# Patient Record
Sex: Female | Born: 1940
Health system: Southern US, Community
[De-identification: ages and names within clinical notes are randomized; demographics above are authoritative.]

## PROBLEM LIST (undated history)

## (undated) DIAGNOSIS — R430 Anosmia: Secondary | ICD-10-CM

## (undated) DIAGNOSIS — D649 Anemia, unspecified: Secondary | ICD-10-CM

## (undated) DIAGNOSIS — M5136 Other intervertebral disc degeneration, lumbar region: Secondary | ICD-10-CM

## (undated) DIAGNOSIS — R9389 Abnormal findings on diagnostic imaging of other specified body structures: Secondary | ICD-10-CM

## (undated) DIAGNOSIS — M6740 Ganglion, unspecified site: Secondary | ICD-10-CM

## (undated) DIAGNOSIS — I2510 Atherosclerotic heart disease of native coronary artery without angina pectoris: Secondary | ICD-10-CM

## (undated) DIAGNOSIS — Z860100 Personal history of colon polyps, unspecified: Secondary | ICD-10-CM

## (undated) DIAGNOSIS — J302 Other seasonal allergic rhinitis: Secondary | ICD-10-CM

## (undated) DIAGNOSIS — J449 Chronic obstructive pulmonary disease, unspecified: Secondary | ICD-10-CM

## (undated) DIAGNOSIS — M1990 Unspecified osteoarthritis, unspecified site: Secondary | ICD-10-CM

## (undated) DIAGNOSIS — M419 Scoliosis, unspecified: Secondary | ICD-10-CM

## (undated) DIAGNOSIS — K4090 Unilateral inguinal hernia, without obstruction or gangrene, not specified as recurrent: Secondary | ICD-10-CM

## (undated) DIAGNOSIS — E785 Hyperlipidemia, unspecified: Secondary | ICD-10-CM

## (undated) DIAGNOSIS — M5440 Lumbago with sciatica, unspecified side: Secondary | ICD-10-CM

## (undated) DIAGNOSIS — K635 Polyp of colon: Secondary | ICD-10-CM

## (undated) DIAGNOSIS — M4800 Spinal stenosis, site unspecified: Secondary | ICD-10-CM

## (undated) DIAGNOSIS — K5730 Diverticulosis of large intestine without perforation or abscess without bleeding: Secondary | ICD-10-CM

## (undated) DIAGNOSIS — I700 Atherosclerosis of aorta: Secondary | ICD-10-CM

## (undated) DIAGNOSIS — J439 Emphysema, unspecified: Secondary | ICD-10-CM

## (undated) DIAGNOSIS — M51369 Other intervertebral disc degeneration, lumbar region without mention of lumbar back pain or lower extremity pain: Secondary | ICD-10-CM

## (undated) DIAGNOSIS — D692 Other nonthrombocytopenic purpura: Secondary | ICD-10-CM

## (undated) DIAGNOSIS — I83893 Varicose veins of bilateral lower extremities with other complications: Secondary | ICD-10-CM

## (undated) DIAGNOSIS — E7800 Pure hypercholesterolemia, unspecified: Secondary | ICD-10-CM

## (undated) HISTORY — DX: Spinal stenosis, site unspecified: M48.00

## (undated) HISTORY — DX: Other seasonal allergic rhinitis: J30.2

## (undated) HISTORY — DX: Anemia, unspecified: D64.9

## (undated) HISTORY — PX: ABDOMINAL HYSTERECTOMY: SHX81

## (undated) HISTORY — PX: BLEPHAROPLASTY: SUR158

## (undated) HISTORY — DX: Atherosclerotic heart disease of native coronary artery without angina pectoris: I25.10

## (undated) HISTORY — DX: Scoliosis, unspecified: M41.9

## (undated) HISTORY — DX: Emphysema, unspecified: J43.9

## (undated) HISTORY — DX: Anosmia: R43.0

## (undated) HISTORY — DX: Personal history of colon polyps, unspecified: Z86.0100

## (undated) HISTORY — DX: Other nonthrombocytopenic purpura: D69.2

## (undated) HISTORY — DX: Pure hypercholesterolemia, unspecified: E78.00

## (undated) HISTORY — DX: Atherosclerosis of aorta: I70.0

## (undated) HISTORY — DX: Lumbago with sciatica, unspecified side: M54.40

## (undated) HISTORY — DX: Ganglion, unspecified site: M67.40

## (undated) HISTORY — DX: Other intervertebral disc degeneration, lumbar region: M51.36

## (undated) HISTORY — DX: Other intervertebral disc degeneration, lumbar region without mention of lumbar back pain or lower extremity pain: M51.369

## (undated) HISTORY — DX: Unilateral inguinal hernia, without obstruction or gangrene, not specified as recurrent: K40.90

## (undated) HISTORY — DX: Personal history of colonic polyps: Z86.010

## (undated) HISTORY — DX: Abnormal findings on diagnostic imaging of other specified body structures: R93.89

## (undated) HISTORY — DX: Chronic obstructive pulmonary disease, unspecified: J44.9

## (undated) HISTORY — DX: Diverticulosis of large intestine without perforation or abscess without bleeding: K57.30

## (undated) HISTORY — PX: TONSILLECTOMY: SUR1361

## (undated) HISTORY — DX: Polyp of colon: K63.5

## (undated) HISTORY — DX: Hyperlipidemia, unspecified: E78.5

## (undated) HISTORY — PX: APPENDECTOMY: SHX54

## (undated) HISTORY — DX: Varicose veins of bilateral lower extremities with other complications: I83.893

---

## 2002-09-01 DIAGNOSIS — C4491 Basal cell carcinoma of skin, unspecified: Secondary | ICD-10-CM

## 2002-09-01 HISTORY — DX: Basal cell carcinoma of skin, unspecified: C44.91

## 2002-12-30 ENCOUNTER — Ambulatory Visit (HOSPITAL_COMMUNITY): Admission: RE | Admit: 2002-12-30 | Discharge: 2002-12-30 | Payer: Self-pay | Admitting: Gastroenterology

## 2002-12-30 ENCOUNTER — Encounter (INDEPENDENT_AMBULATORY_CARE_PROVIDER_SITE_OTHER): Payer: Self-pay | Admitting: Specialist

## 2003-05-17 ENCOUNTER — Emergency Department (HOSPITAL_COMMUNITY): Admission: EM | Admit: 2003-05-17 | Discharge: 2003-05-17 | Payer: Self-pay | Admitting: Emergency Medicine

## 2005-03-09 ENCOUNTER — Emergency Department (HOSPITAL_COMMUNITY): Admission: EM | Admit: 2005-03-09 | Discharge: 2005-03-09 | Payer: Self-pay | Admitting: Emergency Medicine

## 2005-03-14 ENCOUNTER — Ambulatory Visit (HOSPITAL_COMMUNITY): Admission: RE | Admit: 2005-03-14 | Discharge: 2005-03-14 | Payer: Self-pay | Admitting: Orthopedic Surgery

## 2005-03-14 ENCOUNTER — Ambulatory Visit (HOSPITAL_BASED_OUTPATIENT_CLINIC_OR_DEPARTMENT_OTHER): Admission: RE | Admit: 2005-03-14 | Discharge: 2005-03-14 | Payer: Self-pay | Admitting: Orthopedic Surgery

## 2005-10-23 DIAGNOSIS — C4492 Squamous cell carcinoma of skin, unspecified: Secondary | ICD-10-CM

## 2005-10-23 HISTORY — DX: Squamous cell carcinoma of skin, unspecified: C44.92

## 2007-07-29 ENCOUNTER — Encounter: Payer: Self-pay | Admitting: Interventional Cardiology

## 2009-02-08 ENCOUNTER — Encounter: Admission: RE | Admit: 2009-02-08 | Discharge: 2009-04-29 | Payer: Self-pay | Admitting: Family Medicine

## 2009-11-17 ENCOUNTER — Encounter: Payer: Self-pay | Admitting: Interventional Cardiology

## 2009-12-01 ENCOUNTER — Encounter: Payer: Self-pay | Admitting: Interventional Cardiology

## 2009-12-02 ENCOUNTER — Encounter: Admission: RE | Admit: 2009-12-02 | Discharge: 2009-12-23 | Payer: Self-pay | Admitting: Family Medicine

## 2009-12-25 ENCOUNTER — Encounter: Admission: RE | Admit: 2009-12-25 | Discharge: 2010-02-01 | Payer: Self-pay | Admitting: Family Medicine

## 2010-01-11 ENCOUNTER — Encounter: Payer: Self-pay | Admitting: Interventional Cardiology

## 2011-01-23 ENCOUNTER — Other Ambulatory Visit: Payer: Self-pay

## 2011-01-23 DIAGNOSIS — C4492 Squamous cell carcinoma of skin, unspecified: Secondary | ICD-10-CM

## 2011-01-23 HISTORY — DX: Squamous cell carcinoma of skin, unspecified: C44.92

## 2011-04-20 ENCOUNTER — Other Ambulatory Visit: Payer: Self-pay

## 2011-05-12 NOTE — Op Note (Signed)
NAMEJAMESE, TRAUGER                  ACCOUNT NO.:  [id number]   MEDICAL RECORD NO.:  [id number]          PATIENT TYPE:  AMB   LOCATION:  DSC                          FACILITY:  [facility]   PHYSICIAN:  Katy Fitch. Sypher Montez Hageman., M.D.DATE OF BIRTH:  08-16-1941   DATE OF PROCEDURE:  03/14/2005  DATE OF DISCHARGE:                                 OPERATIVE REPORT   PREOPERATIVE DIAGNOSES:  Comminuted, impacted, angulated fracture of right  distal radius, closed.   POSTOPERATIVE DIAGNOSES:  Comminuted, impacted, angulated fracture of right  distal radius, closed.   OPERATION:  Open reduction and internal fixation of a severely comminuted  right distal radius fracture utilizing a seven-peg DVR volar plate system.   SURGEON:  Katy Fitch. Sypher, M.D.   ASSISTANT:  Marveen Reeks. Dasnoit, P.A.-C.   ANESTHESIA:  General by LMA.   SUPERVISING ANESTHESIOLOGIST:  Bedelia Person, M.D.   INDICATIONS:  Ms. Karen Bonilla is a 70 year old woman referred by Dr. Clarene Essex of the colon emergency room staff, for evaluation and management of a  severely- comminuted fracture of the right distal radius sustained on March 10, 2005.   She was seen at the [facility] Emergency Room  where her  fracture was splinted with a sugar-tong splint. She subsequently returned to  our office for follow-up evaluation and management on March 13, 2005, and at  that time was noted to have intact sensibility in her median,  ulnar and  radial distribution. Her splint was not particularly comfortable, therefore  was changed to a custom sugar-tong splint, and arrangements were made for an  open reduction and internal fixation of her fracture at this time.   INFORMED CONSENT:  After an informed consent, during which we discussed the  potential risks and benefits of a volar plate application, including  infection, anesthetic complication and possible failure to heal. She  proceeds with an open reduction and external fixation  of her fracture at  this time.   DESCRIPTION OF PROCEDURE:  Ms. Karen Bonilla is brought to the operating room  and placed in the supine position upon the table.  Following the induction  of general anesthesia by LMA, the right arm was prepped with Betadine soap  and solution and  sterilely draped.  Then 1 gram of Ancef was administered  as IV prophylactic antibiotic.   The procedure commenced with an extended DVR incision paralleling the path  of the flexor carpi radialis in the distal forearm. The subcutaneous tissues  are carefully divided, taking care to electrocauterize the  transverse  veins. The radial sensory branches and branch of the lateral antebrachial  cutaneous nerve were carefully preserved.  The fascia at the  floor of the  flexor carpi radialis sheath was incised longitudinally and the flexor  pollicis longus retracted in an ulnar direction.  The interval between the  radial artery and the flexor pollicis longus was developed, elevating the  pronator quadratus and revealing the comminuted fracture of the distal  radius.  Clot and fibrin were removed from the fracture site, followed by a  manual reduction of the fracture fragments in an  anatomic position.  The  seven-peg DVR  plate was selected and placed on the volar surface of the  fracture. Provisional fixation was provided with a 12 mm bicortical screw.  Once the  fracture was aligned in a satisfactory manner, a combination of  threaded pegs and smooth pegs were applied, reducing the fracture  anatomically.  While there was significant comminution of the metaphysis,  the fragments reassembled rather nicely and the comminuted metaphysis should  heal in a satisfactory manner.  No bone graft was utilized.  The construct  was completed by placement of 14 mm, 12 mm, 12 mm and 12 mm screws in the  cortex, securing the plate to the proximal shaft.  AP, lateral and oblique C-  arm images were obtained documenting satisfactory  length of our cortical  screws, as well as satisfactory position of the pegs, avoiding possible  entry into the fourth dorsal compartment or causing difficulty with the  extensor tendons.  The wound was then lavaged with sterile saline, followed  by a repair of the pronator quadratus with mattress sutures of #0 Vicryl  covering the plate, followed by repair of the skin with subdermal sutures of  #3-0 Vicryl, and intradermal #3-0 Prolene with Steri-Strips.  A voluminous  sugar-tong dressing was applied with the forearm in slight supination.  The tourniquet was released, with immediate capillary refill of all fingers  and the thumb. The tourniquet pressure was 230 mmHg. There were no apparent  complications.   DISPOSITION:  For aftercare, Karen Bonilla is provided a prescription for  Dilaudid 2 milligrams, one p.o. q.4-6 hours p.r.n. pain, 30 tablets without  refill. Also Motrin 600 milligrams, one p.o. q.6h. p.r.n. pain, #30 tablets  without refill, and Keflex 5 milligrams, one p.o. q.8h. x4 days as a  prophylactic antibiotic.      RVS/MEDQ  D:  03/14/2005  T:  03/14/2005  Job:  [id number]

## 2011-08-10 ENCOUNTER — Other Ambulatory Visit: Payer: Self-pay | Admitting: Gastroenterology

## 2011-11-15 ENCOUNTER — Ambulatory Visit: Payer: Medicare Other | Attending: Family Medicine | Admitting: Physical Therapy

## 2011-11-15 DIAGNOSIS — IMO0001 Reserved for inherently not codable concepts without codable children: Secondary | ICD-10-CM | POA: Insufficient documentation

## 2011-11-15 DIAGNOSIS — M2560 Stiffness of unspecified joint, not elsewhere classified: Secondary | ICD-10-CM | POA: Insufficient documentation

## 2011-11-15 DIAGNOSIS — M545 Low back pain, unspecified: Secondary | ICD-10-CM | POA: Insufficient documentation

## 2011-11-15 DIAGNOSIS — M25559 Pain in unspecified hip: Secondary | ICD-10-CM | POA: Insufficient documentation

## 2011-11-21 ENCOUNTER — Ambulatory Visit: Payer: Medicare Other | Admitting: Physical Therapy

## 2011-11-27 ENCOUNTER — Ambulatory Visit: Payer: Medicare Other | Attending: Family Medicine | Admitting: Physical Therapy

## 2011-11-27 DIAGNOSIS — M545 Low back pain, unspecified: Secondary | ICD-10-CM | POA: Insufficient documentation

## 2011-11-27 DIAGNOSIS — IMO0001 Reserved for inherently not codable concepts without codable children: Secondary | ICD-10-CM | POA: Insufficient documentation

## 2011-11-27 DIAGNOSIS — M2560 Stiffness of unspecified joint, not elsewhere classified: Secondary | ICD-10-CM | POA: Insufficient documentation

## 2011-11-27 DIAGNOSIS — M25559 Pain in unspecified hip: Secondary | ICD-10-CM | POA: Insufficient documentation

## 2011-11-29 ENCOUNTER — Ambulatory Visit: Payer: Medicare Other

## 2011-12-04 ENCOUNTER — Ambulatory Visit: Payer: Medicare Other

## 2011-12-06 ENCOUNTER — Ambulatory Visit: Payer: Medicare Other

## 2011-12-11 ENCOUNTER — Ambulatory Visit: Payer: Medicare Other

## 2011-12-13 ENCOUNTER — Ambulatory Visit: Payer: Medicare Other

## 2012-01-04 ENCOUNTER — Other Ambulatory Visit: Payer: Self-pay | Admitting: Orthopedic Surgery

## 2012-01-11 ENCOUNTER — Ambulatory Visit (HOSPITAL_BASED_OUTPATIENT_CLINIC_OR_DEPARTMENT_OTHER)
Admission: RE | Admit: 2012-01-11 | Discharge: 2012-01-11 | Disposition: A | Discharge status: Home / Self Care | Payer: Medicare Other | Source: Ambulatory Visit | Attending: Orthopedic Surgery | Admitting: Orthopedic Surgery

## 2012-01-11 ENCOUNTER — Encounter (HOSPITAL_BASED_OUTPATIENT_CLINIC_OR_DEPARTMENT_OTHER)
Admission: RE | Disposition: A | Discharge status: Home / Self Care | Payer: Self-pay | Source: Ambulatory Visit | Attending: Orthopedic Surgery

## 2012-01-11 ENCOUNTER — Encounter (HOSPITAL_BASED_OUTPATIENT_CLINIC_OR_DEPARTMENT_OTHER): Payer: Self-pay | Admitting: Orthopedic Surgery

## 2012-01-11 DIAGNOSIS — M6740 Ganglion, unspecified site: Secondary | ICD-10-CM | POA: Insufficient documentation

## 2012-01-11 DIAGNOSIS — M24049 Loose body in unspecified finger joint(s): Secondary | ICD-10-CM | POA: Insufficient documentation

## 2012-01-11 HISTORY — PX: MASS EXCISION: SHX2000

## 2012-01-11 SURGERY — MINOR EXCISION OF MASS
Anesthesia: LOCAL | Site: Hand | Laterality: Right | Wound class: Clean

## 2012-01-11 MED ORDER — LIDOCAINE HCL 2 % IJ SOLN
INTRAMUSCULAR | Status: DC | PRN
  Administered 2012-01-11: 3.5 mL

## 2012-01-11 MED ORDER — CEPHALEXIN 500 MG PO CAPS: 500.0000 mg | ORAL_CAPSULE | Freq: Three times a day (TID) | ORAL | Status: AC

## 2012-01-11 MED ORDER — HYDROCODONE-ACETAMINOPHEN 5-325 MG PO TABS: ORAL_TABLET | ORAL | Status: AC

## 2012-01-11 SURGICAL SUPPLY — 34 items
BANDAGE ADHESIVE 1X3 (GAUZE/BANDAGES/DRESSINGS) IMPLANT
BLADE SURG 15 STRL LF DISP TIS (BLADE) ×1 IMPLANT
BLADE SURG 15 STRL SS (BLADE) ×1
BNDG COHESIVE 1X5 TAN STRL LF (GAUZE/BANDAGES/DRESSINGS) ×2 IMPLANT
BNDG ELASTIC 2 VLCR STRL LF (GAUZE/BANDAGES/DRESSINGS) IMPLANT
BNDG ESMARK 4X9 LF (GAUZE/BANDAGES/DRESSINGS) IMPLANT
BRUSH SCRUB EZ PLAIN DRY (MISCELLANEOUS) ×2 IMPLANT
CLOTH BEACON ORANGE TIMEOUT ST (SAFETY) ×2 IMPLANT
CORDS BIPOLAR (ELECTRODE) IMPLANT
COVER MAYO STAND STRL (DRAPES) ×2 IMPLANT
CUFF TOURNIQUET SINGLE 18IN (TOURNIQUET CUFF) IMPLANT
DECANTER SPIKE VIAL GLASS SM (MISCELLANEOUS) ×2 IMPLANT
DRAIN PENROSE 1/2X12 LTX STRL (WOUND CARE) ×2 IMPLANT
DRAPE SURG 17X23 STRL (DRAPES) ×2 IMPLANT
GAUZE SPONGE 4X4 12PLY STRL LF (GAUZE/BANDAGES/DRESSINGS) ×4 IMPLANT
GAUZE XEROFORM 1X8 LF (GAUZE/BANDAGES/DRESSINGS) ×2 IMPLANT
GLOVE BIO SURGEON STRL SZ 6.5 (GLOVE) ×4 IMPLANT
GLOVE BIOGEL M STRL SZ7.5 (GLOVE) ×2 IMPLANT
GLOVE ORTHO TXT STRL SZ7.5 (GLOVE) ×2 IMPLANT
GOWN PREVENTION PLUS XLARGE (GOWN DISPOSABLE) ×2 IMPLANT
NEEDLE 27GAX1X1/2 (NEEDLE) ×2 IMPLANT
PACK BASIN DAY SURGERY FS (CUSTOM PROCEDURE TRAY) ×2 IMPLANT
PADDING CAST ABS 4INX4YD NS (CAST SUPPLIES) ×1
PADDING CAST ABS COTTON 4X4 ST (CAST SUPPLIES) ×1 IMPLANT
SPONGE GAUZE 4X4 12PLY (GAUZE/BANDAGES/DRESSINGS) ×2 IMPLANT
STOCKINETTE 4X48 STRL (DRAPES) ×2 IMPLANT
SUT ETHILON 5 0 P 3 18 (SUTURE) ×1
SUT NYLON ETHILON 5-0 P-3 1X18 (SUTURE) ×1 IMPLANT
SYR 3ML 23GX1 SAFETY (SYRINGE) IMPLANT
SYR CONTROL 10ML LL (SYRINGE) ×2 IMPLANT
TOWEL OR 17X24 6PK STRL BLUE (TOWEL DISPOSABLE) ×4 IMPLANT
TRAY DSU PREP LF (CUSTOM PROCEDURE TRAY) ×2 IMPLANT
UNDERPAD 30X30 INCONTINENT (UNDERPADS AND DIAPERS) ×2 IMPLANT
WATER STERILE IRR 1000ML POUR (IV SOLUTION) ×2 IMPLANT

## 2012-01-11 NOTE — Op Note (Signed)
NAME:  Karen Bonilla, Karen Bonilla                       ACCOUNT NO.:  MEDICAL RECORD NO.:  [id number]  LOCATION:                                 FACILITY:  PHYSICIAN:  Katy Fitch. Jung Ingerson, M.D. DATE OF BIRTH:  1941/08/18  DATE OF PROCEDURE:  01/11/2012 DATE OF DISCHARGE:                              OPERATIVE REPORT   PREOPERATIVE DIAGNOSIS:  Large draining mucoid cyst, dorsal ulnar aspect of right index finger distal interphalangeal joint with x-ray evaluation preoperatively documenting intra-articular loose bodies at distal interphalangeal joint and significant degenerative arthritis.  POSTOPERATIVE DIAGNOSIS:  Large draining mucoid cyst, dorsal ulnar aspect of right index finger distal interphalangeal joint with x-ray evaluation preoperatively documenting intra-articular loose bodies at distal interphalangeal joint and significant degenerative arthritis with confirmation of loose bodies with removal of an ostial cartilaginous loose body and several loose osteophyte fragments.  OPERATIONS: 1. Right index finger DIP joint arthrotomy with synovectomy and     removal of loose bodies and marginal osteophytes. 2. Excision of mucoid cyst, dorsal ulnar aspect of right index finger.  SURGEON:  Katy Fitch. Aris Moman, MD  ASSISTANT:  Marveen Reeks Dasnoit, PA-C  ANESTHESIA:  2% lidocaine metacarpal head level block of right index finger.  This was performed as a minor operating room procedure.  ANESTHETIST:  Katy Fitch. Uno Esau, MD.  Total volume of lidocaine 3.5 mL of 2% without epinephrine.  INDICATIONS:  Karen Bonilla is a 71 year old woman referred through the courtesy of Dr. Janalyn Harder and her primary care physician, Camillo Flaming, for evaluation and management of an enlarging draining mass in the dorsal ulnar aspect of right index finger.  This was noted to be a mucoid cyst that has ruptured.  We advised her to avoid rupture possible.  There was no sign of infection.  X-ray of the finger revealed loose  bodies within the DIP joint and advanced degenerative arthritis.  We advised Karen Bonilla to proceed with debridement of the joint and removal of loose bodies.  She knows we cannot change the natural history of her arthritis.  With debridement, we typically can resolve the mucoid cyst.  After informed consent, she was brought to the operating at this time.  PROCEDURE:  Karen Bonilla was brought to room #1 of the [facility] and placed in supine position on the operating table.  Following detailed informed consent and Betadine prep, a 3.5 mL of 2% plain lidocaine were infiltrated at the metacarpal head level in the flexor sheath and around the digital nerves to obtain a digital block.  After 10 minutes, excellent anesthesia was achieved.  The right hand and arm were then prepped with Betadine soap solution and sterilely draped.  Following routine surgical time-out, the right index finger was exsanguinated with a gauze wrap and a 0.5 inch Penrose drain placed over the proximal phalangeal segment as a digital tourniquet.  Procedure commenced with a curvilinear incision incorporating the distal cyst. The cyst was circumferentially dissected and removed.  This had quite a bit of inspissated myxoid material.  An arthrotomy was performed in the dorsal ulnar aspect of the DIP joint. The capsule was resected between the terminal  extensor tendon slip and the ulnar collateral ligament.  A synovectomy of the dorsal aspect of the joint was accomplished followed by removal of a 1.5-mm ostial cartilaginous loose body with rongeur.  A curette was used to remove marginal osteophytes.  There was a loose osteophyte noted at the ulnar base of the distal phalanx.  Thereafter, the wound was thoroughly irrigated with a 19-gauge needle with sterile saline.  The wound was then repaired with mattress suture of 5-0 nylon.  Compression dressing was applied with Xeroflo, sterile gauze, and a Coban  dressing.  There were no apparent complications.  For aftercare, Karen Bonilla was provided prescriptions for Keflex 500 mg 1 p.o. q.8 hours x4 days as a prophylactic antibiotic due to joint entry, also hydrocodone 5 mg 1 p.o. q.4 hours p.r.n. pain, 20 tablets without refill.     Katy Fitch Mykel Mohl, M.D.     RVS/MEDQ  D:  01/11/2012  T:  01/11/2012  Job:  [id number]  cc:   Ria Bush. Jorja Loa, M.D. Dossie Der, MD

## 2012-01-11 NOTE — Op Note (Signed)
OP NOTE DICTATED:  01/11/12 [id number]

## 2012-01-11 NOTE — Brief Op Note (Signed)
01/11/2012  9:39 AM  PATIENT:  Karen Bonilla  71 y.o. female  PRE-OPERATIVE DIAGNOSIS:  mucoid cyst right index with loose bodies in joint  POST-OPERATIVE DIAGNOSIS:  Mucoid cyst with DJD and loose bodies in joint  PROCEDURE:  Procedure(s):DIP JOINT ARTHROTOMY, REMOVAL OF LOOSE BODIES, SYNOVECTOMY AND MUCOID CYST EXCISION RIGHT INDEX FINGER   SURGEON:  Surgeon(s): Wyn Forster., MD  PHYSICIAN ASSISTANT:   ASSISTANTS: Mallory Shirk.A-C    ANESTHESIA:   local  EBL:     BLOOD ADMINISTERED:none  DRAINS: none   LOCAL MEDICATIONS USED:  LIDOCAINE 3.5 CC 2%  SPECIMEN:  No Specimen  DISPOSITION OF SPECIMEN:  N/A  COUNTS:  YES  TOURNIQUET:  * No tourniquets in log *  DICTATION: .Other Dictation: Dictation Number [id number]  PLAN OF CARE: Discharge to home after PACU  PATIENT DISPOSITION:  PACU - hemodynamically stable.

## 2012-01-11 NOTE — H&P (Signed)
  Karen Bonilla is an 71 y.o. female.   Chief Complaint: Mucoid cyst right index finger DIP joint  HPI: Patient is a 71 year old right-hand-dominant female who is referred to [place] for evaluation and treatment of a long-standing mucoid cyst of the DIP joint of the right index finger. She relates no history of trauma to the finger. This has increased and decreased in size over the past several months. She wishes to have this surgically excised.  No past medical history on file.  No past surgical history on file.  No family history on file. Social History:  does not have a smoking history on file. She does not have any smokeless tobacco history on file. Her alcohol and drug histories not on file.  Allergies: Allergies not on file  No current facility-administered medications on file as of .   No current outpatient prescriptions on file as of .    No results found for this or any previous visit (from the past 48 hour(s)).  No results found.   Pertinent items are noted in HPI.  There were no vitals taken for this visit.  General appearance: alert Head: Normocephalic, without obvious abnormality Neck: supple, symmetrical, trachea midline Resp: clear to auscultation bilaterally Cardio: regular rate and rhythm, S1, S2 normal, no murmur, click, rub or gallop GI: normal findings: bowel sounds normal Extremities: Right index finger reveals a mucoid cyst on the dorsum of the index finger at the DIP joint. There is mild grooving of the nail plate this does not appear to be infected. She has good motion of her PIP and DIP joint. The DIP joint is moderately tender to palpation. Pulses: 2+ and symmetric Skin: normal Neurologic: Grossly normal    Assessment/Plan impression: Right index finger DIP joint mucoid cyst  Plan: Patient to be taken to the operating room to undergo DIP joint mucoid cyst excision and debridement of the DIP joint. This will be performed under local anesthesia. The  procedure risks benefits and postoperative course were discussed with the patient at length and she was in agreement with this plan.  DASNOIT,Huck Ashworth J 01/11/2012, 7:52 AM   H&P documentation: 01/11/2012  -History and Physical Reviewed  -Patient has been re-examined  -No change in the plan of care  Wyn Forster, MD

## 2012-01-12 ENCOUNTER — Encounter (HOSPITAL_BASED_OUTPATIENT_CLINIC_OR_DEPARTMENT_OTHER): Payer: Self-pay | Admitting: Orthopedic Surgery

## 2012-01-31 ENCOUNTER — Other Ambulatory Visit: Payer: Self-pay | Admitting: Physician Assistant

## 2012-02-19 ENCOUNTER — Other Ambulatory Visit: Payer: Self-pay

## 2012-02-19 DIAGNOSIS — I83893 Varicose veins of bilateral lower extremities with other complications: Secondary | ICD-10-CM

## 2012-04-09 ENCOUNTER — Encounter: Payer: Medicare Other | Admitting: Vascular Surgery

## 2012-05-17 ENCOUNTER — Encounter: Payer: Self-pay | Admitting: Vascular Surgery

## 2012-05-21 ENCOUNTER — Ambulatory Visit (INDEPENDENT_AMBULATORY_CARE_PROVIDER_SITE_OTHER): Payer: Medicare Other | Admitting: Vascular Surgery

## 2012-05-21 ENCOUNTER — Encounter: Payer: Self-pay | Admitting: Vascular Surgery

## 2012-05-21 VITALS — BP 132/72 | HR 54 | Resp 16 | Ht 67.0 in | Wt 163.0 lb

## 2012-05-21 DIAGNOSIS — I83893 Varicose veins of bilateral lower extremities with other complications: Secondary | ICD-10-CM

## 2012-05-21 DIAGNOSIS — M79609 Pain in unspecified limb: Secondary | ICD-10-CM

## 2012-05-21 HISTORY — DX: Varicose veins of bilateral lower extremities with other complications: I83.893

## 2012-05-21 NOTE — Progress Notes (Signed)
Subjective:     Patient ID: Karen Bonilla, female   DOB: 1941/02/19, 71 y.o.   MRN: [id number]  HPI this 71 year old female is evaluated today for symptomatic varicose veins in the right lower extremity. Over the past year she has noted increasing bulging varicosities in the right posterior calf and medial calf area. She has also noticed swelling in the ankle as the day progresses. She does not wear elastic compression stockings nor elevate her legs on a regular basis. She does take ibuprofen for other reasons. She has no history of DVT, thrombophlebitis, or stasis ulcers but has had a bleeding episode in the right lateral lower leg following shaving which resulted in quite brisk bleeding.  History reviewed. No pertinent past medical history.  History  Substance Use Topics  . Smoking status: Current Everyday Smoker -- 1.0 packs/day    Types: Cigarettes  . Smokeless tobacco: Not on file  . Alcohol Use: 3.0 oz/week    5 Glasses of wine per week    History reviewed. No pertinent family history.  No Known Allergies  No current outpatient prescriptions on file.  BP 132/72  Pulse 54  Resp 16  Ht 5\' 7"  (1.702 m)  Wt 163 lb (73.936 kg)  BMI 25.53 kg/m2  Body mass index is 25.53 kg/(m^2).           Review of Systems denies chest pain, dyspnea on exertion, PND, orthopnea,, phthisis, claudication     Objective:   Physical Exam blood pressure 130/72 heart rate 84 respirations 16 Gen.-alert and oriented x3 in no apparent distress HEENT normal for age Lungs no rhonchi or wheezing Cardiovascular regular rhythm no murmurs carotid pulses 3+ palpable no bruits audible Abdomen soft nontender no palpable masses Musculoskeletal free of  major deformities Skin clear -no rashes Neurologic normal Lower extremities 3+ femoral and dorsalis pedis pulses palpable bilaterally with no edema in the left leg 1+ edema right ankle. She has multiple bulging varicosities in the proximal medial calf  area particularly posteriorly as well as large patches of reticular veins in the right medial calf and spider veins in the distal medial thigh area. No active ulcers are noted. Left leg is free of obvious varicosities.  Today I ordered a venous duplex exam of both legs which are reviewed and interpreted. There is no DVT. The right great saphenous vein has gross reflux from the saphenofemoral junction to the knee. Left leg has intermittent areas of reflux but not consistently and the pain is not large caliber.       Assessment:     Venous insufficiency right leg with gross reflux right great saphenous vein due to a valvular incompetence with secondary bulging varicosities which are quite symptomatic    Plan:     #1 long-leg elastic compression stockings 20-30 mm gradient #2 elevate legs as much as possible during day #3 continue ibuprofen as she is taking #4 return in 3 months-if no significant improvement would recommend #1 laser ablation right great saphenous vein and then return in 3 months to see if stab phlebectomy is necessary

## 2012-05-21 NOTE — Progress Notes (Signed)
Bilateral LE venous reflux duplex performed 05/21/2012 @ [facility]

## 2012-05-27 NOTE — Procedures (Unsigned)
LOWER EXTREMITY VENOUS REFLUX EXAM  INDICATION:  Bilateral lower extremity varicose veins, pain  EXAM:  Using color-flow imaging and pulse Doppler spectral analysis, the bilateral common femoral, femoral, popliteal, posterior tibial, great and small saphenous veins were evaluated.  There is evidence suggesting deep venous insufficiency in the bilateral lower extremities.  The right saphenofemoral junction is not competent with reflux of >500 milliseconds.  The left saphenofemoral junction is competent.  The bilateral GSV's are not competent with reflux of >500 milliseconds with the caliber as described below.  The right proximal small saphenous vein demonstrates incompetency with diameter measurements ranging from 0.31 cm to 0.38 cm.  The left proximal small saphenous vein demonstrates competency.  GSV Diameter (used if found to be incompetent only)                                                      Right      Left Proximal Greater Saphenous Vein                      0.88 cm    0.59 cm Proximal-to-mid-thigh                                0.49 cm    0.41 cm Mid thigh                                            0.52 cm    0.36 cm Mid-distal thigh                                     cm         cm Distal thigh                                         0.63 cm    0.33 cm Knee                                                 0.34 cm    0.32 cm  IMPRESSION: 1. The bilateral great saphenous veins are not competent with reflux     of >500 milliseconds. 2. The bilateral great saphenous veins are not tortuous. 3. The deep venous system bilaterally is not competent with reflux of     >500 milliseconds. 4. The right small saphenous vein is not competent with reflux of >500     milliseconds. 5. The left small saphenous vein is competent. 6. Note is made of a right competent perforator measuring 0.29 cm     arising from the great saphenous vein terminating into one of the  posterior tibial veins at the mid calf segment.  ___________________________________________ Quita Skye. Hart Rochester, M.D.  SH/MEDQ  D:  05/21/2012  T:  05/21/2012  Job:  [id number]

## 2012-08-20 ENCOUNTER — Ambulatory Visit: Payer: Medicare Other | Admitting: Vascular Surgery

## 2012-09-27 ENCOUNTER — Encounter: Payer: Self-pay | Admitting: Vascular Surgery

## 2012-09-30 ENCOUNTER — Ambulatory Visit (INDEPENDENT_AMBULATORY_CARE_PROVIDER_SITE_OTHER): Payer: Medicare Other | Admitting: Vascular Surgery

## 2012-09-30 ENCOUNTER — Encounter: Payer: Self-pay | Admitting: Vascular Surgery

## 2012-09-30 VITALS — BP 141/81 | HR 69 | Resp 18 | Ht 67.0 in | Wt 164.0 lb

## 2012-09-30 DIAGNOSIS — I83893 Varicose veins of bilateral lower extremities with other complications: Secondary | ICD-10-CM

## 2012-09-30 NOTE — Progress Notes (Signed)
Subjective:     Patient ID: Karen Bonilla, female   DOB: 1941-04-16, 71 y.o.   MRN: [id number]  HPI this 71 year old female returns for continued followup regarding her severe venous insufficiency of the right leg. She has bulging varicosities in the distal thigh and medial calf area which cause aching throbbing and burning discomfort and also has had another episode of bleeding from a spider veins in the lateral aspect of her right ankle area which occurred while shaving in the shower. She has no history of DVT or thrombophlebitis. She is were long light elastic compression stockings if no improvement in her symptomatology. She's also tried elevation and ibuprofen. These symptoms are affecting her daily living.  Past Medical History  Diagnosis Date  . Hyperlipidemia     History  Substance Use Topics  . Smoking status: Current Every Day Smoker -- 1.0 packs/day    Types: Cigarettes  . Smokeless tobacco: Never Used  . Alcohol Use: 3.0 oz/week    5 Glasses of wine per week    Family History  Problem Relation Age of Onset  . Hypertension Mother   . Other Father     varicose veins  . Hypertension Father     No Known Allergies  Current outpatient prescriptions:pravastatin (PRAVACHOL) 20 MG tablet, Take 20 mg by mouth daily., Disp: , Rfl:   BP 141/81  Pulse 69  Resp 18  Ht 5\' 7"  (1.702 m)  Wt 164 lb (74.39 kg)  BMI 25.69 kg/m2  Body mass index is 25.69 kg/(m^2).          Review of Systems denies chest pain, dyspnea on exertion, PND, orthopnea, hemoptysis    Objective:   Physical Exam blood pressure 141/81 heart rate 69 respirations 18 General well-developed well-nourished female in no apparent stress alert and oriented x3 Lungs no rhonchi or wheezing Right lower extremity with bulging varicosities along the course of the great saphenous vein beginning in the distal thigh extending to the ankle area. She has prominent spider veins medially and laterally lower third of the  leg and 1+ edema with no active ulceration.    Assessment:     Severe venous insufficiency with gross reflux right great saphenous vein causing symptomatic bulging varicosities and 2 episodes of bleeding from spider veins and ankle area-affecting patient's daily living    Plan:     Patient needs #1 laser ablation right great saphenous vein. She will then return in 3 months to see if sclerotherapy and or stab phlebectomy indicated for secondary varicosities and spider veins area where bleeding previously occurred. Will proceed with precertification to perform this in near future

## 2012-10-02 ENCOUNTER — Other Ambulatory Visit: Payer: Self-pay | Admitting: *Deleted

## 2012-10-02 DIAGNOSIS — I83893 Varicose veins of bilateral lower extremities with other complications: Secondary | ICD-10-CM

## 2012-10-14 ENCOUNTER — Other Ambulatory Visit: Payer: Medicare Other | Admitting: Vascular Surgery

## 2012-10-25 ENCOUNTER — Encounter: Payer: Self-pay | Admitting: Vascular Surgery

## 2012-10-28 ENCOUNTER — Ambulatory Visit (INDEPENDENT_AMBULATORY_CARE_PROVIDER_SITE_OTHER): Payer: Medicare Other | Admitting: Vascular Surgery

## 2012-10-28 ENCOUNTER — Encounter: Payer: Self-pay | Admitting: Vascular Surgery

## 2012-10-28 VITALS — BP 143/73 | HR 67 | Resp 16 | Ht 67.0 in | Wt 164.0 lb

## 2012-10-28 DIAGNOSIS — I83893 Varicose veins of bilateral lower extremities with other complications: Secondary | ICD-10-CM

## 2012-10-28 NOTE — Progress Notes (Signed)
Laser Ablation Procedure      Date: 10/28/2012    [name] DOB:1941/02/10  Consent signed: Yes  Surgeon:J.D. Hart Rochester  Procedure: Laser Ablation: right Greater Saphenous Vein  BP 143/73  Pulse 67  Resp 16  Ht 5\' 7"  (1.702 m)  Wt 164 lb (74.39 kg)  BMI 25.69 kg/m2  Start time: 11:10   End time: 11:50  Tumescent Anesthesia: 250 cc 0.9% NaCl with 50 cc Lidocaine HCL with 1% Epi and 15 cc 8.4% NaHCO3  Local Anesthesia: 4 cc Lidocaine HCL and NaHCO3 (ratio 2:1)  Pulsed mode: Watts 15 Seconds 1 Pulses:1 Total Pulses:121 Total Energy: 1805 Total Time: 2:00     Patient tolerated procedure well: Yes  Notes:   Description of Procedure:  After marking the course of the saphenous vein and the secondary varicosities in the standing position, the patient was placed on the operating table in the supine position, and the right leg was prepped and draped in sterile fashion. Local anesthetic was administered, and under ultrasound guidance the saphenous vein was accessed with a micro needle and guide wire; then the micro puncture sheath was placed. A guide wire was inserted to the saphenofemoral junction, followed by a 5 french sheath.  The position of the sheath and then the laser fiber below the junction was confirmed using the ultrasound and visualization of the aiming beam.  Tumescent anesthesia was administered along the course of the saphenous vein using ultrasound guidance. Protective laser glasses were placed on the patient, and the laser was fired at 15 watt pulsed mode advancing 1-2 mm per sec.  For a total of 1805 joules.  A steri strip was applied to the puncture site.    ABD pads and thigh high compression stockings were applied.  Ace wrap bandages were applied over the phlebectomy sites and at the top of the saphenofemoral junction.  Blood loss was less than 15 cc.  The patient ambulated out of the operating room having tolerated the procedure well.

## 2012-10-28 NOTE — Progress Notes (Signed)
Subjective:     Patient ID: Karen Bonilla, female   DOB: 02-22-1941, 71 y.o.   MRN: [id number]  HPI this 71 year old female had laser ablation of the right great saphenous vein performed under local tumescent anesthesia today. A total of 1805 J of energy was utilized. She tolerated the procedure well there  Review of Systems     Objective:   Physical ExamBP 143/73  Pulse 67  Resp 16  Ht 5\' 7"  (1.702 m)  Wt 164 lb (74.39 kg)  BMI 25.69 kg/m2      Assessment:     Well-tolerated laser ablation right great saphenous vein done for gross reflux right great saphenous system painful varicosities and venous hypertension. This was performed under local tumescent anesthesia with 1805 J of energy utilized    Plan:     Return in one week for venous duplex exam to confirm closure right great saphenous vein. Patient will then return in 3 months to see if stab phlebectomy of secondary varicosities in sclerotherapy of area where bleeding occurred will be indicated

## 2012-10-29 ENCOUNTER — Telehealth: Payer: Self-pay | Admitting: *Deleted

## 2012-10-29 NOTE — Telephone Encounter (Signed)
Left a message asking her to call me if she had any questions or concerns.

## 2012-11-01 ENCOUNTER — Encounter: Payer: Self-pay | Admitting: Vascular Surgery

## 2012-11-04 ENCOUNTER — Encounter (INDEPENDENT_AMBULATORY_CARE_PROVIDER_SITE_OTHER): Payer: Medicare Other | Admitting: *Deleted

## 2012-11-04 ENCOUNTER — Ambulatory Visit (INDEPENDENT_AMBULATORY_CARE_PROVIDER_SITE_OTHER): Payer: Medicare Other | Admitting: Vascular Surgery

## 2012-11-04 ENCOUNTER — Encounter: Payer: Self-pay | Admitting: Vascular Surgery

## 2012-11-04 VITALS — BP 148/80 | HR 68 | Resp 16 | Ht 67.0 in | Wt 172.0 lb

## 2012-11-04 DIAGNOSIS — I83893 Varicose veins of bilateral lower extremities with other complications: Secondary | ICD-10-CM

## 2012-11-04 DIAGNOSIS — Z48812 Encounter for surgical aftercare following surgery on the circulatory system: Secondary | ICD-10-CM

## 2012-11-04 NOTE — Progress Notes (Signed)
Subjective:     Patient ID: Karen Bonilla, female   DOB: 1941-08-06, 71 y.o.   MRN: [id number]  HPI this 71 year old female returns 1 week post laser ablation right great saphenous vein for painful varicosities and venous hypertension. She has had no pain following this procedure either in the thigh or calf. She's had no distal edema. She states that she notes that the varicosities in her leg below the knee are much less tense. She has been wearing elastic compression stockings 20-30 mm gradient.  Past Medical History  Diagnosis Date  . Hyperlipidemia     History  Substance Use Topics  . Smoking status: Current Every Day Smoker -- 1.0 packs/day    Types: Cigarettes  . Smokeless tobacco: Never Used  . Alcohol Use: 3.0 oz/week    5 Glasses of wine per week    Family History  Problem Relation Age of Onset  . Hypertension Mother   . Other Father     varicose veins  . Hypertension Father     No Known Allergies  Current outpatient prescriptions:aspirin 81 MG tablet, Take 81 mg by mouth every other day., Disp: , Rfl: ;  pravastatin (PRAVACHOL) 20 MG tablet, Take 20 mg by mouth daily., Disp: , Rfl:   BP 148/80  Pulse 68  Resp 16  Ht 5\' 7"  (1.702 m)  Wt 172 lb (78.019 kg)  BMI 26.94 kg/m2  Body mass index is 26.94 kg/(m^2).           Review of Systems denies chest pain, dyspnea on exertion, PND, orthopnea, hemoptysis, claudication.     Objective:   Physical Exam blood pressure 140 at rate heart rate 68 respirations 16 General well-developed well-nourished female in no apparent stress alert and oriented x3 Lungs no rhonchi or wheezing Cardiovascular regular and no murmurs carotid pulses 3+ no audible bruits Right lower extremity with 3+ femoral dorsalis pedis pulse palpable. Minimal tenderness along the course of the great saphenous vein from the knee to the saphenofemoral junction. The varicosities in the posterior calf or less tense than preoperatively.  Today I  ordered a venous duplex exam of the right leg which are reviewed and interpreted. Great saphenous vein is totally occluded from the knee to the saphenofemoral junction and there is no DVT    Assessment:     Successful laser ablation right great saphenous vein for venous hypertension and painful varicosities    Plan:     Return in 3 months to see if stab phlebectomy or sclerotherapy indicated for secondary varicosities if patient remains symptomatic We'll continue wearing long light elastic compression 20-30 mm gradient

## 2013-01-31 ENCOUNTER — Encounter: Payer: Self-pay | Admitting: Vascular Surgery

## 2013-02-03 ENCOUNTER — Ambulatory Visit (INDEPENDENT_AMBULATORY_CARE_PROVIDER_SITE_OTHER): Payer: Medicare Other | Admitting: Vascular Surgery

## 2013-02-03 ENCOUNTER — Encounter: Payer: Self-pay | Admitting: Vascular Surgery

## 2013-02-03 VITALS — BP 126/86 | HR 75 | Resp 20 | Ht 67.0 in | Wt 162.0 lb

## 2013-02-03 DIAGNOSIS — I83893 Varicose veins of bilateral lower extremities with other complications: Secondary | ICD-10-CM

## 2013-02-03 NOTE — Progress Notes (Addendum)
Subjective:     Patient ID: Karen Bonilla, female   DOB: 07/22/1941, 72 y.o.   MRN: [id number]  HPIthis 72 year old female returns today for further discussion regarding her venous insufficiency of the right leg. She had laser ablation performed 3 months ago for painful varicosities. She has had less discomfort than previously and less tightness of the varicosities in the posterior calf but she has continued to have itching and stinging discomfort with mild edema distally. She has no history of DVT or thrombophlebitis. She is continuing to wear her long leg elastic compression stockings 20-30 mm gradient.  Past Medical History  Diagnosis Date  . Hyperlipidemia     History  Substance Use Topics  . Smoking status: Current Every Day Smoker -- 1.00 packs/day    Types: Cigarettes  . Smokeless tobacco: Never Used  . Alcohol Use: 3.0 oz/week    5 Glasses of wine per week    Family History  Problem Relation Age of Onset  . Hypertension Mother   . Other Father     varicose veins  . Hypertension Father     No Known Allergies  Current outpatient prescriptions:aspirin 81 MG tablet, Take 81 mg by mouth every other day., Disp: , Rfl: ;  pravastatin (PRAVACHOL) 20 MG tablet, Take 20 mg by mouth daily., Disp: , Rfl:   BP 126/86  Pulse 75  Resp 20  Ht 5\' 7"  (1.702 m)  Wt 162 lb (73.483 kg)  BMI 25.37 kg/m2  Body mass index is 25.37 kg/(m^2).           Review of SystemsDenies chest pain, dyspnea on exertion, PND, orthopnea, hemoptysis, claudication     Objective:   Physical Exam BP 126/86  Pulse 75  Resp 20  Ht 5\' 7"  (1.702 m)  Wt 162 lb (73.483 kg)  BMI 25.37 kg/m2  General well-developed well-nourished female in no apparent stress alert and oriented x3 Lungs no rhonchi or wheezing Cardiovascular regular rhythm no murmurs Lower extremity with 3+ femoral and dorsalis pedis pulse palpable. There are persistent varicosities in the posterior calf which connect to the great  saphenous system at the knee level. These are less tense and less bulging than previously. There also spider veins on the medial distal thigh of the right leg and near the lateral malleolus where she has had previous bleeding while shaving.these areas of concern are 0.3 mm in diameter      Assessment:     Residual varicosities and spider veins which have bled in the past while shaving her legs status post laser ablation right great saphenous vein     Plan:     Believe patient should have 2 courses of sclerotherapy for these residual bulging varicosities and spider veins to hopefully prevent further episodes of bleeding and pain Will proceed with precertification to perform this in the near future

## 2013-03-19 ENCOUNTER — Emergency Department (HOSPITAL_COMMUNITY): Payer: Medicare Other

## 2013-03-19 ENCOUNTER — Encounter (HOSPITAL_COMMUNITY): Payer: Self-pay | Admitting: Emergency Medicine

## 2013-03-19 ENCOUNTER — Emergency Department (HOSPITAL_COMMUNITY)
Admission: EM | Admit: 2013-03-19 | Discharge: 2013-03-19 | Disposition: A | Discharge status: Home / Self Care | Payer: Medicare Other | Attending: Emergency Medicine | Admitting: Emergency Medicine

## 2013-03-19 DIAGNOSIS — Z7982 Long term (current) use of aspirin: Secondary | ICD-10-CM | POA: Insufficient documentation

## 2013-03-19 DIAGNOSIS — F17200 Nicotine dependence, unspecified, uncomplicated: Secondary | ICD-10-CM | POA: Insufficient documentation

## 2013-03-19 DIAGNOSIS — S82402A Unspecified fracture of shaft of left fibula, initial encounter for closed fracture: Secondary | ICD-10-CM

## 2013-03-19 DIAGNOSIS — W010XXA Fall on same level from slipping, tripping and stumbling without subsequent striking against object, initial encounter: Secondary | ICD-10-CM | POA: Insufficient documentation

## 2013-03-19 DIAGNOSIS — Y9289 Other specified places as the place of occurrence of the external cause: Secondary | ICD-10-CM | POA: Insufficient documentation

## 2013-03-19 DIAGNOSIS — W1809XA Striking against other object with subsequent fall, initial encounter: Secondary | ICD-10-CM | POA: Insufficient documentation

## 2013-03-19 DIAGNOSIS — S82899A Other fracture of unspecified lower leg, initial encounter for closed fracture: Secondary | ICD-10-CM | POA: Insufficient documentation

## 2013-03-19 DIAGNOSIS — Y9301 Activity, walking, marching and hiking: Secondary | ICD-10-CM | POA: Insufficient documentation

## 2013-03-19 DIAGNOSIS — Z79899 Other long term (current) drug therapy: Secondary | ICD-10-CM | POA: Insufficient documentation

## 2013-03-19 DIAGNOSIS — E785 Hyperlipidemia, unspecified: Secondary | ICD-10-CM | POA: Insufficient documentation

## 2013-03-19 DIAGNOSIS — R269 Unspecified abnormalities of gait and mobility: Secondary | ICD-10-CM | POA: Insufficient documentation

## 2013-03-19 MED ORDER — ACETAMINOPHEN-CODEINE #3 300-30 MG PO TABS
1.0000 | ORAL_TABLET | Freq: Once | ORAL | Status: AC
  Administered 2013-03-19: 1 via ORAL
  Filled 2013-03-19: qty 1

## 2013-03-19 MED ORDER — ACETAMINOPHEN-CODEINE #3 300-30 MG PO TABS: 1.0000 | ORAL_TABLET | Freq: Four times a day (QID) | ORAL | Status: DC | PRN

## 2013-03-19 MED ORDER — IBUPROFEN 200 MG PO TABS: 200.0000 mg | ORAL_TABLET | ORAL | Status: DC | PRN

## 2013-03-19 NOTE — ED Notes (Signed)
Nanavati, MD at bedside. 

## 2013-03-19 NOTE — ED Notes (Signed)
Patient states she slipped on the ice down some stairs earlier this morning.  Patient has a large abrasion on her left shin and swelling to her left ankle.  Patient states she also hit her head on the side of the house but denies LOC or pain.

## 2013-03-19 NOTE — ED Provider Notes (Signed)
History     CSN: [id number]  Arrival date & time 03/19/13  1222   First MD Initiated Contact with Patient 03/19/13 1249      Chief Complaint  Patient presents with  . Fall  . Leg Pain    (Consider location/radiation/quality/duration/timing/severity/associated sxs/prior treatment) Patient is a 72 y.o. female presenting with fall and leg pain. The history is provided by the patient.  Fall The accident occurred less than 1 hour ago. The fall occurred while walking. She fell from a height of 1 to 2 ft. She landed on concrete. There was no blood loss. The point of impact was the head. The pain is at a severity of 5/10. The pain is mild. She was ambulatory at the scene. There was no drug use involved in the accident. Pertinent negatives include no numbness, no abdominal pain, no nausea, no vomiting, no headaches, no loss of consciousness and no tingling. The symptoms are aggravated by ambulation. She has tried nothing for the symptoms.  Leg Pain Associated symptoms: no neck pain     Past Medical History  Diagnosis Date  . Hyperlipidemia     Past Surgical History  Procedure Laterality Date  . Mass excision  01/11/2012    Procedure: MINOR EXCISION OF MASS;  Surgeon: Wyn Forster., MD;  Location: [facility];  Service: Orthopedics;  Laterality: Right;  excision mucoid cyst right index and dip joint debridement  . Abdominal hysterectomy      Family History  Problem Relation Age of Onset  . Hypertension Mother   . Other Father     varicose veins  . Hypertension Father     History  Substance Use Topics  . Smoking status: Current Every Day Smoker -- 1.00 packs/day    Types: Cigarettes  . Smokeless tobacco: Never Used  . Alcohol Use: 3.0 oz/week    5 Glasses of wine per week    OB History   Grav Para Term Preterm Abortions TAB SAB Ect Mult Living                  Review of Systems  Constitutional: Positive for activity change.  HENT: Negative for neck  pain.   Respiratory: Negative for shortness of breath.   Cardiovascular: Negative for chest pain.  Gastrointestinal: Negative for nausea, vomiting and abdominal pain.  Genitourinary: Negative for dysuria.  Musculoskeletal: Positive for myalgias, arthralgias and gait problem.  Neurological: Negative for tingling, loss of consciousness, numbness and headaches.    Allergies  Review of patient's allergies indicates no known allergies.  Home Medications   Current Outpatient Rx  Name  Route  Sig  Dispense  Refill  . aspirin 81 MG tablet   Oral   Take 81 mg by mouth every other day.         . pravastatin (PRAVACHOL) 20 MG tablet   Oral   Take 20 mg by mouth daily.           BP 126/73  Pulse 78  Temp(Src) 97.8 F (36.6 C) (Oral)  Resp 20  SpO2 98%  Physical Exam  Nursing note and vitals reviewed. Constitutional: She is oriented to person, place, and time. She appears well-developed and well-nourished.  HENT:  Head: Normocephalic and atraumatic.  Eyes: EOM are normal. Pupils are equal, round, and reactive to light.  Neck: Neck supple.  Cardiovascular: Normal rate, regular rhythm and normal heart sounds.   No murmur heard. Pulmonary/Chest: Effort normal. No respiratory distress.  Abdominal:  Soft. She exhibits no distension. There is no tenderness. There is no rebound and no guarding.  Musculoskeletal:  Left lateral malleoli tenderness- mostly superior to the malleoli, with minimal swelling. No medial malleoli tenderness and no tenderness elsewhere on the foot.. Neurovascularly intact.  Neurological: She is alert and oriented to person, place, and time.  Skin: Skin is warm and dry.    ED Course  Procedures (including critical care time)  Labs Reviewed - No data to display Dg Tibia/fibula Left  03/19/2013  *RADIOLOGY REPORT*  Clinical Data: Fall  LEFT TIBIA AND FIBULA - 2 VIEW  Comparison: None.  Findings: Four views of the left tibia-fibula submitted.  There is oblique  nondisplaced fracture in distal left fibula.  IMPRESSION: Oblique nondisplaced fracture in distal left fibula.   Original Report Authenticated By: Natasha Mead, M.D.    Ct Head Wo Contrast  03/19/2013  *RADIOLOGY REPORT*  Clinical Data: History of fall complaining of leg pain.  CT HEAD WITHOUT CONTRAST  Technique:  Contiguous axial images were obtained from the base of the skull through the vertex without contrast.  Comparison: No priors.  Findings: Mild cerebral and cerebellar atrophy is age appropriate. There are patchy and confluent areas of decreased attenuation throughout the deep and periventricular white matter of the cerebral hemispheres bilaterally, compatible with mild chronic microvascular ischemic disease. No acute intracranial abnormalities.  Specifically, no evidence of acute intracranial hemorrhage, no definite findings of acute/subacute cerebral ischemia, no mass, mass effect, hydrocephalus or abnormal intra or extra-axial fluid collections.  Visualized paranasal sinuses and mastoids are generally well pneumatized. However, there are small soft tissue lesions in the sphenoid sinus bilaterally, compatible with small mucosal retention cysts or polyps.  No acute displaced skull fractures are identified.  IMPRESSION: 1.  No acute intracranial abnormalities. 2.  Mild cerebral and cerebellar atrophy with mild chronic microvascular ischemic changes in the white matter. 3.  Mild paranasal sinus disease, as above.   Original Report Authenticated By: Trudie Reed, M.D.      No diagnosis found.    MDM  Pt comes in post fall.  DDx includes: - Mechanical falls - ICH - Fractures - Contusions - Soft tissue injury   Ct head is neg. Xray shows non displaced femur fx. Dr. Turner Daniels OK with partial weight bearing if there is no medial tenderness and swelling - which is indeed the case. Will d.c.  Derwood Kaplan, MD 03/19/13 1545

## 2013-03-19 NOTE — ED Notes (Signed)
Ortho tech remains at bedside with pt.

## 2013-04-01 ENCOUNTER — Encounter: Payer: Self-pay | Admitting: *Deleted

## 2013-04-02 ENCOUNTER — Ambulatory Visit (INDEPENDENT_AMBULATORY_CARE_PROVIDER_SITE_OTHER): Payer: Medicare Other | Admitting: *Deleted

## 2013-04-02 DIAGNOSIS — I781 Nevus, non-neoplastic: Secondary | ICD-10-CM

## 2013-04-02 DIAGNOSIS — I83893 Varicose veins of bilateral lower extremities with other complications: Secondary | ICD-10-CM

## 2013-04-02 NOTE — Progress Notes (Signed)
X=.3% Sotradecol administered with a 27g butterfly.  Patient received a total of 10cc.  Treated areas that have bled in the past. Tol well. Easy access. Will see if she needs her 2nd tx in 6 weeks.  Photos: yes  Compression stockings applied: yes

## 2013-05-14 ENCOUNTER — Ambulatory Visit: Payer: Medicare Other | Admitting: *Deleted

## 2013-05-20 ENCOUNTER — Encounter: Payer: Self-pay | Admitting: *Deleted

## 2013-05-21 ENCOUNTER — Ambulatory Visit: Payer: Medicare Other | Admitting: *Deleted

## 2013-06-20 ENCOUNTER — Other Ambulatory Visit: Payer: Self-pay | Admitting: Physician Assistant

## 2013-06-20 DIAGNOSIS — C4491 Basal cell carcinoma of skin, unspecified: Secondary | ICD-10-CM

## 2013-06-20 HISTORY — DX: Basal cell carcinoma of skin, unspecified: C44.91

## 2014-05-25 ENCOUNTER — Ambulatory Visit: Payer: Medicare Other | Attending: Family Medicine

## 2014-05-25 DIAGNOSIS — R5381 Other malaise: Secondary | ICD-10-CM | POA: Insufficient documentation

## 2014-05-25 DIAGNOSIS — M545 Low back pain, unspecified: Secondary | ICD-10-CM | POA: Insufficient documentation

## 2014-05-25 DIAGNOSIS — IMO0001 Reserved for inherently not codable concepts without codable children: Secondary | ICD-10-CM | POA: Diagnosis present

## 2014-05-27 ENCOUNTER — Ambulatory Visit: Payer: Medicare Other | Admitting: Physical Therapy

## 2014-05-27 DIAGNOSIS — IMO0001 Reserved for inherently not codable concepts without codable children: Secondary | ICD-10-CM | POA: Diagnosis not present

## 2014-06-01 ENCOUNTER — Ambulatory Visit: Payer: Medicare Other | Admitting: Physical Therapy

## 2014-06-01 DIAGNOSIS — IMO0001 Reserved for inherently not codable concepts without codable children: Secondary | ICD-10-CM | POA: Diagnosis not present

## 2014-06-03 ENCOUNTER — Ambulatory Visit: Payer: Medicare Other

## 2014-06-03 DIAGNOSIS — IMO0001 Reserved for inherently not codable concepts without codable children: Secondary | ICD-10-CM | POA: Diagnosis not present

## 2014-06-15 ENCOUNTER — Ambulatory Visit: Payer: Medicare Other

## 2014-06-15 DIAGNOSIS — IMO0001 Reserved for inherently not codable concepts without codable children: Secondary | ICD-10-CM | POA: Diagnosis not present

## 2014-06-17 ENCOUNTER — Ambulatory Visit: Payer: Medicare Other

## 2014-06-17 DIAGNOSIS — IMO0001 Reserved for inherently not codable concepts without codable children: Secondary | ICD-10-CM | POA: Diagnosis not present

## 2014-11-27 ENCOUNTER — Other Ambulatory Visit: Payer: Self-pay | Admitting: Family Medicine

## 2014-11-27 DIAGNOSIS — R1031 Right lower quadrant pain: Secondary | ICD-10-CM

## 2014-11-27 DIAGNOSIS — Z90710 Acquired absence of both cervix and uterus: Secondary | ICD-10-CM

## 2014-11-27 DIAGNOSIS — Z9049 Acquired absence of other specified parts of digestive tract: Secondary | ICD-10-CM

## 2014-12-03 ENCOUNTER — Ambulatory Visit
Admission: RE | Admit: 2014-12-03 | Discharge: 2014-12-03 | Disposition: A | Discharge status: Home / Self Care | Payer: Medicare Other | Source: Ambulatory Visit | Attending: Family Medicine | Admitting: Family Medicine

## 2014-12-03 DIAGNOSIS — Z90710 Acquired absence of both cervix and uterus: Secondary | ICD-10-CM

## 2014-12-03 DIAGNOSIS — Z9049 Acquired absence of other specified parts of digestive tract: Secondary | ICD-10-CM

## 2014-12-03 DIAGNOSIS — R1031 Right lower quadrant pain: Secondary | ICD-10-CM

## 2015-10-29 ENCOUNTER — Encounter (HOSPITAL_COMMUNITY): Payer: Self-pay | Admitting: Emergency Medicine

## 2015-10-29 ENCOUNTER — Emergency Department (HOSPITAL_COMMUNITY)
Admission: EM | Admit: 2015-10-29 | Discharge: 2015-10-29 | Disposition: A | Discharge status: Home / Self Care | Payer: Medicare Other | Attending: Emergency Medicine | Admitting: Emergency Medicine

## 2015-10-29 DIAGNOSIS — Z79899 Other long term (current) drug therapy: Secondary | ICD-10-CM | POA: Insufficient documentation

## 2015-10-29 DIAGNOSIS — E785 Hyperlipidemia, unspecified: Secondary | ICD-10-CM | POA: Diagnosis not present

## 2015-10-29 DIAGNOSIS — M545 Low back pain: Secondary | ICD-10-CM | POA: Diagnosis present

## 2015-10-29 DIAGNOSIS — M5442 Lumbago with sciatica, left side: Secondary | ICD-10-CM | POA: Insufficient documentation

## 2015-10-29 DIAGNOSIS — Z720 Tobacco use: Secondary | ICD-10-CM | POA: Insufficient documentation

## 2015-10-29 DIAGNOSIS — Z7982 Long term (current) use of aspirin: Secondary | ICD-10-CM | POA: Insufficient documentation

## 2015-10-29 MED ORDER — KETOROLAC TROMETHAMINE 60 MG/2ML IM SOLN
30.0000 mg | Freq: Once | INTRAMUSCULAR | Status: AC
  Administered 2015-10-29: 30 mg via INTRAMUSCULAR
  Filled 2015-10-29: qty 2

## 2015-10-29 NOTE — ED Notes (Signed)
Per pt, states she was getting dressed and a few minutes later started having back pain radiating down left leg-history of lower back pain

## 2015-10-29 NOTE — ED Provider Notes (Signed)
CSN: [id number]     Arrival date & time 10/29/15  1219 History   First MD Initiated Contact with Patient 10/29/15 1503     Chief Complaint  Patient presents with  . Back Pain     (Consider location/radiation/quality/duration/timing/severity/associated sxs/prior Treatment) Patient is a 74 y.o. female presenting with back pain. The history is provided by the patient.  Back Pain Location:  Lumbar spine Quality:  Aching Radiates to:  L posterior upper leg and L foot Pain severity:  Moderate Onset quality:  Sudden Duration:  5 hours Timing:  Intermittent Progression:  Partially resolved Chronicity:  New Context: twisting (earlier this morning)   Relieved by:  Being still Worsened by:  Nothing tried Associated symptoms: no abdominal pain, no bladder incontinence, no bowel incontinence, no fever, no perianal numbness, no tingling, no weakness and no weight loss     Past Medical History  Diagnosis Date  . Hyperlipidemia    Past Surgical History  Procedure Laterality Date  . Mass excision  01/11/2012    Procedure: MINOR EXCISION OF MASS;  Surgeon: Cammie Sickle., MD;  Location: [facility];  Service: Orthopedics;  Laterality: Right;  excision mucoid cyst right index and dip joint debridement  . Abdominal hysterectomy     Family History  Problem Relation Age of Onset  . Hypertension Mother   . Other Father     varicose veins  . Hypertension Father    Social History  Substance Use Topics  . Smoking status: Current Every Day Smoker -- 1.00 packs/day    Types: Cigarettes  . Smokeless tobacco: Never Used  . Alcohol Use: 3.0 oz/week    5 Glasses of wine per week   OB History    No data available     Review of Systems  Constitutional: Negative for fever and weight loss.  Gastrointestinal: Negative for abdominal pain and bowel incontinence.  Genitourinary: Negative for bladder incontinence.  Musculoskeletal: Positive for back pain.  Neurological: Negative  for tingling and weakness.  All other systems reviewed and are negative.     Allergies  Review of patient's allergies indicates no known allergies.  Home Medications   Prior to Admission medications   Medication Sig Start Date End Date Taking? Authorizing Provider  albuterol (PROVENTIL HFA;VENTOLIN HFA) 108 (90 BASE) MCG/ACT inhaler Inhale into the lungs every 6 (six) hours as needed for wheezing or shortness of breath.   Yes Historical Provider, MD  aspirin 81 MG tablet Take 81 mg by mouth every other day.   Yes Historical Provider, MD  Cholecalciferol (VITAMIN D) 2000 UNITS tablet Take 2,000 Units by mouth daily.   Yes Historical Provider, MD  naproxen sodium (ANAPROX) 220 MG tablet Take 220 mg by mouth 2 (two) times daily as needed (pain).   Yes Historical Provider, MD  OVER THE COUNTER MEDICATION Take 2 tablets by mouth 2 (two) times daily as needed (cold symptoms). Cold medication.   Yes Historical Provider, MD  pravastatin (PRAVACHOL) 20 MG tablet Take 20 mg by mouth daily.   Yes Historical Provider, MD  acetaminophen-codeine (TYLENOL #3) 300-30 MG per tablet Take 1-2 tablets by mouth every 6 (six) hours as needed for pain. Patient not taking: Reported on 10/29/2015 03/19/13   Varney Biles, MD  ibuprofen (ADVIL,MOTRIN) 200 MG tablet Take 1 tablet (200 mg total) by mouth every 4 (four) hours as needed for pain. Patient not taking: Reported on 10/29/2015 03/19/13   Varney Biles, MD   BP 131/73 mmHg  Pulse 65  Temp(Src) 97.7 F (36.5 C) (Oral)  Resp 18  SpO2 100% Physical Exam  Constitutional: She is oriented to person, place, and time. She appears well-developed and well-nourished. No distress.  HENT:  Head: Normocephalic.  Eyes: Conjunctivae are normal.  Neck: Neck supple. No tracheal deviation present.  Cardiovascular: Normal rate and regular rhythm.   Pulmonary/Chest: Effort normal. No respiratory distress.  Abdominal: Soft. She exhibits no distension. There is no  tenderness. There is no rebound and no guarding.  Musculoskeletal:  Negative straight leg raise b/l  Neurological: She is alert and oriented to person, place, and time. She has normal strength. No sensory deficit. Gait normal.  Skin: Skin is warm and dry.  Psychiatric: She has a normal mood and affect.    ED Course  Procedures (including critical care time) Labs Review Labs Reviewed - No data to display  Imaging Review No results found. I have personally reviewed and evaluated these images and lab results as part of my medical decision-making.   EKG Interpretation None      MDM   Final diagnoses:  Left-sided low back pain with left-sided sciatica    74 year old female presents with sudden onset atraumatic left-sided back pain with sciatica. No red flag symptoms of fever, weight loss, saddle anesthesia, weakness fecal/urinary incontinence or urinary retention. Walking without difficulty in the emergency department. Recommended NSAIDs and early mobility. Plan to follow up with PCP as needed and return precautions discussed for worsening or new concerning symptoms.     Leo Grosser, MD 10/29/15 [phone]

## 2015-10-29 NOTE — Discharge Instructions (Signed)

## 2016-10-03 ENCOUNTER — Other Ambulatory Visit: Payer: Self-pay | Admitting: Physician Assistant

## 2016-10-19 ENCOUNTER — Other Ambulatory Visit: Payer: Self-pay | Admitting: Physician Assistant

## 2017-02-06 ENCOUNTER — Ambulatory Visit: Payer: Medicare Other | Attending: Family Medicine | Admitting: Physical Therapy

## 2017-02-06 DIAGNOSIS — R2689 Other abnormalities of gait and mobility: Secondary | ICD-10-CM

## 2017-02-06 DIAGNOSIS — G8929 Other chronic pain: Secondary | ICD-10-CM | POA: Diagnosis present

## 2017-02-06 DIAGNOSIS — M5442 Lumbago with sciatica, left side: Secondary | ICD-10-CM | POA: Diagnosis present

## 2017-02-06 DIAGNOSIS — M25552 Pain in left hip: Secondary | ICD-10-CM

## 2017-02-06 DIAGNOSIS — M6281 Muscle weakness (generalized): Secondary | ICD-10-CM

## 2017-02-06 NOTE — Patient Instructions (Addendum)
Sleeping on Side    Place pillow between knees. Use cervical support under neck and a roll around waist as needed.   Copyright  VHI. All rights reserved.  Sleeping on Back    Place pillow under knees. A pillow with cervical support and a roll around waist are also helpful.   Copyright  VHI. All rights reserved.  Posture - Sitting    Sit upright, head facing forward. Try using a roll to support lower back. Keep shoulders relaxed, and avoid rounded back. Keep hips level with knees. Avoid crossing legs for long periods.   Copyright  VHI. All rights reserved.  [facility] [address], [place], [place] [address] Phone # [phone] Fax [phone]

## 2017-02-06 NOTE — Therapy (Signed)
[facility] [address] [place], [place], [address] Phone: [phone]   Fax:  [phone]  Physical Therapy Evaluation  Patient Details  Name: Karen Bonilla MRN: [id number] Date of Birth: 1941/07/18 Referring Provider: Dr. Leighton Ruff  Encounter Date: 02/06/2017      PT End of Session - 02/06/17 1441    Visit Number 1   Number of Visits 10   Date for PT Re-Evaluation 04/03/17   Authorization Type Medicare G codes;  KX   PT Start Time [id number]   PT Stop Time 1725   PT Time Calculation (min) 42 min   Activity Tolerance Patient tolerated treatment well      Past Medical History:  Diagnosis Date  . Hyperlipidemia     Past Surgical History:  Procedure Laterality Date  . ABDOMINAL HYSTERECTOMY    . MASS EXCISION  01/11/2012   Procedure: MINOR EXCISION OF MASS;  Surgeon: Cammie Sickle., MD;  Location: [place];  Service: Orthopedics;  Laterality: Right;  excision mucoid cyst right index and dip joint debridement    There were no vitals filed for this visit.       Subjective Assessment - 02/06/17 1450    Subjective When to ortho regarding back:   DDD and spinal stenosis.  Had cortisone injection 1 month in back on Wed.   By Friday front thighs ached and then entire thighs.  Left > right.    Some improvement with muscle relaxer and Tylenol.    My buttocks ache sometimes too.  Achiness in grocery store and sharp in buttocks.    Disturbing sleep.     Pertinent History on muscle relaxer ;  water aerobics 4x/week;  hx of right sciatica   Limitations House hold activities;Standing;Walking   How long can you sit comfortably? I can sit OK. Need softer chair.     How long can you walk comfortably? 2 minutes less fluid than I normally walk    Diagnostic tests MRI  DDD stenosis   Patient Stated Goals pain to go away, avoid surgery if possible   Currently in Pain? No/denies   Pain Score 0-No pain   Pain Location Back   Pain Orientation Right;Left;Anterior;Posterior   Pain Descriptors / Indicators Aching   Pain Type Chronic pain   Pain Radiating Towards left > right both legs, anterior thighs, legs;  no numbness    Pain Onset More than a month ago   Pain Frequency Constant   Aggravating Factors  crossing legs to put on socks shoes;  sometimes walking   Pain Relieving Factors medication;  sitting down            [facility] PT Assessment - 02/06/17 0001      Assessment   Medical Diagnosis spinal stenosis and bilateral leg pain   Referring Provider Dr. Leighton Ruff   Onset Date/Surgical Date --  1 month   Hand Dominance Right   Next MD Visit nothing scheduled   Prior Therapy PT for sciatica few years ago      Precautions   Precautions None     Restrictions   Weight Bearing Restrictions No     Balance Screen   Has the patient fallen in the past 6 months No   Has the patient had a decrease in activity level because of a fear of falling?  Yes  be careful on step   Is the patient reluctant to leave their home because of  a fear of falling?  No     Home Environment   Living Environment Private residence   Living Arrangements Alone   Type of [place] to enter   Entrance Stairs-Number of Steps 6   Entrance Stairs-Rails None   Home Layout One level   [place] - single point     Prior Function   Level of Independence Independent with basic ADLs   Vocation Retired   Leisure work in the yard, go to [employer], do [employer], reading     Observation/Other Assessments   Focus on Therapeutic Outcomes (FOTO)  52% limitation     Posture/Postural Control   Posture/Postural Control Postural limitations   Postural Limitations Decreased lumbar lordosis   Posture Comments right iliac crest higher than left; shoulders shifted to right     ROM / Strength   AROM / PROM / Strength AROM;Strength     AROM   Right Hip Flexion 135   Left Hip  Flexion 110   Lumbar Flexion 75 degrees   Lumbar Extension 7 degrees   Lumbar - Right Side Bend 20 degrees  pain at endrange   Lumbar - Left Side Bend 24 degrees  pull on left leg     Strength   Right Hip Flexion 5/5   Right Hip ABduction 4-/5   Left Hip Flexion 5/5   Left Hip ABduction 4-/5   Right Knee Flexion 5/5   Right Knee Extension 5/5   Left Knee Flexion 5/5   Left Knee Extension 5/5     Flexibility   Hamstrings tight bi.    Quadriceps tight bil.      Palpation   Palpation comment tenderness posterior left hip and on right;      Special Tests    Special Tests Lumbar;Hip Special Tests   Lumbar Tests Slump Test   Hip Special Tests  Hip Scouring     Slump test   Findings Positive  mild   Side Left     Straight Leg Raise   Findings Negative   Side  Right   Comment 80 degrees  left AROM is painful in left groin     Hip Scouring   Findings Positive   Side Right   Comments 2 O'clock pain on right; left IR 5 degrees and painful     other   Findings Positive   Side Right;Left   Comments long leg distraction decreased pain     Transfers   Transfers Sit to Stand   Sit to Stand 6: Modified independent (Device/Increase time);Without upper extremity assist   Five time sit to stand comments  18 seconds     Ambulation/Gait   Ambulation/Gait Yes   Assistive device None   Gait Pattern Decreased step length - right;Decreased step length - left;Decreased weight shift to right;Decreased weight shift to left  decreased bil. hip extension     Balance   Balance Assessed --  increased trunk sway with left single leg stance                   [facility] Adult PT Treatment/Exercise - 02/06/17 0001      Therapeutic Activites    Therapeutic Activities Other Therapeutic Activities   Other Therapeutic Activities pillows between knees with laying down                PT Education - 02/06/17 1519    Education provided Yes   Education  Details lay on side  with pillow, lay on back with pillow, sitting   Person(s) Educated Patient   Methods Explanation;Demonstration;Handout   Comprehension Returned demonstration;Verbalized understanding          PT Short Term Goals - 02/06/17 1716      PT SHORT TERM GOAL #1   Title independent with initial HEP   Time 4   Period Weeks   Status New     PT SHORT TERM GOAL #2   Title walk for 8 minutes with pain decreased >/= 25%   Time 4   Period Weeks   Status New     PT SHORT TERM GOAL #3   Title Patient will report a 25% improvement in sleep   Time 4   Period Weeks   Status New           PT Long Term Goals - 02/06/17 1524      PT LONG TERM GOAL #1   Title Independent with HEP   Time 8   Status New     PT LONG TERM GOAL #2   Title walk for longer for 10-15 minutes with left hip pain decreased >/= 50%   Time 8   Period Weeks   Status New     PT LONG TERM GOAL #3   Title The patient will have improved hip abduction and extension strength to 4/5 needed to rise from a chair with greater ease   Baseline 5x sit to stand test   Time 8   Period Weeks   Status New     PT LONG TERM GOAL #4   Title The patient will report a 60% improvement in sleep   Time 8   Period Weeks   Status New     PT LONG TERM GOAL #5   Title FOTO functional outcome score improved from 52% to 37% indicating improved function with less pain   Time 8   Period Weeks   Status New               Plan - 02/06/17 1528    Clinical Impression Statement The patient presents with a history of lumbar stenosis and lumbar DDD.  About a month ago, she had a cortisone injection and about 2 days later she had the new onset of left > right anterior thigh pain which developed in whole leg achiness.  The patient has postural changes with a lateral shift, pelvic obliquity.  Decreased lumbar extension ROM.  LE strength WNLs except hip abduction strength 4-/5.  Decreased core strength.  Minor tender points in bilateral  gluteals and piriformis and left hip flexors.  Difficulty single leg standing bilaterally.  Decreased walking tolerance to 2 minutes at the grocery store comfortably.  Difficulty rising from a chair without UE use.  Difficulty sleeping secondary to pain.  The patient is of low complexity evaluating secondary to stable status and minimal co-morbidities.     Rehab Potential Good   Clinical Impairments Affecting Rehab Potential None   PT Frequency 2x / week   PT Duration 8 weeks   PT Treatment/Interventions Electrical Stimulation;Iontophoresis 4mg /ml Dexamethasone;Gait training;Ultrasound;Therapeutic activities;Moist Heat;Therapeutic exercise;Balance training;Neuromuscular re-education;Patient/family education;Passive range of motion;Manual techniques;Dry needling;Energy conservation  home tens unit   PT Next Visit Plan initiate abdominal brace;  clams;  hip flexor stretch,  Hip long axis distraction   Recommended Other Services Home TENs unit   Consulted and Agree with Plan of Care Patient      Patient will  benefit from skilled therapeutic intervention in order to improve the following deficits and impairments:  Abnormal gait, Decreased range of motion, Difficulty walking, Increased fascial restricitons, Pain, Decreased mobility, Decreased strength, Impaired flexibility, Decreased activity tolerance  Visit Diagnosis: Muscle weakness (generalized) - Plan: PT plan of care cert/re-cert  Pain in left hip - Plan: PT plan of care cert/re-cert  Other abnormalities of gait and mobility - Plan: PT plan of care cert/re-cert  Chronic left-sided low back pain with left-sided sciatica - Plan: PT plan of care cert/re-cert      G-Codes - [id number]    Functional Assessment Tool Used FOTO score 52% limitation  goal is 37% limitation   Functional Limitation Mobility: Walking and moving around   Mobility: Walking and Moving Around Current Status [id number]) At least 40 percent but less than 60 percent  impaired, limited or restricted   Mobility: Walking and Moving Around Goal Status [phone]) At least 60 percent but less than 80 percent impaired, limited or restricted       Problem List Patient Active Problem List   Diagnosis Date Noted  . Varicose veins of lower extremities with other complications [id number]   Ruben Im, PT 02/06/17 5:22 PM Phone: [phone] Fax: [phone]  [name] 02/06/2017, 5:22 PM  [facility] [address], [place], [place], [address] Phone: [phone]   Fax:  [phone]  Name: Karen Bonilla MRN: [id number] Date of Birth: Nov 25, 1941

## 2017-02-13 ENCOUNTER — Ambulatory Visit: Payer: Medicare Other | Admitting: Physical Therapy

## 2017-02-13 DIAGNOSIS — G8929 Other chronic pain: Secondary | ICD-10-CM

## 2017-02-13 DIAGNOSIS — R2689 Other abnormalities of gait and mobility: Secondary | ICD-10-CM

## 2017-02-13 DIAGNOSIS — M5442 Lumbago with sciatica, left side: Secondary | ICD-10-CM

## 2017-02-13 DIAGNOSIS — M6281 Muscle weakness (generalized): Secondary | ICD-10-CM | POA: Diagnosis not present

## 2017-02-13 DIAGNOSIS — M25552 Pain in left hip: Secondary | ICD-10-CM

## 2017-02-13 NOTE — Patient Instructions (Signed)
      Abduction: Clam (Eccentric) - Side-Lying   Lie on side with knees bent.  Tip forward a little bit.  Lift top knee, keeping feet together. Keep trunk steady. Slowly lower for 3-5 seconds. __10_ reps per set, __1_ sets per day, _7__ days per week.   Copyright  VHI. All rights reserved.    Karen Bonilla PT [facility] [address] [place], [place] [address] Phone # [phone] Fax [phone]

## 2017-02-13 NOTE — Therapy (Signed)
[facility] [address], [place], [place], [address] Phone: [phone]   Fax:  [phone]  Physical Therapy Treatment  Patient Details  Name: Karen Bonilla MRN: [id number] Date of Birth: 11-29-1941 Referring Provider: Dr. Leighton Ruff  Encounter Date: 02/13/2017      PT End of Session - 02/13/17 1434    Visit Number 2   Number of Visits 10   Date for PT Re-Evaluation 04/03/17   Authorization Type Medicare G codes;  KX   PT Start Time 1400   PT Stop Time 1445   PT Time Calculation (min) 45 min   Activity Tolerance Patient tolerated treatment well      Past Medical History:  Diagnosis Date  . Hyperlipidemia     Past Surgical History:  Procedure Laterality Date  . ABDOMINAL HYSTERECTOMY    . MASS EXCISION  01/11/2012   Procedure: MINOR EXCISION OF MASS;  Surgeon: Karen Bonilla., MD;  Location: [place];  Service: Orthopedics;  Laterality: Right;  excision mucoid cyst right index and dip joint debridement    There were no vitals filed for this visit.      Subjective Assessment - 02/13/17 1404    Subjective The pain comes and goes.  My legs just feel tired.  Some achiness in hips left > right.  No increased in discomfort after initial eval.  Sleeping better.  A little better with lifting leg to put on socks.  No pain in sitting, produced with walking.  Went to water aerobics this morning.       Currently in Pain? No/denies   Pain Score 0-No pain                         [facility] Adult PT Treatment/Exercise - 02/13/17 0001      Therapeutic Activites    Other Therapeutic Activities standing at the sink with foot prop and with abdominal brace;  ab brace while sitting;  ab brace while walking     Neuro Re-ed    Neuro Re-ed Details  transverse abdominal activation in supine, sitting and standing;  gluteus medius activation     Lumbar Exercises: Stretches   Double Knee  to Chest Stretch 5 reps   Double Knee to Chest Stretch Limitations with heels on red ball   Quad Stretch 5 reps   Quad Stretch Limitations doorway psoas stretch right/left   Piriformis Stretch 5 reps   Piriformis Stretch Limitations with red ball     Lumbar Exercises: Supine   Ab Set 10 reps   Isometric Hip Flexion 10 reps     Knee/Hip Exercises: Sidelying   Hip ABduction AROM;Right;Left;1 set;10 reps     Manual Therapy   Manual Therapy Joint mobilization;Muscle Energy Technique   Joint Mobilization grade 2/3 hip long axis distraction, inferior, hip flexion 90 degrees distraction 3x 20 sec   Muscle Energy Technique piriformis contract/relax 3x 5 sec hold right/left                PT Education - 02/13/17 1431    Education provided Yes   Education Details ab brace; clams   Person(s) Educated Patient   Methods Explanation;Demonstration;Handout   Comprehension Verbalized understanding;Returned demonstration          PT Short Term Goals - 02/13/17 1457      PT SHORT TERM GOAL #1   Title independent with initial HEP   Time 4  Period Weeks   Status On-going     PT SHORT TERM GOAL #2   Title walk for 8 minutes with pain decreased >/= 25%   Period Weeks   Status On-going     PT SHORT TERM GOAL #3   Title Patient will report a 25% improvement in sleep   Time 4   Period Weeks   Status On-going           PT Long Term Goals - 02/13/17 1457      PT LONG TERM GOAL #1   Title Independent with HEP   Time 8   Period Weeks   Status On-going     PT LONG TERM GOAL #2   Title walk for longer for 10-15 minutes with left hip pain decreased >/= 50%   Time 8   Period Weeks   Status On-going     PT LONG TERM GOAL #3   Title The patient will have improved hip abduction and extension strength to 4/5 needed to rise from a chair with greater ease   Time 8   Period Weeks     PT LONG TERM GOAL #4   Title The patient will report a 60% improvement in sleep   Time 8    Period Weeks   Status On-going     PT LONG TERM GOAL #5   Title FOTO functional outcome score improved from 52% to 37% indicating improved function with less pain   Time 8   Period Weeks   Status On-going               Plan - 02/13/17 1452    Clinical Impression Statement The patient states she is sleeping and moving in bed with greater ease.  Able to activate transverse abdominals but needs verbal cues to avoid holding breath.  Good pain relief with hip long axis distraction. Weakness in gluteals makes it difficult to ascend steps especially with carrying object.     PT Next Visit Plan try Nu-Step;  review clams;  start sit to stand no UEs;  start standing hip ex's;  gentle hip mobs if needed;  modalities if needed;  may need to alternate sitting and standing exercises secondary to lumbar stenosis       Patient will benefit from skilled therapeutic intervention in order to improve the following deficits and impairments:     Visit Diagnosis: Muscle weakness (generalized)  Pain in left hip  Other abnormalities of gait and mobility  Chronic left-sided low back pain with left-sided sciatica     Problem List Patient Active Problem List   Diagnosis Date Noted  . Varicose veins of lower extremities with other complications [id number]    Karen Bonilla, PT 02/13/17 3:00 PM Phone: [phone] Fax: [phone]  Karen Bonilla 02/13/2017, 3:00 PM  [facility] [address] [place], [place], [address] Phone: [phone]   Fax:  [phone]  Name: Karen Bonilla MRN: [id number] Date of Birth: 29-Oct-1941

## 2017-02-15 ENCOUNTER — Ambulatory Visit: Payer: Medicare Other | Admitting: Physical Therapy

## 2017-02-15 ENCOUNTER — Encounter: Payer: Self-pay | Admitting: Physical Therapy

## 2017-02-15 DIAGNOSIS — M6281 Muscle weakness (generalized): Secondary | ICD-10-CM | POA: Diagnosis not present

## 2017-02-15 DIAGNOSIS — M25552 Pain in left hip: Secondary | ICD-10-CM

## 2017-02-15 DIAGNOSIS — G8929 Other chronic pain: Secondary | ICD-10-CM

## 2017-02-15 DIAGNOSIS — R2689 Other abnormalities of gait and mobility: Secondary | ICD-10-CM

## 2017-02-15 DIAGNOSIS — M5442 Lumbago with sciatica, left side: Secondary | ICD-10-CM

## 2017-02-15 NOTE — Therapy (Signed)
[facility] [address] [place], [place], [address] Phone: [phone]   Fax:  [phone]  Physical Therapy Treatment  Patient Details  Name: Karen Bonilla MRN: [id number] Date of Birth: [birth year] Referring Provider: Dr. Leighton Ruff  Encounter Date: 02/15/2017      PT End of Session - 02/15/17 1521    Visit Number 3   Number of Visits 10   Date for PT Re-Evaluation 04/03/17   Authorization Type Medicare G codes;  KX   PT Start Time 04/07/1445   PT Stop Time 04-08-27   PT Time Calculation (min) 42 min   Activity Tolerance Patient tolerated treatment well   Behavior During Therapy [facility] for tasks assessed/performed      Past Medical History:  Diagnosis Date  . Hyperlipidemia     Past Surgical History:  Procedure Laterality Date  . ABDOMINAL HYSTERECTOMY    . MASS EXCISION  01/11/2012   Procedure: MINOR EXCISION OF MASS;  Surgeon: Cammie Sickle., MD;  Location: [place];  Service: Orthopedics;  Laterality: Right;  excision mucoid cyst right index and dip joint debridement    There were no vitals filed for this visit.      Subjective Assessment - 02/15/17 1450    Subjective Feeling worse today than on Tuesday.  Was doing yard work for several hours on hands knees yesterday which may have irritated things   Currently in Pain? Yes   Pain Score 5   "moderate"   Pain Location Back   Pain Orientation Right;Left;Posterior   Pain Descriptors / Indicators Aching   Pain Radiating Towards into upper thigh and groin more on the left   Pain Onset More than a month ago   Pain Frequency Intermittent   Aggravating Factors  going sit stand, walking   Multiple Pain Sites No                         [facility] Adult PT Treatment/Exercise - 02/15/17 0001      Therapeutic Activites    Therapeutic Activities Other Therapeutic Activities   Other Therapeutic Activities quadruped and abdominal  contraction with sit to stand simulating functional acitvities     Lumbar Exercises: Stretches   Piriformis Stretch 3 reps;20 seconds     Lumbar Exercises: Quadruped   [place] 10 reps   Single Arm Raise Right;Left;10 reps  simulating reach with core stability   Other Quadruped Lumbar Exercises abdominal isometric contraction in quadrupaed  core stability during functional gardening     Knee/Hip Exercises: Aerobic   Nustep L1 x 4 min     Knee/Hip Exercises: Seated   Sit to Sand 10 reps;without UE support  abdominal bracing     Knee/Hip Exercises: Supine   Bridges Strengthening;Both;10 reps  5 sec hold     Knee/Hip Exercises: Sidelying   Clams 15x both sides     Manual Therapy   Manual Therapy Soft tissue mobilization   Soft tissue mobilization adductors and glutes Left side - sidelying and supine                  PT Short Term Goals - 02/13/17 1457      PT SHORT TERM GOAL #1   Title independent with initial HEP   Time 4   Period Weeks   Status On-going     PT SHORT TERM GOAL #2   Title walk for 8 minutes with  pain decreased >/= 25%   Period Weeks   Status On-going     PT SHORT TERM GOAL #3   Title Patient will report a 25% improvement in sleep   Time 4   Period Weeks   Status On-going           PT Long Term Goals - 02/13/17 1457      PT LONG TERM GOAL #1   Title Independent with HEP   Time 8   Period Weeks   Status On-going     PT LONG TERM GOAL #2   Title walk for longer for 10-15 minutes with left hip pain decreased >/= 50%   Time 8   Period Weeks   Status On-going     PT LONG TERM GOAL #3   Title The patient will have improved hip abduction and extension strength to 4/5 needed to rise from a chair with greater ease   Time 8   Period Weeks     PT LONG TERM GOAL #4   Title The patient will report a 60% improvement in sleep   Time 8   Period Weeks   Status On-going     PT LONG TERM GOAL #5   Title FOTO functional  outcome score improved from 52% to 37% indicating improved function with less pain   Time 8   Period Weeks   Status On-going             Patient will benefit from skilled therapeutic intervention in order to improve the following deficits and impairments:     Visit Diagnosis: Muscle weakness (generalized)  Pain in left hip  Other abnormalities of gait and mobility  Chronic left-sided low back pain with left-sided sciatica     Problem List Patient Active Problem List   Diagnosis Date Noted  . Varicose veins of lower extremities with other complications 123XX123    Zannie Cove, PT 02/15/2017, 5:39 PM  [facility] [address] [place], [place], [address] Phone: [phone]   Fax:  [phone]  Name: Karen Bonilla MRN: [id number] Date of Birth: [birth year]

## 2017-02-22 ENCOUNTER — Ambulatory Visit: Payer: Medicare Other | Attending: Family Medicine

## 2017-02-22 DIAGNOSIS — G8929 Other chronic pain: Secondary | ICD-10-CM | POA: Diagnosis present

## 2017-02-22 DIAGNOSIS — M5442 Lumbago with sciatica, left side: Secondary | ICD-10-CM | POA: Insufficient documentation

## 2017-02-22 DIAGNOSIS — M25552 Pain in left hip: Secondary | ICD-10-CM | POA: Diagnosis present

## 2017-02-22 DIAGNOSIS — R2689 Other abnormalities of gait and mobility: Secondary | ICD-10-CM

## 2017-02-22 DIAGNOSIS — M6281 Muscle weakness (generalized): Secondary | ICD-10-CM | POA: Diagnosis present

## 2017-02-22 NOTE — Therapy (Signed)
[facility] [address] [place], [place], [address] Phone: [phone]   Fax:  [phone]  Physical Therapy Treatment  Patient Details  Name: Karen Bonilla MRN: [id number] Date of Birth: 1941-04-15 Referring Provider: Dr. Leighton Ruff  Encounter Date: 02/22/2017      PT End of Session - 02/22/17 1142    Visit Number 4   Number of Visits 10   Date for PT Re-Evaluation 04/03/17   Authorization Type Medicare G codes;  KX   PT Start Time 1100   PT Stop Time 1140   PT Time Calculation (min) 40 min   Activity Tolerance Patient tolerated treatment well   Behavior During Therapy [facility] for tasks assessed/performed      Past Medical History:  Diagnosis Date  . Hyperlipidemia     Past Surgical History:  Procedure Laterality Date  . ABDOMINAL HYSTERECTOMY    . MASS EXCISION  01/11/2012   Procedure: MINOR EXCISION OF MASS;  Surgeon: Cammie Sickle., MD;  Location: [place];  Service: Orthopedics;  Laterality: Right;  excision mucoid cyst right index and dip joint debridement    There were no vitals filed for this visit.      Subjective Assessment - 02/22/17 1106    Subjective The manual therapy really helped last session.     Currently in Pain? Yes   Pain Score 2    Pain Location Back   Pain Orientation Left;Posterior   Pain Descriptors / Indicators Aching;Tightness   Pain Type Chronic pain   Pain Onset More than a month ago   Pain Frequency Intermittent   Aggravating Factors  morning hours, activity   Pain Relieving Factors medication, sitting down                         [facility] Adult PT Treatment/Exercise - 02/22/17 0001      Lumbar Exercises: Stretches   Active Hamstring Stretch 3 reps;20 seconds   Piriformis Stretch 3 reps;20 seconds   Piriformis Stretch Limitations butterfly stretch 3x20"     Knee/Hip Exercises: Aerobic   Nustep --     Knee/Hip Exercises:  Seated   Sit to Sand 10 reps;without UE support;2 sets  abdominal bracing     Knee/Hip Exercises: Supine   Bridges Strengthening;Both;10 reps  5 sec hold     Knee/Hip Exercises: Sidelying   Clams 15x both sides     Manual Therapy   Manual Therapy Soft tissue mobilization   Soft tissue mobilization adductors and glutes Left side - sidelying and supine                PT Education - 02/22/17 1120    Education provided Yes   Education Details butterfly stretch   Person(s) Educated Patient   Methods Explanation;Demonstration;Handout   Comprehension Verbalized understanding;Returned demonstration          PT Short Term Goals - 02/22/17 1108      PT SHORT TERM GOAL #1   Title independent with initial HEP   Status Achieved     PT SHORT TERM GOAL #2   Title walk for 8 minutes with pain decreased >/= 25%   Time 4   Period Weeks   Status On-going     PT SHORT TERM GOAL #3   Title Patient will report a 25% improvement in sleep   Status Achieved           PT  Long Term Goals - 02/13/17 1457      PT LONG TERM GOAL #1   Title Independent with HEP   Time 8   Period Weeks   Status On-going     PT LONG TERM GOAL #2   Title walk for longer for 10-15 minutes with left hip pain decreased >/= 50%   Time 8   Period Weeks   Status On-going     PT LONG TERM GOAL #3   Title The patient will have improved hip abduction and extension strength to 4/5 needed to rise from a chair with greater ease   Time 8   Period Weeks     PT LONG TERM GOAL #4   Title The patient will report a 60% improvement in sleep   Time 8   Period Weeks   Status On-going     PT LONG TERM GOAL #5   Title FOTO functional outcome score improved from 52% to 37% indicating improved function with less pain   Time 8   Period Weeks   Status On-going               Plan - 02/22/17 1114    Clinical Impression Statement Pt reports 25% improvement in sleep since the start of care.  Pt  reports increased ease with getting Lt leg into bed.  Pt with LE stiffness and core/hip weakness and is working on exercise at home to address this.  Pt with tension and trigger points in Lt hip adductors and gluteals and demonstrated improved mobility with manual therapy today.  Pt will continue to benefit from skilled PT for hip and core strength, flexibility and manual therapy.     Rehab Potential Good   PT Frequency 2x / week   PT Duration 8 weeks   PT Treatment/Interventions Electrical Stimulation;Iontophoresis 4mg /ml Dexamethasone;Gait training;Ultrasound;Therapeutic activities;Moist Heat;Therapeutic exercise;Balance training;Neuromuscular re-education;Patient/family education;Passive range of motion;Manual techniques;Dry needling;Energy conservation   PT Next Visit Plan Hip and core strength, manual if helpful.  Dry needling to Lt gluteals?     Consulted and Agree with Plan of Care Patient      Patient will benefit from skilled therapeutic intervention in order to improve the following deficits and impairments:  Abnormal gait, Decreased range of motion, Difficulty walking, Increased fascial restricitons, Pain, Decreased mobility, Decreased strength, Impaired flexibility, Decreased activity tolerance  Visit Diagnosis: Muscle weakness (generalized)  Pain in left hip  Other abnormalities of gait and mobility  Chronic left-sided low back pain with left-sided sciatica     Problem List Patient Active Problem List   Diagnosis Date Noted  . Varicose veins of lower extremities with other complications 123XX123     Karen Bonilla, PT 02/22/17 11:43 AM  [facility] [address] [place], [place], [address] Phone: [phone]   Fax:  [phone]  Name: Karen Bonilla MRN: [id number] Date of Birth: Feb 14, 1941

## 2017-02-22 NOTE — Patient Instructions (Addendum)
Butterfly, Supine    Lie on back, feet together. Lower knees toward floor. Hold _20__ seconds. Repeat _3__ times per session. Do _3__ sessions per day.  Copyright  VHI. All rights reserved.  [facility] [address], [place], [place] [address] Phone # [phone] Fax [phone]

## 2017-02-27 ENCOUNTER — Ambulatory Visit: Payer: Medicare Other

## 2017-02-27 DIAGNOSIS — M6281 Muscle weakness (generalized): Secondary | ICD-10-CM | POA: Diagnosis not present

## 2017-02-27 DIAGNOSIS — M5442 Lumbago with sciatica, left side: Secondary | ICD-10-CM

## 2017-02-27 DIAGNOSIS — M25552 Pain in left hip: Secondary | ICD-10-CM

## 2017-02-27 DIAGNOSIS — R2689 Other abnormalities of gait and mobility: Secondary | ICD-10-CM

## 2017-02-27 DIAGNOSIS — G8929 Other chronic pain: Secondary | ICD-10-CM

## 2017-02-27 NOTE — Therapy (Signed)
[facility] [address] [place], [place], [address] Phone: [phone]   Fax:  [phone]  Physical Therapy Treatment  Patient Details  Name: Karen Bonilla MRN: [id number] Date of Birth: January 25, 1941 Referring Provider: Dr. Leighton Ruff  Encounter Date: 02/27/2017      PT End of Session - 02/27/17 1210    Visit Number 5   Number of Visits 10   Date for PT Re-Evaluation 04/03/17   Authorization Type Medicare G codes;  KX   PT Start Time [id number]   PT Stop Time 1225   PT Time Calculation (min) 40 min   Activity Tolerance Patient tolerated treatment well   Behavior During Therapy [facility] for tasks assessed/performed      Past Medical History:  Diagnosis Date  . Hyperlipidemia     Past Surgical History:  Procedure Laterality Date  . ABDOMINAL HYSTERECTOMY    . MASS EXCISION  01/11/2012   Procedure: MINOR EXCISION OF MASS;  Surgeon: Cammie Sickle., MD;  Location: [place];  Service: Orthopedics;  Laterality: Right;  excision mucoid cyst right index and dip joint debridement    There were no vitals filed for this visit.      Subjective Assessment - 02/27/17 1150    Subjective I slipped on a wet surface this morning and almost fell.  It think that my walking is improved with therapy.     Pertinent History on muscle relaxer ;  water aerobics 4x/week;  hx of right sciatica   Currently in Pain? Yes   Pain Score 1    Pain Location Back   Pain Orientation Left;Lower   Pain Descriptors / Indicators Aching;Tightness   Pain Type Chronic pain   Pain Onset More than a month ago   Pain Frequency Intermittent   Aggravating Factors  morning hours, activity    Pain Relieving Factors medication, sitting down                         [facility] Adult PT Treatment/Exercise - 02/27/17 0001      Lumbar Exercises: Stretches   Active Hamstring Stretch 3 reps;20 seconds   Piriformis Stretch 3  reps;20 seconds   Piriformis Stretch Limitations butterfly stretch 3x20"     Knee/Hip Exercises: Aerobic   Nustep Level 2x 6 minutes  PT present to discuss progress     Knee/Hip Exercises: Seated   Sit to Sand 10 reps;without UE support;2 sets  abdominal bracing     Manual Therapy   Manual Therapy Soft tissue mobilization   Soft tissue mobilization adductors and glutes Left side - sidelying and supine                  PT Short Term Goals - 02/27/17 1153      PT SHORT TERM GOAL #2   Title walk for 8 minutes with pain decreased >/= 25%   Status Achieved           PT Long Term Goals - 02/13/17 1457      PT LONG TERM GOAL #1   Title Independent with HEP   Time 8   Period Weeks   Status On-going     PT LONG TERM GOAL #2   Title walk for longer for 10-15 minutes with left hip pain decreased >/= 50%   Time 8   Period Weeks   Status On-going     PT LONG TERM GOAL #  3   Title The patient will have improved hip abduction and extension strength to 4/5 needed to rise from a chair with greater ease   Time 8   Period Weeks     PT LONG TERM GOAL #4   Title The patient will report a 60% improvement in sleep   Time 8   Period Weeks   Status On-going     PT LONG TERM GOAL #5   Title FOTO functional outcome score improved from 52% to 37% indicating improved function with less pain   Time 8   Period Weeks   Status On-going               Plan - 02/27/17 1155    Clinical Impression Statement Pt reports 40% overall improvement in pain since the start of care.  Pt with LE stiffness and core/hip weakness and is working to exercise at home to address this.  Pt with tension and trigger points in Lt hip adductors and gluteals and demonstrated immproved mobility after manual therapy today.  Pt will continue to benefit from skilled PT for hip and core strength, flexibility and manual therapy.     Rehab Potential Good   PT Frequency 2x / week   PT Duration 8 weeks    PT Treatment/Interventions Electrical Stimulation;Iontophoresis 4mg /ml Dexamethasone;Gait training;Ultrasound;Therapeutic activities;Moist Heat;Therapeutic exercise;Balance training;Neuromuscular re-education;Patient/family education;Passive range of motion;Manual techniques;Dry needling;Energy conservation   PT Next Visit Plan Hip and core strength, manual if helpful.  Dry needling to Lt gluteals?-pt will consider for next week   Consulted and Agree with Plan of Care Patient      Patient will benefit from skilled therapeutic intervention in order to improve the following deficits and impairments:  Abnormal gait, Decreased range of motion, Difficulty walking, Increased fascial restricitons, Pain, Decreased mobility, Decreased strength, Impaired flexibility, Decreased activity tolerance  Visit Diagnosis: Muscle weakness (generalized)  Pain in left hip  Other abnormalities of gait and mobility  Chronic left-sided low back pain with left-sided sciatica     Problem List Patient Active Problem List   Diagnosis Date Noted  . Varicose veins of lower extremities with other complications 123XX123     Sigurd Sos, PT 02/27/17 12:11 PM  [facility] [address], [place], [place], [address] Phone: [phone]   Fax:  [phone]  Name: Karen Bonilla MRN: [id number] Date of Birth: May 23, 1941

## 2017-03-01 ENCOUNTER — Encounter: Payer: Medicare Other | Admitting: Physical Therapy

## 2017-03-02 ENCOUNTER — Ambulatory Visit: Payer: Medicare Other | Admitting: Physical Therapy

## 2017-03-02 DIAGNOSIS — M5442 Lumbago with sciatica, left side: Secondary | ICD-10-CM

## 2017-03-02 DIAGNOSIS — M25552 Pain in left hip: Secondary | ICD-10-CM

## 2017-03-02 DIAGNOSIS — R2689 Other abnormalities of gait and mobility: Secondary | ICD-10-CM

## 2017-03-02 DIAGNOSIS — G8929 Other chronic pain: Secondary | ICD-10-CM

## 2017-03-02 DIAGNOSIS — M6281 Muscle weakness (generalized): Secondary | ICD-10-CM

## 2017-03-02 NOTE — Therapy (Signed)
[facility] [address], [place], [place], [address] Phone: [phone]   Fax:  [phone]  Physical Therapy Treatment  Patient Details  Name: Karen Bonilla MRN: [id number] Date of Birth: August 04, 1941 Referring Provider: Dr. Leighton Ruff  Encounter Date: 03/02/2017      PT End of Session - 03/02/17 1041    Visit Number 6   Number of Visits 10   Date for PT Re-Evaluation 04/03/17   Authorization Type Medicare G codes;  KX   PT Start Time 1016   PT Stop Time 1100   PT Time Calculation (min) 44 min   Activity Tolerance Patient tolerated treatment well   Behavior During Therapy [facility] for tasks assessed/performed      Past Medical History:  Diagnosis Date  . Hyperlipidemia     Past Surgical History:  Procedure Laterality Date  . ABDOMINAL HYSTERECTOMY    . MASS EXCISION  01/11/2012   Procedure: MINOR EXCISION OF MASS;  Surgeon: Cammie Sickle., MD;  Location: [place];  Service: Orthopedics;  Laterality: Right;  excision mucoid cyst right index and dip joint debridement    There were no vitals filed for this visit.      Subjective Assessment - 03/02/17 1021    Subjective Right leg has seemed to be bothering more than the left the past two days   Currently in Pain? Yes   Pain Score 2    Pain Location Back   Pain Orientation Right;Left;Lower   Pain Descriptors / Indicators Aching   Pain Type Chronic pain   Pain Onset More than a month ago   Pain Frequency Intermittent   Aggravating Factors  standing up and walking   Pain Relieving Factors medication, rest   Multiple Pain Sites No                         [facility] Adult PT Treatment/Exercise - 03/02/17 0001      Lumbar Exercises: Stretches   Active Hamstring Stretch 3 reps;20 seconds   Piriformis Stretch 3 reps;20 seconds     Knee/Hip Exercises: Aerobic   Nustep Level 2x 6 minutes  PT present to discuss progress      Knee/Hip Exercises: Seated   Sit to Sand 10 reps;without UE support;2 sets  abdominal bracing, 10x red band for glute activation     Knee/Hip Exercises: Supine   Bridges Strengthening;Both;10 reps  5 sec hold, with red band     Manual Therapy   Manual Therapy Soft tissue mobilization   Soft tissue mobilization adductors, hip flexors, glutes Left side - sidelying and supine                  PT Short Term Goals - 02/27/17 1153      PT SHORT TERM GOAL #2   Title walk for 8 minutes with pain decreased >/= 25%   Status Achieved           PT Long Term Goals - 03/02/17 1038      PT LONG TERM GOAL #1   Title Independent with HEP   Time 8   Period Weeks   Status On-going     PT LONG TERM GOAL #2   Title walk for longer for 10-15 minutes with left hip pain decreased >/= 50%   Time 8   Period Weeks   Status On-going     PT LONG TERM GOAL #3  Title The patient will have improved hip abduction and extension strength to 4/5 needed to rise from a chair with greater ease   Baseline 5x sit to stand test - 21 sec   Time 8   Period Weeks   Status On-going     PT LONG TERM GOAL #4   Title The patient will report a 60% improvement in sleep   Baseline 50% improvement in sleeping   Time 8   Period Weeks   Status On-going     PT LONG TERM GOAL #5   Title FOTO functional outcome score improved from 52% to 37% indicating improved function with less pain   Time 8   Period Weeks               Plan - 03/02/17 1042    Clinical Impression Statement Patient had tight iliopsoas bilaterally, adductors on left, glutes and piriformis on right today.  Responded well to manual.  Pt able to perform sit to stand, needed cues to use glutes more.  demonstrates glute weakness with medial knee collapse during sit to stand.  Pt will benefit from skilled PT for reduced muscle spasms for pain managemnet and strengthening for improved functional activities.   Rehab Potential Good    PT Treatment/Interventions Electrical Stimulation;Iontophoresis 4mg /ml Dexamethasone;Gait training;Ultrasound;Therapeutic activities;Moist Heat;Therapeutic exercise;Balance training;Neuromuscular re-education;Patient/family education;Passive range of motion;Manual techniques;Dry needling;Energy conservation   PT Next Visit Plan Hip and core strength, manual if helpful.  Dry needling to Lt gluteals?-pt will consider for next week   Consulted and Agree with Plan of Care Patient      Patient will benefit from skilled therapeutic intervention in order to improve the following deficits and impairments:  Abnormal gait, Decreased range of motion, Difficulty walking, Increased fascial restricitons, Pain, Decreased mobility, Decreased strength, Impaired flexibility, Decreased activity tolerance  Visit Diagnosis: Muscle weakness (generalized)  Pain in left hip  Other abnormalities of gait and mobility  Chronic left-sided low back pain with left-sided sciatica     Problem List Patient Active Problem List   Diagnosis Date Noted  . Varicose veins of lower extremities with other complications 66/59/9357    Zannie Cove, PT 03/02/2017, 4:58 PM  [facility] [address] [place], [place], [address] Phone: [phone]   Fax:  [phone]  Name: Karen Bonilla MRN: [id number] Date of Birth: 1941/10/27

## 2017-03-07 DIAGNOSIS — M67432 Ganglion, left wrist: Secondary | ICD-10-CM | POA: Insufficient documentation

## 2017-03-08 ENCOUNTER — Ambulatory Visit: Payer: Medicare Other

## 2017-03-08 VITALS — BP 146/84

## 2017-03-08 DIAGNOSIS — R2689 Other abnormalities of gait and mobility: Secondary | ICD-10-CM

## 2017-03-08 DIAGNOSIS — M6281 Muscle weakness (generalized): Secondary | ICD-10-CM

## 2017-03-08 DIAGNOSIS — M5442 Lumbago with sciatica, left side: Secondary | ICD-10-CM

## 2017-03-08 DIAGNOSIS — M25552 Pain in left hip: Secondary | ICD-10-CM

## 2017-03-08 DIAGNOSIS — G8929 Other chronic pain: Secondary | ICD-10-CM

## 2017-03-08 NOTE — Therapy (Signed)
[facility] [address], [place], [place], [address] Phone: [phone]   Fax:  [phone]  Physical Therapy Treatment  Patient Details  Name: Karen Bonilla MRN: [id number] Date of Birth: Sep 02, 1941 Referring Provider: Dr. Leighton Ruff  Encounter Date: 03/08/2017      PT End of Session - 03/08/17 1229    Visit Number 7   Number of Visits 10   Date for PT Re-Evaluation 04/03/17   Authorization Type Medicare G codes;  KX   PT Start Time 1145  dry needling   PT Stop Time 1228   PT Time Calculation (min) 43 min   Activity Tolerance Patient tolerated treatment well   Behavior During Therapy [facility] for tasks assessed/performed      Past Medical History:  Diagnosis Date  . Hyperlipidemia     Past Surgical History:  Procedure Laterality Date  . ABDOMINAL HYSTERECTOMY    . MASS EXCISION  01/11/2012   Procedure: MINOR EXCISION OF MASS;  Surgeon: Cammie Sickle., MD;  Location: [place];  Service: Orthopedics;  Laterality: Right;  excision mucoid cyst right index and dip joint debridement    Vitals:   03/08/17 1149  BP: (!) 146/84        Subjective Assessment - 03/08/17 1149    Subjective Ready to try dry needling.     Currently in Pain? Yes   Pain Score 2    Pain Location Back   Pain Orientation Right;Left;Lower   Pain Descriptors / Indicators Aching   Pain Type Chronic pain   Pain Radiating Towards Lt gluteals and adductors   Pain Onset More than a month ago   Pain Frequency Intermittent   Aggravating Factors  standing and walking   Pain Relieving Factors medication, rest                         [facility] Adult PT Treatment/Exercise - 03/08/17 0001      Knee/Hip Exercises: Aerobic   Nustep Level 2x  8 minutes  PT present to discuss progress     Manual Therapy   Manual Therapy Soft tissue mobilization   Soft tissue mobilization Rt gluteals and Lt hip adductors  with trigger point release after dry needling.          Trigger Point Dry Needling - 03/08/17 1200    Consent Given? Yes   Education Handout Provided Yes   Muscles Treated Lower Body Gluteus minimus;Piriformis;Adductor longus/brevius/maximus  Rt only glutes, Lt only adductors   Gluteus Minimus Response Twitch response elicited;Palpable increased muscle length   Piriformis Response Twitch response elicited;Palpable increased muscle length   Adductor Response Twitch response elicited;Palpable increased muscle length              PT Education - 03/08/17 1200    Education provided Yes   Education Details DN info   Person(s) Educated Patient   Methods Explanation;Demonstration;Handout   Comprehension Verbalized understanding;Returned demonstration          PT Short Term Goals - 02/27/17 1153      PT SHORT TERM GOAL #2   Title walk for 8 minutes with pain decreased >/= 25%   Status Achieved           PT Long Term Goals - 03/08/17 1150      PT LONG TERM GOAL #1   Title Independent with HEP   Time 8   Period Weeks  Status On-going     PT LONG TERM GOAL #2   Title walk for longer for 10-15 minutes with left hip pain decreased >/= 50%   Time 8   Period Weeks   Status On-going     PT LONG TERM GOAL #4   Title The patient will report a 60% improvement in sleep   Status Achieved               Plan - 03/08/17 1151    Clinical Impression Statement Pt reports 50-60% reduction in pain since the start of care.  Pt with trigger points in Lt gluteals and adductors and demonstrated improved tissue mobility after dry needling and manual today.  Pt will continue to benefit from skilled PT for dry needling, manual and strength/flexibility exercises.     Rehab Potential Good   PT Frequency 2x / week   PT Duration 8 weeks   PT Treatment/Interventions Electrical Stimulation;Iontophoresis 4mg /ml Dexamethasone;Gait training;Ultrasound;Therapeutic activities;Moist  Heat;Therapeutic exercise;Balance training;Neuromuscular re-education;Patient/family education;Passive range of motion;Manual techniques;Dry needling;Energy conservation   PT Next Visit Plan Hip and core strength, manual if helpful.  Assess response to dry needling   Consulted and Agree with Plan of Care Patient      Patient will benefit from skilled therapeutic intervention in order to improve the following deficits and impairments:  Abnormal gait, Decreased range of motion, Difficulty walking, Increased fascial restricitons, Pain, Decreased mobility, Decreased strength, Impaired flexibility, Decreased activity tolerance  Visit Diagnosis: Muscle weakness (generalized)  Pain in left hip  Other abnormalities of gait and mobility  Chronic left-sided low back pain with left-sided sciatica     Problem List Patient Active Problem List   Diagnosis Date Noted  . Varicose veins of lower extremities with other complications 01/24/4387     Sigurd Sos, PT 03/08/17 12:30 PM  [facility] [address] [place], [place], [address] Phone: [phone]   Fax:  [phone]  Name: Karen Bonilla MRN: [id number] Date of Birth: [birth year]

## 2017-03-08 NOTE — Patient Instructions (Addendum)

## 2017-03-13 ENCOUNTER — Ambulatory Visit: Payer: Medicare Other

## 2017-03-13 DIAGNOSIS — M5442 Lumbago with sciatica, left side: Secondary | ICD-10-CM

## 2017-03-13 DIAGNOSIS — M6281 Muscle weakness (generalized): Secondary | ICD-10-CM | POA: Diagnosis not present

## 2017-03-13 DIAGNOSIS — M25552 Pain in left hip: Secondary | ICD-10-CM

## 2017-03-13 DIAGNOSIS — G8929 Other chronic pain: Secondary | ICD-10-CM

## 2017-03-13 DIAGNOSIS — R2689 Other abnormalities of gait and mobility: Secondary | ICD-10-CM

## 2017-03-13 NOTE — Therapy (Addendum)
[facility] [address] [place], [place], [address] Phone: [phone]   Fax:  [phone]  Physical Therapy Treatment  Patient Details  Name: Karen Bonilla MRN: [id number] Date of Birth: [birth year] Referring Provider: Dr. Leighton Ruff  Encounter Date: 03/13/2017      PT End of Session - 03/13/17 1309    Visit Number 8   Number of Visits 10   Date for PT Re-Evaluation 04/03/17   Authorization Type Medicare G codes;  KX   PT Start Time 1230  dry needling   PT Stop Time 3810   PT Time Calculation (min) 43 min   Activity Tolerance Patient tolerated treatment well   Behavior During Therapy [facility] for tasks assessed/performed      Past Medical History:  Diagnosis Date  . Hyperlipidemia     Past Surgical History:  Procedure Laterality Date  . ABDOMINAL HYSTERECTOMY    . MASS EXCISION  01/11/2012   Procedure: MINOR EXCISION OF MASS;  Surgeon: Cammie Sickle., MD;  Location: [place];  Service: Orthopedics;  Laterality: Right;  excision mucoid cyst right index and dip joint debridement    There were no vitals filed for this visit.      Subjective Assessment - 03/13/17 1234    Subjective Pt reports that Lt adductor is feeling much better after dry needling last session.  50% better.  No change in the Rt buttock.    Patient Stated Goals pain to go away, avoid surgery if possible   Currently in Pain? Yes   Pain Score 4    Pain Location Back   Pain Orientation Right;Left;Lower   Pain Descriptors / Indicators Aching;Tightness   Pain Type Chronic pain   Pain Radiating Towards Lt gluteals and adductors   Pain Onset More than a month ago   Pain Frequency Intermittent   Aggravating Factors  standing and walking, overdoing it with housework, sitting too much to watch basketball   Pain Relieving Factors medication, rest                         [facility] Adult PT  Treatment/Exercise - 03/13/17 0001      Lumbar Exercises: Stretches   Single Knee to Chest Stretch 3 reps;20 seconds   Piriformis Stretch Limitations butterfly stretch 3x20 seconds     Knee/Hip Exercises: Aerobic   Nustep Level 2x  8 minutes  PT present to discuss progress     Manual Therapy   Manual Therapy Soft tissue mobilization   Soft tissue mobilization Rt gluteals and Lt hip adductors with trigger point release after dry needling.          Trigger Point Dry Needling - 03/13/17 1241    Consent Given? Yes   Education Handout Provided Yes   Muscles Treated Lower Body Gluteus minimus;Piriformis;Adductor longus/brevius/maximus  Rt gluteals and Lt adductors   Gluteus Minimus Response Twitch response elicited;Palpable increased muscle length   Piriformis Response Twitch response elicited;Palpable increased muscle length   Adductor Response Twitch response elicited;Palpable increased muscle length                PT Short Term Goals - 02/27/17 1153      PT SHORT TERM GOAL #2   Title walk for 8 minutes with pain decreased >/= 25%   Status Achieved           PT Long Term Goals - 03/13/17 1236  PT LONG TERM GOAL #1   Title Independent with HEP   Time 8   Period Weeks   Status On-going     PT LONG TERM GOAL #2   Title walk for longer for 10-15 minutes with left hip pain decreased >/= 50%   Time 8   Period Weeks   Status On-going     PT LONG TERM GOAL #3   Title The patient will have improved hip abduction and extension strength to 4/5 needed to rise from a chair with greater ease   Time 8   Status On-going     PT LONG TERM GOAL #4   Title The patient will report a 60% improvement in sleep   Status Achieved               Plan - 03/13/17 1237    Clinical Impression Statement Pt reports 50% reduction in Lt adductor pain after dry needling last session.  No change in Rt gluteal pain but has been active and might have aggravated it. Pt with  increased Rt knee to chest A/ROM after dry needling last session.   Pt with active trigger points in Lt adductors and Rt gluteals and demonstrated improved tissue mobility after dry needling and manual therapy today.  Pt will continue to benefit from skilled PT for strength, flexibility and manual to reduce pain,   Rehab Potential Good   PT Frequency 2x / week   PT Duration 8 weeks   PT Treatment/Interventions Electrical Stimulation;Iontophoresis 4mg /ml Dexamethasone;Gait training;Ultrasound;Therapeutic activities;Moist Heat;Therapeutic exercise;Balance training;Neuromuscular re-education;Patient/family education;Passive range of motion;Manual techniques;Dry needling;Energy conservation   PT Next Visit Plan Hip and core strength, manual if helpful.  Assess response to dry needling   Consulted and Agree with Plan of Care Patient      Patient will benefit from skilled therapeutic intervention in order to improve the following deficits and impairments:  Abnormal gait, Decreased range of motion, Difficulty walking, Increased fascial restricitons, Pain, Decreased mobility, Decreased strength, Impaired flexibility, Decreased activity tolerance  Visit Diagnosis: Muscle weakness (generalized)  Pain in left hip  Other abnormalities of gait and mobility  Chronic left-sided low back pain with left-sided sciatica     Problem List Patient Active Problem List   Diagnosis Date Noted  . Varicose veins of lower extremities with other complications 12/87/8676     Sigurd Sos, PT 03/13/17 1:18 PM  [facility] [address] [place], [place], [address] Phone: [phone]   Fax:  [phone]  Name: Karen Bonilla MRN: [id number] Date of Birth: November 09, 1941

## 2017-03-15 ENCOUNTER — Ambulatory Visit: Payer: Medicare Other | Admitting: Physical Therapy

## 2017-03-15 DIAGNOSIS — G8929 Other chronic pain: Secondary | ICD-10-CM

## 2017-03-15 DIAGNOSIS — R2689 Other abnormalities of gait and mobility: Secondary | ICD-10-CM

## 2017-03-15 DIAGNOSIS — M5442 Lumbago with sciatica, left side: Secondary | ICD-10-CM

## 2017-03-15 DIAGNOSIS — M25552 Pain in left hip: Secondary | ICD-10-CM

## 2017-03-15 DIAGNOSIS — M6281 Muscle weakness (generalized): Secondary | ICD-10-CM | POA: Diagnosis not present

## 2017-03-15 NOTE — Therapy (Signed)
[facility] [address] [place], [place], [address] Phone: [phone]   Fax:  [phone]  Physical Therapy Treatment  Patient Details  Name: Karen Bonilla MRN: [id number] Date of Birth: 08-26-1941 Referring Provider: Dr. Leighton Ruff  Encounter Date: 03/15/2017      PT End of Session - 03/15/17 1538    Visit Number 9   Number of Visits 10   Date for PT Re-Evaluation 04/03/17   Authorization Type Medicare G codes;  KX   PT Start Time 7353   PT Stop Time 1530   PT Time Calculation (min) 45 min   Activity Tolerance Patient tolerated treatment well      Past Medical History:  Diagnosis Date  . Hyperlipidemia     Past Surgical History:  Procedure Laterality Date  . ABDOMINAL HYSTERECTOMY    . MASS EXCISION  01/11/2012   Procedure: MINOR EXCISION OF MASS;  Surgeon: Cammie Sickle., MD;  Location: [place];  Service: Orthopedics;  Laterality: Right;  excision mucoid cyst right index and dip joint debridement    There were no vitals filed for this visit.      Subjective Assessment - 03/15/17 1452    Subjective The dry needling helps, not overly sore.  The adductor area needling especially helped.  Twisting will sometimes give me a jolt.  My walking is better than when I started but not as good as I want.  It feels like my legs won't move.     Currently in Pain? Yes   Pain Location Back   Pain Orientation Right;Left;Lower   Pain Descriptors / Indicators Aching   Pain Type Chronic pain                         [facility] Adult PT Treatment/Exercise - 03/15/17 0001      Therapeutic Activites    Other Therapeutic Activities standing, walking, sit to stand     Neuro Re-ed    Neuro Re-ed Details  transverse abdominus activation     Lumbar Exercises: Stretches   Single Knee to Chest Stretch 3 reps;20 seconds   Single Knee to Chest Stretch Limitations seated forward hands  between knees stretch with right and left bias with hip hinge method     Lumbar Exercises: Standing   Row Strengthening;20 reps   Row Limitations 20#   Shoulder Extension Strengthening   Shoulder Extension Limitations 20# 20x     Knee/Hip Exercises: Aerobic   Nustep Level 1x  10 minutes  PT present to discuss progress     Knee/Hip Exercises: Standing   Hip Extension Stengthening;Right;Left;15 reps   Extension Limitations red band   Walking with Sports Cord resisted walking backward walking 30# 10x     Manual Therapy   Manual Therapy Manual Traction   Joint Mobilization bilateral hip long axis distraction, inferior, distraction 30 sec 3-6 x each   Manual Traction belt distraction of lumbarpelvic region 10x 10 sec                  PT Short Term Goals - 03/15/17 1541      PT SHORT TERM GOAL #1   Title independent with initial HEP   Status Achieved     PT SHORT TERM GOAL #2   Title walk for 8 minutes with pain decreased >/= 25%   Status Achieved     PT SHORT TERM GOAL #3  Title Patient will report a 25% improvement in sleep   Status Achieved           PT Long Term Goals - 03/15/17 1541      PT LONG TERM GOAL #1   Title Independent with HEP   Time 8   Period Weeks   Status On-going     PT LONG TERM GOAL #2   Title walk for longer for 10-15 minutes with left hip pain decreased >/= 50%   Time 8   Period Weeks   Status On-going     PT LONG TERM GOAL #3   Title The patient will have improved hip abduction and extension strength to 4/5 needed to rise from a chair with greater ease   Time 8   Period Weeks   Status On-going     PT LONG TERM GOAL #4   Title The patient will report a 60% improvement in sleep   Status Achieved     PT LONG TERM GOAL #5   Title FOTO functional outcome score improved from 52% to 37% indicating improved function with less pain   Time 8   Period Weeks   Status On-going               Plan - 03/15/17 1538     Clinical Impression Statement Patient reports dry needling is helpful especially with left hip adductors.  Patient is still limited with left knee to chest motion in supine and seated positions.  Verbal cues to activate abdominals in standing.  No increased pain with standing exercises.  Patient reports she feels she is standing more erect after treatment session.     PT Next Visit Plan Hip and core strength, manual traction if helpful; hip mobs;   dry needling;  G code next visit;  FOTO      Patient will benefit from skilled therapeutic intervention in order to improve the following deficits and impairments:     Visit Diagnosis: Muscle weakness (generalized)  Pain in left hip  Other abnormalities of gait and mobility  Chronic left-sided low back pain with left-sided sciatica     Problem List Patient Active Problem List   Diagnosis Date Noted  . Varicose veins of lower extremities with other complications 23/34/3568   Ruben Im, PT 03/15/17 3:44 PM Phone: [phone] Fax: [phone]  [name] 03/15/2017, 3:44 PM  [facility] [address] [place], [place], [address] Phone: [phone]   Fax:  [phone]  Name: LITA FLYNN MRN: [id number] Date of Birth: 1941/03/03

## 2017-03-19 ENCOUNTER — Ambulatory Visit: Payer: Medicare Other

## 2017-03-19 DIAGNOSIS — G8929 Other chronic pain: Secondary | ICD-10-CM

## 2017-03-19 DIAGNOSIS — M25552 Pain in left hip: Secondary | ICD-10-CM

## 2017-03-19 DIAGNOSIS — M6281 Muscle weakness (generalized): Secondary | ICD-10-CM | POA: Diagnosis not present

## 2017-03-19 DIAGNOSIS — M5442 Lumbago with sciatica, left side: Secondary | ICD-10-CM

## 2017-03-19 DIAGNOSIS — R2689 Other abnormalities of gait and mobility: Secondary | ICD-10-CM

## 2017-03-19 NOTE — Therapy (Signed)
[facility] [address] [place], [place], [address] Phone: [phone]   Fax:  [phone]  Physical Therapy Treatment  Patient Details  Name: Karen Bonilla MRN: [id number] Date of Birth: 1941/08/27 Referring Provider: Dr. Leighton Ruff  Encounter Date: 03/19/2017      PT End of Session - 03/19/17 1311    Visit Number 10   Number of Visits 20   Date for PT Re-Evaluation 04/03/17   Authorization Type Medicare G codes;  KX   PT Start Time 1231/04/10   PT Stop Time 1313   PT Time Calculation (min) 41 min   Activity Tolerance Patient tolerated treatment well   Behavior During Therapy [facility] for tasks assessed/performed      Past Medical History:  Diagnosis Date  . Hyperlipidemia     Past Surgical History:  Procedure Laterality Date  . ABDOMINAL HYSTERECTOMY    . MASS EXCISION  01/11/2012   Procedure: MINOR EXCISION OF MASS;  Surgeon: Cammie Sickle., MD;  Location: [place];  Service: Orthopedics;  Laterality: Right;  excision mucoid cyst right index and dip joint debridement    There were no vitals filed for this visit.      Subjective Assessment - 03/19/17 1234    Subjective My Lt adductor started acting up again this weekend.     Currently in Pain? Yes   Pain Score 2    Pain Location Back   Pain Orientation Left;Right;Lower   Pain Descriptors / Indicators Aching   Pain Type Chronic pain   Pain Onset More than a month ago   Pain Frequency Intermittent   Aggravating Factors  standing and walking, over doing it, sitting too long   Pain Relieving Factors medication, rest            [facility] PT Assessment - 03/19/17 0001      Observation/Other Assessments   Focus on Therapeutic Outcomes (FOTO)  54% limitation                     [facility] Adult PT Treatment/Exercise - 03/19/17 0001      Lumbar Exercises: Stretches   Active Hamstring Stretch 3 reps;20 seconds   Single  Knee to Chest Stretch 3 reps;20 seconds   Piriformis Stretch 3 reps;20 seconds   Piriformis Stretch Limitations butterfly stretch 3x20 seconds          Trigger Point Dry Needling - 03/19/17 1239    Consent Given? Yes   Education Handout Provided Yes   Muscles Treated Lower Body Gluteus minimus;Piriformis;Adductor longus/brevius/maximus  Rt gluteals and Lt adductors   Gluteus Minimus Response Twitch response elicited;Palpable increased muscle length   Piriformis Response Twitch response elicited;Palpable increased muscle length   Adductor Response Twitch response elicited;Palpable increased muscle length                PT Short Term Goals - 03/19/17 1237      PT SHORT TERM GOAL #1   Title independent with initial HEP   Status Achieved     PT SHORT TERM GOAL #2   Title walk for 8 minutes with pain decreased >/= 25%   Status Achieved     PT SHORT TERM GOAL #3   Title Patient will report a 25% improvement in sleep   Status Achieved           PT Long Term Goals - 03/19/17 1237      PT LONG TERM  GOAL #1   Title Independent with HEP   Time 8   Period Weeks   Status On-going     PT LONG TERM GOAL #2   Title walk for longer for 10-15 minutes with left hip pain decreased >/= 50%   Time 8   Period Weeks   Status On-going     PT LONG TERM GOAL #3   Title The patient will have improved hip abduction and extension strength to 4/5 needed to rise from a chair with greater ease     PT LONG TERM GOAL #4   Title The patient will report a 60% improvement in sleep   Status Achieved               Plan - 04-12-17 1240    Clinical Impression Statement (P)  Pt reports 40-60% overall improvement in symptoms since the start of care. Pt with continued trigger points in Lt adductors and Rt gluteals and demonstrated improved mobility and reduced pain after needling and manual therapy today.  Pt will with improved Rt hip A/ROM vs the Lt.  Pt will continue to benefit from  skilled PT for strength, flexiblity and manual therapy.     Rehab Potential (P)  Good   PT Frequency (P)  2x / week   PT Duration (P)  8 weeks   PT Treatment/Interventions (P)  Electrical Stimulation;Iontophoresis 4mg /ml Dexamethasone;Gait training;Ultrasound;Therapeutic activities;Moist Heat;Therapeutic exercise;Balance training;Neuromuscular re-education;Patient/family education;Passive range of motion;Manual techniques;Dry needling;Energy conservation   PT Next Visit Plan (P)  Hip and core strength, manual traction if helpful; hip mobs;   dry needling (1-2 more)   Consulted and Agree with Plan of Care (P)  Patient      Patient will benefit from skilled therapeutic intervention in order to improve the following deficits and impairments:  (P) Abnormal gait, Decreased range of motion, Difficulty walking, Increased fascial restricitons, Pain, Decreased mobility, Decreased strength, Impaired flexibility, Decreased activity tolerance  Visit Diagnosis: Muscle weakness (generalized)  Pain in left hip  Other abnormalities of gait and mobility  Chronic left-sided low back pain with left-sided sciatica       G-Codes - 12-Apr-2017 1238    Functional Assessment Tool Used (Outpatient Only) FOTO and clinical   Functional Limitation Mobility: Walking and moving around   Mobility: Walking and Moving Around Current Status [phone]) At least 40 percent but less than 60 percent impaired, limited or restricted   Mobility: Walking and Moving Around Goal Status [phone]) At least 40 percent but less than 60 percent impaired, limited or restricted      Problem List Patient Active Problem List   Diagnosis Date Noted  . Varicose veins of lower extremities with other complications 29/79/8921    Sigurd Sos, PT 2017/04/12 1:12 PM  [facility] [address] [place], [place], [address] Phone: [phone]   Fax:  [phone]  Name: Karen Bonilla MRN: [id number] Date of Birth: 05/24/1941

## 2017-03-21 ENCOUNTER — Encounter: Payer: Self-pay | Admitting: Physical Therapy

## 2017-03-21 ENCOUNTER — Ambulatory Visit: Payer: Medicare Other | Admitting: Physical Therapy

## 2017-03-21 DIAGNOSIS — M25552 Pain in left hip: Secondary | ICD-10-CM

## 2017-03-21 DIAGNOSIS — M6281 Muscle weakness (generalized): Secondary | ICD-10-CM

## 2017-03-21 DIAGNOSIS — M5442 Lumbago with sciatica, left side: Secondary | ICD-10-CM

## 2017-03-21 DIAGNOSIS — R2689 Other abnormalities of gait and mobility: Secondary | ICD-10-CM

## 2017-03-21 DIAGNOSIS — G8929 Other chronic pain: Secondary | ICD-10-CM

## 2017-03-21 NOTE — Therapy (Signed)
[facility] [address], [place], [place], [address] Phone: [phone]   Fax:  [phone]  Physical Therapy Treatment  Patient Details  Name: Karen Bonilla MRN: [id number] Date of Birth: 28-May-1941 Referring Provider: Dr. Leighton Ruff  Encounter Date: 03/21/2017      PT End of Session - 03/21/17 1317    Visit Number 11   Number of Visits 20   Date for PT Re-Evaluation 04/03/17   Authorization Type Medicare G codes;  KX   PT Start Time 03-12-1231   PT Stop Time 1315   PT Time Calculation (min) 43 min   Activity Tolerance Patient tolerated treatment well   Behavior During Therapy [facility] for tasks assessed/performed      Past Medical History:  Diagnosis Date  . Hyperlipidemia     Past Surgical History:  Procedure Laterality Date  . ABDOMINAL HYSTERECTOMY    . MASS EXCISION  01/11/2012   Procedure: MINOR EXCISION OF MASS;  Surgeon: Cammie Sickle., MD;  Location: [place];  Service: Orthopedics;  Laterality: Right;  excision mucoid cyst right index and dip joint debridement    There were no vitals filed for this visit.      Subjective Assessment - 03/21/17 1234    Subjective Last night felt the glutes sending out "pings" like she has during the dry needling which was a new thing.  Adductors still "catch" on the left groin area.  Pt denies pain currently.   Patient Stated Goals pain to go away, avoid surgery if possible   Currently in Pain? No/denies                         [facility] Adult PT Treatment/Exercise - 03/21/17 0001      Lumbar Exercises: Stretches   Single Knee to Chest Stretch 3 reps;20 seconds     Knee/Hip Exercises: Seated   Other Seated Knee/Hip Exercises hip external and internal rotation - red band - 20x each      Manual Therapy   Joint Mobilization bilateral hip long axis distraction, inferior, distraction 30 sec 3-6 x each, lateral mobs   Soft tissue  mobilization left hip adductors, left glutes, left psoas, right psoas   Muscle Energy Technique right isometric extension to correct anterior rotation                PT Education - 03/21/17 1317    Education provided Yes   Education Details hip external rotation in sitting   Person(s) Educated Patient   Methods Explanation;Demonstration;Tactile cues;Verbal cues;Handout   Comprehension Verbalized understanding;Returned demonstration          PT Short Term Goals - 03/19/17 1237      PT SHORT TERM GOAL #1   Title independent with initial HEP   Status Achieved     PT SHORT TERM GOAL #2   Title walk for 8 minutes with pain decreased >/= 25%   Status Achieved     PT SHORT TERM GOAL #3   Title Patient will report a 25% improvement in sleep   Status Achieved           PT Long Term Goals - 03/19/17 1237      PT LONG TERM GOAL #1   Title Independent with HEP   Time 8   Period Weeks   Status On-going     PT LONG TERM GOAL #2   Title walk for longer  for 10-15 minutes with left hip pain decreased >/= 50%   Time 8   Period Weeks   Status On-going     PT LONG TERM GOAL #3   Title The patient will have improved hip abduction and extension strength to 4/5 needed to rise from a chair with greater ease     PT LONG TERM GOAL #4   Title The patient will report a 60% improvement in sleep   Status Achieved               Plan - 03/21/17 1406    Clinical Impression Statement Pt had anterior rotation on right side of pelvis which improved with MET.  Pt demonstrates some decreased pain with resisted external rotation of left hip and was added to HEP.  responded well with manual to reduce left adductor and right psoas muscle spasms.  Pt continues to have muscle spasms and difficulty with postural alignment and stability of core.  Pt will benefit from skilled PT to continue working on improved posture and stability   Rehab Potential Good   PT Treatment/Interventions  Electrical Stimulation;Iontophoresis '4mg'$ /ml Dexamethasone;Gait training;Ultrasound;Therapeutic activities;Moist Heat;Therapeutic exercise;Balance training;Neuromuscular re-education;Patient/family education;Passive range of motion;Manual techniques;Dry needling;Energy conservation   PT Next Visit Plan Hip and core strength, manual traction if helpful; hip mobs;   dry needling;  G code next visit   Consulted and Agree with Plan of Care Patient      Patient will benefit from skilled therapeutic intervention in order to improve the following deficits and impairments:  Abnormal gait, Decreased range of motion, Difficulty walking, Increased fascial restricitons, Pain, Decreased mobility, Decreased strength, Impaired flexibility, Decreased activity tolerance  Visit Diagnosis: Muscle weakness (generalized)  Pain in left hip  Other abnormalities of gait and mobility  Chronic left-sided low back pain with left-sided sciatica     Problem List Patient Active Problem List   Diagnosis Date Noted  . Varicose veins of lower extremities with other complications 21/10/5519    Zannie Cove, PT 03/21/2017, 2:18 PM  [facility] [address], [place], [place], [address] Phone: [phone]   Fax:  [phone]  Name: Karen Bonilla MRN: [id number] Date of Birth: [birth year]

## 2017-03-21 NOTE — Patient Instructions (Signed)
(  Clinic) Hip: External Rotation - Sitting    Left side toward pulley, strap around ankle, leg out, sit with knees over edge of table. Pull foot toward body. May hold table to keep body still. Repeat ___10_ times per set. Do ___2_ sets per session. Do __5__ sessions per week. .  Copyright  VHI. All rights reserved.   [place] [address] [place], [place] [address] Phone # [phone] Fax [phone]

## 2017-03-28 ENCOUNTER — Ambulatory Visit: Payer: Medicare Other | Attending: Family Medicine

## 2017-03-28 DIAGNOSIS — G8929 Other chronic pain: Secondary | ICD-10-CM | POA: Insufficient documentation

## 2017-03-28 DIAGNOSIS — M6281 Muscle weakness (generalized): Secondary | ICD-10-CM | POA: Diagnosis present

## 2017-03-28 DIAGNOSIS — M25552 Pain in left hip: Secondary | ICD-10-CM | POA: Diagnosis present

## 2017-03-28 DIAGNOSIS — M5442 Lumbago with sciatica, left side: Secondary | ICD-10-CM | POA: Diagnosis present

## 2017-03-28 DIAGNOSIS — R2689 Other abnormalities of gait and mobility: Secondary | ICD-10-CM

## 2017-03-28 NOTE — Therapy (Signed)
[facility] [address] [place], [place], [address] Phone: [phone]   Fax:  [phone]  Physical Therapy Treatment  Patient Details  Name: Karen Bonilla MRN: [id number] Date of Birth: 03/15/1941 Referring Provider: Dr. Leighton Ruff  Encounter Date: 03/28/2017      PT End of Session - 03/28/17 1138    Visit Number 12   Number of Visits 20   Date for PT Re-Evaluation 04/03/17   Authorization Type Medicare G codes;  KX   PT Start Time Apr 06, 1106   PT Stop Time 1145   PT Time Calculation (min) 38 min   Activity Tolerance Patient tolerated treatment well   Behavior During Therapy [facility] for tasks assessed/performed      Past Medical History:  Diagnosis Date  . Hyperlipidemia     Past Surgical History:  Procedure Laterality Date  . ABDOMINAL HYSTERECTOMY    . MASS EXCISION  01/11/2012   Procedure: MINOR EXCISION OF MASS;  Surgeon: Cammie Sickle., MD;  Location: [place];  Service: Orthopedics;  Laterality: Right;  excision mucoid cyst right index and dip joint debridement    There were no vitals filed for this visit.      Subjective Assessment - 03/28/17 1108    Subjective I am feeling discouraged because I feel like pain is not going away.  Pt reports 60% better since the start of care.     Pertinent History on muscle relaxer ;  water aerobics 4x/week;  hx of right sciatica   Currently in Pain? No/denies                         [facility] Adult PT Treatment/Exercise - 03/28/17 0001      Lumbar Exercises: Stretches   Active Hamstring Stretch 3 reps;20 seconds   Single Knee to Chest Stretch 3 reps;20 seconds   Piriformis Stretch 3 reps;20 seconds   Piriformis Stretch Limitations butterfly stretch 3x20 seconds     Lumbar Exercises: Supine   Bridge 20 reps;5 seconds     Knee/Hip Exercises: Aerobic   Nustep Level 1x  10 minutes  PT present to discuss progress     Manual  Therapy   Manual Therapy Soft tissue mobilization;Myofascial release   Soft tissue mobilization orange roller to mobilize tissue of Lt quads, hip flexors and adductors                  PT Short Term Goals - 03/19/17 1237      PT SHORT TERM GOAL #1   Title independent with initial HEP   Status Achieved     PT SHORT TERM GOAL #2   Title walk for 8 minutes with pain decreased >/= 25%   Status Achieved     PT SHORT TERM GOAL #3   Title Patient will report a 25% improvement in sleep   Status Achieved           PT Long Term Goals - 03/19/17 1237      PT LONG TERM GOAL #1   Title Independent with HEP   Time 8   Period Weeks   Status On-going     PT LONG TERM GOAL #2   Title walk for longer for 10-15 minutes with left hip pain decreased >/= 50%   Time 8   Period Weeks   Status On-going     PT LONG TERM GOAL #3   Title The  patient will have improved hip abduction and extension strength to 4/5 needed to rise from a chair with greater ease     PT LONG TERM GOAL #4   Title The patient will report a 60% improvement in sleep   Status Achieved               Plan - 03/28/17 1114    Clinical Impression Statement Pt reports 60% overall improvement in symptoms since the start of care.  Pt is discouraged as she feels limited and is having pain with activity.  PT will hold on dry needling this week as she is having muscle spasm in Lt adductors over the past week.  Pt will be reassessed next visit as she has an appointment with MD next week.     Rehab Potential Good   PT Frequency 2x / week   PT Duration 8 weeks   PT Treatment/Interventions Electrical Stimulation;Iontophoresis 4mg /ml Dexamethasone;Gait training;Ultrasound;Therapeutic activities;Moist Heat;Therapeutic exercise;Balance training;Neuromuscular re-education;Patient/family education;Passive range of motion;Manual techniques;Dry needling;Energy conservation   PT Next Visit Plan Reassessment next visit-pt  going to MD next week.  No more appointments scheduled.     Consulted and Agree with Plan of Care Patient      Patient will benefit from skilled therapeutic intervention in order to improve the following deficits and impairments:  Abnormal gait, Decreased range of motion, Difficulty walking, Increased fascial restricitons, Pain, Decreased mobility, Decreased strength, Impaired flexibility, Decreased activity tolerance  Visit Diagnosis: Muscle weakness (generalized)  Pain in left hip  Other abnormalities of gait and mobility  Chronic left-sided low back pain with left-sided sciatica     Problem List Patient Active Problem List   Diagnosis Date Noted  . Varicose veins of lower extremities with other complications 80/02/4916     Sigurd Sos, PT 03/28/17 11:40 AM  [facility] [address], [place], [place], [address] Phone: [phone]   Fax:  [phone]  Name: Karen Bonilla MRN: [id number] Date of Birth: [birth year]

## 2017-03-29 ENCOUNTER — Ambulatory Visit: Payer: Medicare Other | Admitting: Physical Therapy

## 2017-03-29 DIAGNOSIS — G8929 Other chronic pain: Secondary | ICD-10-CM

## 2017-03-29 DIAGNOSIS — R2689 Other abnormalities of gait and mobility: Secondary | ICD-10-CM

## 2017-03-29 DIAGNOSIS — M5442 Lumbago with sciatica, left side: Secondary | ICD-10-CM

## 2017-03-29 DIAGNOSIS — M25552 Pain in left hip: Secondary | ICD-10-CM

## 2017-03-29 DIAGNOSIS — M6281 Muscle weakness (generalized): Secondary | ICD-10-CM | POA: Diagnosis not present

## 2017-03-29 NOTE — Therapy (Addendum)
[facility] [address] [place], [place], [address] Phone: [phone]   Fax:  [phone]  Physical Therapy Treatment/Discharge Summary  Patient Details  Name: SCOTLYNN NOYES MRN: [id number] Date of Birth: [birth year] Referring Provider: Dr. Leighton Ruff  Encounter Date: 03/29/2017      PT End of Session - 03/28/17 1138    Visit Number 12   Number of Visits 20   Date for PT Re-Evaluation 04/03/17   Authorization Type Medicare G codes;  KX   PT Start Time 03/04/06   PT Stop Time 1145   PT Time Calculation (min) 38 min   Activity Tolerance Patient tolerated treatment well   Behavior During Therapy [facility] for tasks assessed/performed      Past Medical History:  Diagnosis Date  . Hyperlipidemia     Past Surgical History:  Procedure Laterality Date  . ABDOMINAL HYSTERECTOMY    . MASS EXCISION  01/11/2012   Procedure: MINOR EXCISION OF MASS;  Surgeon: Cammie Sickle., MD;  Location: [place];  Service: Orthopedics;  Laterality: Right;  excision mucoid cyst right index and dip joint debridement    There were no vitals filed for this visit.      Subjective Assessment - 03/29/17 1530    Subjective I feel like PT has been helpful but patient states overall she is about 60% better.  I am peaved though b/c I wish I was better.    I did water aerobics and went to lunch with friends, no pain.  Some days my back and leg really get aggravated.     How long can you walk comfortably? piddling around in the yard I can do 2 hours with some sitting in between   Currently in Pain? No/denies   Pain Score 0-No pain   Pain Type Chronic pain            [facility] PT Assessment - 03/29/17 0001      Observation/Other Assessments   Focus on Therapeutic Outcomes (FOTO)  54% limitation  no limitation     AROM   Right Hip Flexion 140   Left Hip Flexion 115   Lumbar Flexion 75   Lumbar Extension 15   Lumbar -  Right Side Bend 35   Lumbar - Left Side Bend 20     Strength   Right Hip ABduction 4/5   Left Hip ABduction 4/5   Lumbar Extension --  improved activation of transverse abdominals      Flexibility   Hamstrings 90 Bil     Slump test   Findings Positive   Side Left                     [facility] Adult PT Treatment/Exercise - 03/29/17 0001      Therapeutic Activites    Other Therapeutic Activities standing, walking, sit to stand     Neuro Re-ed    Neuro Re-ed Details  transverse abdominus activation     Lumbar Exercises: Supine   Ab Set 5 reps   Bent Knee Raise 5 reps     Knee/Hip Exercises: Aerobic   Nustep L2 12 min  while discussing progress     Knee/Hip Exercises: Sidelying   Hip ABduction AROM;Right;Left;10 reps                  PT Short Term Goals - 03/29/17 1559      PT SHORT TERM GOAL #1  Title independent with initial HEP   Status Achieved     PT SHORT TERM GOAL #2   Title walk for 8 minutes with pain decreased >/= 25%   Status Achieved     PT SHORT TERM GOAL #3   Title Patient will report a 25% improvement in sleep   Status Achieved           PT Long Term Goals - 03/29/17 1558      PT LONG TERM GOAL #1   Title Independent with HEP   Time 8   Period Weeks   Status On-going     PT LONG TERM GOAL #2   Title walk for longer for 10-15 minutes with left hip pain decreased >/= 50%   Status Partially Met     PT LONG TERM GOAL #3   Title The patient will have improved hip abduction and extension strength to 4/5 needed to rise from a chair with greater ease   Status Achieved     PT LONG TERM GOAL #4   Title The patient will report a 60% improvement in sleep   Status Achieved     PT LONG TERM GOAL #5   Title FOTO functional outcome score improved from 52% to 37% indicating improved function with less pain   Status Not Met               Plan - 03/29/17 1605    Clinical Impression Statement I feel like PT has been  helpful but patient states overall she is about 60% better.  I am peaved though b/c I wish I was better.    I did water aerobics and went to lunch with friends, no pain.  Some days my back and leg really get aggravated.  Good improvements in hip abduction strength, lumbar extension ROM and core muscle activation.   Partial progress toward goals.  Will hold PT as patient follows up with the MD.  Patient will call following to determine continuation of PT vs. discharge.     PT Next Visit Plan Reassessment next visit-pt going to MD next week.  No more appointments scheduled.  Patient to call in 1-2 weeks, will need re-cert if continues      Patient will benefit from skilled therapeutic intervention in order to improve the following deficits and impairments:     Visit Diagnosis: Muscle weakness (generalized)  Pain in left hip  Other abnormalities of gait and mobility  Chronic left-sided low back pain with left-sided sciatica    PHYSICAL THERAPY DISCHARGE SUMMARY  Visits from Start of Care: 12  Current functional level related to goals / functional outcomes: See above.  The patient has not called to resume PT after follow up with her doctor.  Her charge has been inactive 1 month.  Will discharge from PT at this time with partial goals met.     Remaining deficits: As above   Education / Equipment: Comprehensive HEP Plan: Patient agrees to discharge.  Patient goals were partially met. Patient is being discharged due to not returning since the last visit.  ?????         G code:  Mobility walking and moving around                 Goal CK 40-60%                  Discharge CK 40-60%   Problem List Patient Active Problem List   Diagnosis Date Noted  .  Varicose veins of lower extremities with other complications 48/18/5909   Ruben Im, PT 03/29/17 5:08 PM Phone: [phone] Fax: [phone]  [name] 03/29/2017, 5:08 PM  [facility] [address] [place], [place], [address] Phone: [phone]   Fax:  [phone]  Name: VERN PRESTIA MRN: [id number] Date of Birth: 27-Aug-1941

## 2017-04-17 ENCOUNTER — Other Ambulatory Visit: Payer: Self-pay | Admitting: Physician Assistant

## 2017-07-16 ENCOUNTER — Ambulatory Visit (INDEPENDENT_AMBULATORY_CARE_PROVIDER_SITE_OTHER): Payer: Medicare Other | Admitting: Podiatry

## 2017-07-16 ENCOUNTER — Encounter: Payer: Self-pay | Admitting: Podiatry

## 2017-07-16 DIAGNOSIS — L603 Nail dystrophy: Secondary | ICD-10-CM | POA: Diagnosis not present

## 2017-07-16 NOTE — Progress Notes (Signed)
   HPI: Patient presents today as a new patient for evaluation of a nail problem to the left great toe. Patient states that the nail is loosened rising up. Patient denies trauma or stubbing her toe. Patient states that this is been going on for the past couple of weeks. Patient presents today for further treatment and evaluation.   Physical Exam: General: The patient is alert and oriented x3 in no acute distress.  Dermatology: Hyperkeratotic dystrophic toenail noted to the left great toe with partial onycholysis. Skin is warm, dry and supple bilateral lower extremities. Negative for open lesions or macerations.  Vascular: Palpable pedal pulses bilaterally. No edema or erythema noted. Capillary refill within normal limits.  Neurological: Epicritic and protective threshold grossly intact bilaterally.   Musculoskeletal Exam: Range of motion within normal limits to all pedal and ankle joints bilateral. Muscle strength 5/5 in all groups bilateral.   Assessment: 1. Dystrophic toenails with onycholysis left great toe   Plan of Care:  1. Patient was evaluated. 2. Mechanical debridement of the nail was performed using a nail nipper without incident or bleeding. 3. Return to clinic when necessary   Edrick Kins, DPM [facility]  Dr. Edrick Kins, DPM    [address], [place] [address]                Office [phone]  Fax [phone]

## 2018-02-07 ENCOUNTER — Other Ambulatory Visit: Payer: Self-pay | Admitting: Acute Care

## 2018-02-07 DIAGNOSIS — F17210 Nicotine dependence, cigarettes, uncomplicated: Principal | ICD-10-CM

## 2018-02-07 DIAGNOSIS — Z122 Encounter for screening for malignant neoplasm of respiratory organs: Secondary | ICD-10-CM

## 2018-02-15 ENCOUNTER — Ambulatory Visit (INDEPENDENT_AMBULATORY_CARE_PROVIDER_SITE_OTHER)
Admission: RE | Admit: 2018-02-15 | Discharge: 2018-02-15 | Disposition: A | Discharge status: Home / Self Care | Payer: Medicare Other | Source: Ambulatory Visit | Attending: Acute Care | Admitting: Acute Care

## 2018-02-15 ENCOUNTER — Encounter: Payer: Self-pay | Admitting: Acute Care

## 2018-02-15 ENCOUNTER — Ambulatory Visit (INDEPENDENT_AMBULATORY_CARE_PROVIDER_SITE_OTHER): Payer: Medicare Other | Admitting: Acute Care

## 2018-02-15 DIAGNOSIS — F17210 Nicotine dependence, cigarettes, uncomplicated: Secondary | ICD-10-CM

## 2018-02-15 DIAGNOSIS — Z122 Encounter for screening for malignant neoplasm of respiratory organs: Secondary | ICD-10-CM

## 2018-02-15 NOTE — Progress Notes (Signed)
Shared Decision Making Visit Lung Cancer Screening Program [phone])   Eligibility:  Age 77 y.o.  Pack Years Smoking History Calculation 60 pack year smoking history (# packs/per year x # years smoked)  Recent History of coughing up blood  no  Unexplained weight loss? no ( >Than 15 pounds within the last 6 months )  Prior History Lung / other cancer no (Diagnosis within the last 5 years already requiring surveillance chest CT Scans).  Smoking Status Current Smoker  Former Smokers: Years since quit: NA  Quit Date: NA  Visit Components:  Discussion included one or more decision making aids. yes  Discussion included risk/benefits of screening. yes  Discussion included potential follow up diagnostic testing for abnormal scans. yes  Discussion included meaning and risk of over diagnosis. yes  Discussion included meaning and risk of False Positives. yes  Discussion included meaning of total radiation exposure. yes  Counseling Included:  Importance of adherence to annual lung cancer LDCT screening. yes  Impact of comorbidities on ability to participate in the program. yes  Ability and willingness to under diagnostic treatment. yes  Smoking Cessation Counseling:  Current Smokers:   Discussed importance of smoking cessation. yes  Information about tobacco cessation classes and interventions provided to patient. yes  Patient provided with "ticket" for LDCT Scan. yes  Symptomatic Patient. no  Counseling: na  Diagnosis Code: Tobacco Use Z72.0  Asymptomatic Patient yes  Counseling (Intermediate counseling: > three minutes counseling) D5329  Former Smokers:   Discussed the importance of maintaining cigarette abstinence. yes  Diagnosis Code: Personal History of Nicotine Dependence. J24.268  Information about tobacco cessation classes and interventions provided to patient. Yes  Patient provided with "ticket" for LDCT Scan. yes  Written Order for Lung Cancer  Screening with LDCT placed in Epic. Yes (CT Chest Lung Cancer Screening Low Dose W/O CM) [id number] Z12.2-Screening of respiratory organs Z87.891-Personal history of nicotine dependence  I have spent 25 minutes of face to face time with Karen Bonilla discussing the risks and benefits of lung cancer screening. We viewed a power point together that explained in detail the above noted topics. We paused at intervals to allow for questions to be asked and answered to ensure understanding.We discussed that the single most powerful action that She can take to decrease her risk of developing lung cancer is to quit smoking. We discussed whether or not she is ready to commit to setting a quit date. We discussed options for tools to aid in quitting smoking including nicotine replacement therapy, non-nicotine medications, support groups, Quit Smart classes, and behavior modification. We discussed that often times setting smaller, more achievable goals, such as eliminating 1 cigarette a day for a week and then 2 cigarettes a day for a week can be helpful in slowly decreasing the number of cigarettes smoked. This allows for a sense of accomplishment as well as providing a clinical benefit. I gave her the " Be Stronger Than Your Excuses" card with contact information for community resources, classes, free nicotine replacement therapy, and access to mobile apps, text messaging, and on-line smoking cessation help. I have also given her my card and contact information in the event she needs to contact me. We discussed the time and location of the scan, and that either Doroteo Glassman RN or I will call with the results within 24-48 hours of receiving them. I have offered her  a copy of the power point we viewed  as a resource in the event they need reinforcement  of the concepts we discussed today in the office. The patient verbalized understanding of all of  the above and had no further questions upon leaving the office. They have my  contact information in the event they have any further questions.  I spent 5 minutes counseling on smoking cessation and the health risks of continued tobacco abuse.  I explained to the patient that there has been a high incidence of coronary artery disease noted on these exams. I explained that this is a non-gated exam therefore degree or severity cannot be determined. This patient is Currently on  statin therapy. I have asked the patient to follow-up with their PCP regarding any incidental finding of coronary artery disease and management with diet or medication as their PCP  feels is clinically indicated. The patient verbalized understanding of the above and had no further questions upon completion of the visit.      Karen Spatz, NP 02/15/2018  4:25 PM

## 2018-02-20 ENCOUNTER — Other Ambulatory Visit: Payer: Self-pay | Admitting: Acute Care

## 2018-02-20 DIAGNOSIS — Z122 Encounter for screening for malignant neoplasm of respiratory organs: Secondary | ICD-10-CM

## 2018-02-20 DIAGNOSIS — F17210 Nicotine dependence, cigarettes, uncomplicated: Principal | ICD-10-CM

## 2018-03-08 ENCOUNTER — Telehealth: Payer: Self-pay

## 2018-03-08 NOTE — Telephone Encounter (Signed)
SENT REFERRAL TO [place] Benjamine Mola [facility] [phone]

## 2018-03-19 ENCOUNTER — Encounter: Payer: Self-pay | Admitting: Interventional Cardiology

## 2018-03-19 ENCOUNTER — Ambulatory Visit: Payer: Medicare Other | Admitting: Interventional Cardiology

## 2018-03-19 VITALS — BP 142/78 | HR 71 | Ht 67.0 in | Wt 156.6 lb

## 2018-03-19 DIAGNOSIS — I2510 Atherosclerotic heart disease of native coronary artery without angina pectoris: Secondary | ICD-10-CM

## 2018-03-19 DIAGNOSIS — R030 Elevated blood-pressure reading, without diagnosis of hypertension: Secondary | ICD-10-CM

## 2018-03-19 DIAGNOSIS — Z720 Tobacco use: Secondary | ICD-10-CM

## 2018-03-19 DIAGNOSIS — E782 Mixed hyperlipidemia: Secondary | ICD-10-CM | POA: Diagnosis not present

## 2018-03-19 DIAGNOSIS — I2584 Coronary atherosclerosis due to calcified coronary lesion: Secondary | ICD-10-CM

## 2018-03-19 NOTE — Patient Instructions (Signed)
Medication Instructions:  Your physician recommends that you continue on your current medications as directed. Please refer to the Current Medication list given to you today.   Labwork: None ordered  Testing/Procedures: None ordered  Follow-Up: Your physician wants you to follow-up as needed with Dr. Irish Lack.    Any Other Special Instructions Will Be Listed Below (If Applicable).     If you need a refill on your cardiac medications before your next appointment, please call your pharmacy.

## 2018-03-19 NOTE — Progress Notes (Signed)
Cardiology Office Note   Date:  03/19/2018   ID:  Karen, Bonilla 05-May-1941, MRN [id number]  PCP:  Leighton Ruff, MD    No chief complaint on file.  Coronary artery calcifications by CT  Wt Readings from Last 3 Encounters:  03/19/18 156 lb 9.6 oz (71 kg)  02/03/13 162 lb (73.5 kg)  11/04/12 172 lb (78 kg)       History of Present Illness: Karen Bonilla is a 77 y.o. female with a history of tobacco abuse.  She underwent screening CT scan for lung cancer.  This revealed atherosclerotic calcifications in the coronary arteries and aortic valve.  February 2019 CT scan findings as follows:  Cardiovascular: Atherosclerotic calcification of the arterial vasculature, including coronary arteries and aortic valve. Heart size normal. No pericardial effusion.  No lung CA.  Emphysema noted.   I saw Karen Bonilla many years ago.  She underwent stress testing in January 2011.  At that time, there is no evidence of ischemia.  Ejection fraction was 75% by nuclear stress test.  Exercise capacity was 10 METs.  Between 2011 and 2019, she lost 20 lbs.  She is also had carotid artery testing done showing minimal carotid artery disease bilaterally in 2010.  In 2010 she also had abdominal aorta screening due to her tobacco history.  There is no evidence of abdominal aortic aneurysm.  She continues to smoke and is not trying to quit.  She does water aerobics 4x/week.  No problems with that activity.  She does her own yardwork as well.  Activity is limited by back.    Past Medical History:  Diagnosis Date  . Anemia   . Colon polyps   . COPD (chronic obstructive pulmonary disease) ([place])   . Hyperlipidemia   . Mucoid cyst of joint   . Spinal stenosis   . Varicose veins of lower extremities with other complications 04/16/5360    Past Surgical History:  Procedure Laterality Date  . ABDOMINAL HYSTERECTOMY    . MASS EXCISION  01/11/2012   Procedure: MINOR EXCISION OF MASS;  Surgeon: Cammie Sickle., MD;  Location: [place];  Service: Orthopedics;  Laterality: Right;  excision mucoid cyst right index and dip joint debridement     Current Outpatient Medications  Medication Sig Dispense Refill  . albuterol (PROVENTIL HFA;VENTOLIN HFA) 108 (90 BASE) MCG/ACT inhaler Inhale into the lungs every 6 (six) hours as needed for wheezing or shortness of breath.    . Cholecalciferol (VITAMIN D) 2000 UNITS tablet Take 2,000 Units by mouth daily.    Marland Kitchen ibuprofen (ADVIL,MOTRIN) 200 MG tablet Take 1 tablet (200 mg total) by mouth every 4 (four) hours as needed for pain. 30 tablet 0  . pravastatin (PRAVACHOL) 20 MG tablet Take 20 mg by mouth daily.     No current facility-administered medications for this visit.     Allergies:   Patient has no known allergies.    Social History:  The patient  reports that she has been smoking cigarettes.  She started smoking about 61 years ago. She has a 60.00 pack-year smoking history. She has never used smokeless tobacco. She reports that she drinks about 3.0 oz of alcohol per week. She reports that she does not use drugs.   Family History:  The patient's family history includes Hypertension in her father and mother; Other in her father.    ROS:  Please see the history of present illness.   Otherwise,  review of systems are positive for back pain.   All other systems are reviewed and negative.    PHYSICAL EXAM: VS:  BP (!) 142/78 (BP Location: Right Arm, Patient Position: Sitting, Cuff Size: Normal)   Pulse 71   Ht 5\' 7"  (1.702 m)   Wt 156 lb 9.6 oz (71 kg)   SpO2 98%   BMI 24.53 kg/m  , BMI Body mass index is 24.53 kg/m. GEN: Well nourished, well developed, in no acute distress  HEENT: normal  Neck: no JVD, carotid bruits, or masses Cardiac: \RRR; no murmurs, rubs, or gallops,no edema  Respiratory:  clear to auscultation bilaterally, normal work of breathing GI: soft, nontender, nondistended, + BS MS: no deformity or atrophy    Skin: warm and dry, no rash Neuro:  Strength and sensation are intact Psych: euthymic mood, full affect   EKG:   The ekg ordered today demonstrates NSR, septal Q waves, no ST changes   Recent Labs: No results found for requested labs within last 8760 hours.   Lipid Panel No results found for: CHOL, TRIG, HDL, CHOLHDL, VLDL, LDLCALC, LDLDIRECT   Other studies Reviewed: Additional studies/ records that were reviewed today with results demonstrating: prior stress test reviewed as above.   ASSESSMENT AND PLAN:  1. Coronary artery calcification by CT: No anginal symptoms.  Continue aggressive preventive therapy.  No symptoms to suggest that her coronary artery calcium is causing ischemia.  We discussed further testing with stress test.  Given her lack of symptoms, we decided to hold off.  She is in agreement with this plan.  She will let us know if she has any symptoms that limit her exertion.  We would reconsider ischemic testing at that time. 2. Hyperlipidemia: LDL 71 in 2017.  These labs are to be rechecked with Dr. Drema Dallas in the near future.  Continue pravastatin. 3. Elevated blood pressure reading: Normally, her blood pressure is much better control.  It was 126/70 in February 2019.  Continue to monitor.  She does not have hypertension at this point. 4. Tobacco abuse: She needs to stop smoking.   Current medicines are reviewed at length with the patient today.  The patient concerns regarding her medicines were addressed.  The following changes have been made:  No change  Labs/ tests ordered today include:  No orders of the defined types were placed in this encounter.   Recommend 150 minutes/week of aerobic exercise Low fat, low carb, high fiber diet recommended  Disposition:   FU as needed   Signed, Larae Grooms, MD  03/19/2018 9:00 AM    [address], [place], [place]  [address] Phone: [phone]; Fax: [phone]

## 2018-04-22 ENCOUNTER — Ambulatory Visit: Payer: Medicare Other | Attending: Family Medicine | Admitting: Physical Therapy

## 2018-04-22 ENCOUNTER — Encounter: Payer: Self-pay | Admitting: Physical Therapy

## 2018-04-22 DIAGNOSIS — M5441 Lumbago with sciatica, right side: Secondary | ICD-10-CM | POA: Diagnosis present

## 2018-04-22 DIAGNOSIS — G8929 Other chronic pain: Secondary | ICD-10-CM | POA: Diagnosis present

## 2018-04-22 DIAGNOSIS — M6281 Muscle weakness (generalized): Secondary | ICD-10-CM | POA: Diagnosis present

## 2018-04-22 NOTE — Patient Instructions (Signed)
Access Code: [email or web address]  URL: [email or web address]  Date: 04/22/2018  Prepared by: Faustino Congress   Exercises  Supine Piriformis Stretch with Foot on Ground - 3 reps - 1 sets - 30 sec hold - 2x daily - 7x weekly  Hooklying Single Knee to Chest - 3 reps - 1 sets - 30 sec hold - 2x daily - 7x weekly  Supine Posterior Pelvic Tilt - 10 reps - 1 sets - 5 sec hold - 2x daily - 7x weekly  Supine Bridge - 10 reps - 1 sets - 5 sec hold - 2x daily - 7x weekly

## 2018-04-22 NOTE — Therapy (Signed)
[facility] [address] [place], [place], [address] Phone: [phone]   Fax:  [phone]  Physical Therapy Evaluation  Patient Details  Name: Karen Bonilla MRN: [id number] Date of Birth: 24-Aug-1941 Referring Provider: Leighton Ruff, MD   Encounter Date: 04/22/2018  PT End of Session - 04/22/18 1406    Visit Number  1    Number of Visits  12    Date for PT Re-Evaluation  06/03/18    Authorization Type  UHC Medicare    PT Start Time  1230    PT Stop Time  1313    PT Time Calculation (min)  43 min    Activity Tolerance  Patient tolerated treatment well    Behavior During Therapy  [facility] for tasks assessed/performed       Past Medical History:  Diagnosis Date  . Anemia   . Colon polyps   . COPD (chronic obstructive pulmonary disease) ([place])   . Hyperlipidemia   . Mucoid cyst of joint   . Spinal stenosis   . Varicose veins of lower extremities with other complications 2/99/3716    Past Surgical History:  Procedure Laterality Date  . ABDOMINAL HYSTERECTOMY    . MASS EXCISION  01/11/2012   Procedure: MINOR EXCISION OF MASS;  Surgeon: Cammie Sickle., MD;  Location: [place];  Service: Orthopedics;  Laterality: Right;  excision mucoid cyst right index and dip joint debridement    There were no vitals filed for this visit.   Subjective Assessment - 04/22/18 1232    Subjective  Pt is a 77 y/o female who presents to OPPT for LBP with c/o sciatica into RLE.  Pt reports difficulty getting comfortable at night and walking is painful.  Pt states pain is chronic and has been to PT at this clinic for similar dx but aggravated symptoms x 2-3 months.      Limitations  Walking;Standing;House hold activities    How long can you stand comfortably?  30 min    How long can you walk comfortably?  doesn't actively walk; reports working in yard for 4 hours yesterday but had to take breaks    Patient Stated  Goals  improve pain, get to sleep easier    Currently in Pain?  Yes    Pain Score  4  at best 4/10; at worst 5/10    Pain Location  Buttocks    Pain Orientation  Lower;Right    Pain Descriptors / Indicators  Aching;Sharp    Pain Type  Chronic pain;Neuropathic pain    Pain Radiating Towards  RLE sometimes to ankle    Pain Onset  More than a month ago    Pain Frequency  Constant    Aggravating Factors   standing, walking, difficulty getting into comfortable sleeping position, stairs    Pain Relieving Factors  tylenol, ibuprofen         [facility] PT Assessment - 04/22/18 1239      Assessment   Medical Diagnosis  M48.00 (ICD-10-CM) - Spinal stenosis, site unspecified    Referring Provider  Leighton Ruff, MD    Onset Date/Surgical Date  -- chronic; exacerbation x 3 months    Next MD Visit  PRN    Prior Therapy  at this clinic 1 year ago      Precautions   Precautions  None      Restrictions   Weight Bearing Restrictions  No  Balance Screen   Has the patient fallen in the past 6 months  No    Has the patient had a decrease in activity level because of a fear of falling?   No    Is the patient reluctant to leave their home because of a fear of falling?   No      Home Environment   Living Environment  Private residence    Living Arrangements  Alone    Type of [place] to enter    Entrance Stairs-Number of Steps  6    Entrance Stairs-Rails  Left    [place]  One level    [place]  None      Prior Function   Level of Independence  Independent    Vocation  Retired    Leisure  water aerobics 4x/wk; Haematologist      Cognition   Overall Cognitive Status  Within Functional Limits for tasks assessed      Observation/Other Assessments   Focus on Therapeutic Outcomes (FOTO)   46 (54% limited; predicted 40% limited)      Posture/Postural Control   Posture/Postural Control  Postural limitations    Postural Limitations  Decreased lumbar  lordosis;Rounded Shoulders;Forward head;Right pelvic obliquity    Posture Comments  S curve      ROM / Strength   AROM / PROM / Strength  AROM;Strength      AROM   AROM Assessment Site  Lumbar    Lumbar Flexion  86    Lumbar Extension  13    Lumbar - Right Side Bend  16    Lumbar - Left Side Bend  15      Strength   Strength Assessment Site  Hip;Knee;Ankle    Right/Left Hip  Right;Left    Right Hip Flexion  4/5    Right Hip Extension  3-/5    Right Hip ABduction  3/5    Left Hip Flexion  4/5    Left Hip Extension  3/5    Left Hip ABduction  3+/5    Right/Left Knee  Right;Left    Right Knee Flexion  3/5    Right Knee Extension  5/5    Left Knee Flexion  3/5    Left Knee Extension  5/5    Right/Left Ankle  Right;Left    Right Ankle Dorsiflexion  5/5    Left Ankle Dorsiflexion  5/5      Flexibility   Soft Tissue Assessment /Muscle Length  yes    Hamstrings  WNL    Piriformis  tightness bil      Palpation   Palpation comment  trigger points in Rt glutes and suspect piriformis as well      Special Tests    Special Tests  Lumbar;Leg LengthTest    Leg length test   True      True   Length  cm    Right  91.5 in.    Left   93 in.                Objective measurements completed on examination: See above findings.      [place] Adult PT Treatment/Exercise - 04/22/18 1239      Self-Care   Self-Care  Other Self-Care Comments    Other Self-Care Comments   educated on use of tennis ball for STM, performed 1-3 reps of HEP including piriformis stretch, knee to chest, pelvic tilts  and bridging             PT Education - 04/22/18 1406    Education provided  Yes    Education Details  HEP    Person(s) Educated  Patient    Methods  Explanation;Demonstration;Handout    Comprehension  Verbalized understanding;Returned demonstration;Need further instruction          PT Long Term Goals - 04/22/18 1411      PT LONG TERM GOAL #1   Title  Independent with  HEP    Status  New    Target Date  06/03/18      PT LONG TERM GOAL #2   Title  report 50% improvement in going to sleep for improved function    Status  New    Target Date  06/03/18      PT LONG TERM GOAL #3   Title  improve RLE strength to at least 4/5 for improved function     Status  New    Target Date  06/03/18      PT LONG TERM GOAL #4   Title  FOTO improved to </= 40% limited for improved function and decreased pain    Status  New    Target Date  06/03/18      PT LONG TERM GOAL #5   Title  report 50% improvement in pain and symptoms for improved function    Status  New    Target Date  06/03/18             Plan - 04/22/18 1407    Clinical Impression Statement  Pt is a 77 y/o female who returns to OPPT for LBP with Rt sided sciatica.  Pt demonstrates postural abnormalities, decreased strength, active trigger points and decreased flexibility affecting functional mobility.  Pt will benefit from PT to address deficits listed.  Pt known to this clinic and seen last year for similar conditions.  Will plan to review and update HEP PRN.    History and Personal Factors relevant to plan of care:  spinal stenosis    Clinical Presentation  Stable    Clinical Decision Making  Low    Rehab Potential  Good    PT Frequency  2x / week    PT Duration  6 weeks    PT Treatment/Interventions  ADLs/Self Care Home Management;Cryotherapy;Electrical Stimulation;Moist Heat;Traction;Therapeutic exercise;Therapeutic activities;Functional mobility training;Stair training;Gait training;Ultrasound;Neuromuscular re-education;Patient/family education;Manual techniques;Taping;Dry needling    PT Next Visit Plan  review HEP, DN to rt glutes and piriformis, look at old HEP and update/review PRN, manual/modalities PRN    PT Home Exercise Plan  Access Code: [email or web address]    Consulted and Agree with Plan of Care  Patient       Patient will benefit from skilled therapeutic intervention in order to improve the  following deficits and impairments:  Increased fascial restricitons, Increased muscle spasms, Pain, Decreased strength, Difficulty walking, Decreased mobility, Decreased activity tolerance, Impaired flexibility, Decreased range of motion, Postural dysfunction  Visit Diagnosis: Chronic right-sided low back pain with right-sided sciatica - Plan: PT plan of care cert/re-cert  Muscle weakness (generalized) - Plan: PT plan of care cert/re-cert     Problem List Patient Active Problem List   Diagnosis Date Noted  . Coronary artery calcification 03/19/2018  . Varicose veins of lower extremities with other complications 82/99/3716      [name], PT, DPT 04/22/18 2:15 PM    [facility] [address],  [place], [place], [address] Phone: [phone]   Fax:  [phone]  Name: ALMEDA EZRA MRN: [id number] Date of Birth: March 14, 1941

## 2018-04-24 ENCOUNTER — Encounter: Payer: Self-pay | Admitting: Physical Therapy

## 2018-04-24 ENCOUNTER — Ambulatory Visit: Payer: Medicare Other | Attending: Family Medicine | Admitting: Physical Therapy

## 2018-04-24 DIAGNOSIS — M5442 Lumbago with sciatica, left side: Secondary | ICD-10-CM | POA: Diagnosis present

## 2018-04-24 DIAGNOSIS — M25552 Pain in left hip: Secondary | ICD-10-CM | POA: Diagnosis present

## 2018-04-24 DIAGNOSIS — M6281 Muscle weakness (generalized): Secondary | ICD-10-CM

## 2018-04-24 DIAGNOSIS — M5441 Lumbago with sciatica, right side: Secondary | ICD-10-CM | POA: Insufficient documentation

## 2018-04-24 DIAGNOSIS — G8929 Other chronic pain: Secondary | ICD-10-CM | POA: Diagnosis present

## 2018-04-24 DIAGNOSIS — R2689 Other abnormalities of gait and mobility: Secondary | ICD-10-CM | POA: Diagnosis present

## 2018-04-24 NOTE — Patient Instructions (Signed)
Side Stretch    With feet shoulder width apart, hands on hips or counter top, inhale. Then exhale while bending directly to left side, keeping top arm outstretched. Hold 3 breaths count. Slowly return to starting position. Repeat _1-3___ times, each side. You can do this in the water too! Sprinkle this exercise/stretch throughout your day.     #2: Standing next to your counter top or something solid, place your hands on top and walk your feet back. Place your head between your arms and shift your weight into your heels. You should make a 90 degree angle shape. Hold 3 breaths. You may also walk your hands to the left to stretch along the right side even more. Do 3x day.   Copyright  VHI. All rights reserved.

## 2018-04-24 NOTE — Therapy (Signed)
[facility] [address] [place], [place], [address] Phone: [phone]   Fax:  [phone]  Physical Therapy Treatment  Patient Details  Name: Karen Bonilla MRN: [id number] Date of Birth: 1941-05-20 Referring Provider: Leighton Ruff, MD   Encounter Date: 04/24/2018  PT End of Session - 04/24/18 1236    Visit Number  2    Number of Visits  12    Date for PT Re-Evaluation  06/03/18    Authorization Type  UHC Medicare    PT Start Time  1234    PT Stop Time  1310    PT Time Calculation (min)  36 min    Activity Tolerance  Patient tolerated treatment well    Behavior During Therapy  [facility] for tasks assessed/performed       Past Medical History:  Diagnosis Date  . Anemia   . Colon polyps   . COPD (chronic obstructive pulmonary disease) ([place])   . Hyperlipidemia   . Mucoid cyst of joint   . Spinal stenosis   . Varicose veins of lower extremities with other complications 6/96/2952    Past Surgical History:  Procedure Laterality Date  . ABDOMINAL HYSTERECTOMY    . MASS EXCISION  01/11/2012   Procedure: MINOR EXCISION OF MASS;  Surgeon: Cammie Sickle., MD;  Location: [facility];  Service: Orthopedics;  Laterality: Right;  excision mucoid cyst right index and dip joint debridement    There were no vitals filed for this visit.  Subjective Assessment - 04/24/18 1238    Subjective  I did my water aerobics class this AM and did well. Not much pain today.     Limitations  Walking;Standing;House hold activities    How long can you stand comfortably?  30 min    How long can you walk comfortably?  doesn't actively walk; reports working in yard for 4 hours yesterday but had to take breaks    Patient Stated Goals  improve pain, get to sleep easier    Currently in Pain?  Yes    Pain Score  2     Pain Location  Back    Pain Orientation  Right;Lower    Pain Descriptors / Indicators  Dull    Multiple  Pain Sites  No                       [facility] Adult PT Treatment/Exercise - 04/24/18 0001      Lumbar Exercises: Stretches   Single Knee to Chest Stretch  Right;3 reps;10 seconds    Pelvic Tilt  10 reps    Piriformis Stretch  Right;3 reps;20 seconds      Lumbar Exercises: Aerobic   Nustep  L1 x 6 min PTA present for status update      Lumbar Exercises: Standing   Row  Strengthening;Both;20 reps;Theraband    Theraband Level (Row)  Level 2 (Red)    Row Limitations  VC/TC for postural correction, core activation    Shoulder Extension  Strengthening;Both;20 reps;Theraband Alternating, VC/TC for posture/core activation    Theraband Level (Shoulder Extension)  Level 2 (Red)      Lumbar Exercises: Supine   Bridge  10 reps             PT Education - 04/24/18 1253    Education provided  Yes    Education Details  HEP progression to include right side side bend stretch and standing 90 degree  angle stretch at counter top    Person(s) Educated  Patient    Methods  Explanation;Demonstration;Verbal cues;Handout;Tactile cues    Comprehension  Returned demonstration;Verbalized understanding          PT Long Term Goals - 04/22/18 1411      PT LONG TERM GOAL #1   Title  Independent with HEP    Status  New    Target Date  06/03/18      PT LONG TERM GOAL #2   Title  report 50% improvement in going to sleep for improved function    Status  New    Target Date  06/03/18      PT LONG TERM GOAL #3   Title  improve RLE strength to at least 4/5 for improved function     Status  New    Target Date  06/03/18      PT LONG TERM GOAL #4   Title  FOTO improved to </= 40% limited for improved function and decreased pain    Status  New    Target Date  06/03/18      PT LONG TERM GOAL #5   Title  report 50% improvement in pain and symptoms for improved function    Status  New    Target Date  06/03/18            Plan - 04/24/18 1237    Clinical Impression Statement   Pt presents today with little pain and already completing her water aerobics class this AM. She is compliant and independent in her initial HEP as evidence of her performance today.  Pt required verbal cuing for her posture when standing as she tended to pitch forward at her trunk.  She is aware of her postural deviations , she reports it is pretty exhausting thinking about it all the time.  She is excited about dry needling next session.     Rehab Potential  Good    PT Frequency  2x / week    PT Duration  6 weeks    PT Treatment/Interventions  ADLs/Self Care Home Management;Cryotherapy;Electrical Stimulation;Moist Heat;Traction;Therapeutic exercise;Therapeutic activities;Functional mobility training;Stair training;Gait training;Ultrasound;Neuromuscular re-education;Patient/family education;Manual techniques;Taping;Dry needling    PT Next Visit Plan  DN next to RT glutes and piriformis, progress HEP if needed    PT Home Exercise Plan  Access Code: [email or web address]    Consulted and Agree with Plan of Care  Patient       Patient will benefit from skilled therapeutic intervention in order to improve the following deficits and impairments:  Increased fascial restricitons, Increased muscle spasms, Pain, Decreased strength, Difficulty walking, Decreased mobility, Decreased activity tolerance, Impaired flexibility, Decreased range of motion, Postural dysfunction  Visit Diagnosis: Chronic right-sided low back pain with right-sided sciatica  Muscle weakness (generalized)  Pain in left hip  Other abnormalities of gait and mobility  Chronic left-sided low back pain with left-sided sciatica     Problem List Patient Active Problem List   Diagnosis Date Noted  . Coronary artery calcification 03/19/2018  . Varicose veins of lower extremities with other complications 21/19/4174    Alorah Mcree, PTA 04/24/2018, 2:30 PM  [facility] [address] [place], [place], [address] Phone: [phone]   Fax:  [phone]  Name: Karen Bonilla MRN: [id number] Date of Birth: [birth year]

## 2018-04-29 ENCOUNTER — Ambulatory Visit: Payer: Medicare Other | Admitting: Physical Therapy

## 2018-04-29 ENCOUNTER — Encounter: Payer: Self-pay | Admitting: Physical Therapy

## 2018-04-29 DIAGNOSIS — G8929 Other chronic pain: Secondary | ICD-10-CM

## 2018-04-29 DIAGNOSIS — M5441 Lumbago with sciatica, right side: Secondary | ICD-10-CM | POA: Diagnosis not present

## 2018-04-29 DIAGNOSIS — M6281 Muscle weakness (generalized): Secondary | ICD-10-CM

## 2018-04-29 NOTE — Therapy (Signed)
[facility] [address] [place], [place], [address] Phone: [phone]   Fax:  [phone]  Physical Therapy Treatment  Patient Details  Name: Karen Bonilla MRN: [id number] Date of Birth: [birth year] Referring Provider: Leighton Ruff, MD   Encounter Date: 04/29/2018  PT End of Session - 04/29/18 1354    Visit Number  3    Number of Visits  12    Date for PT Re-Evaluation  06/03/18    Authorization Type  UHC Medicare    PT Start Time  1230    PT Stop Time  1310    PT Time Calculation (min)  40 min    Activity Tolerance  Patient tolerated treatment well    Behavior During Therapy  [facility] for tasks assessed/performed       Past Medical History:  Diagnosis Date  . Anemia   . Colon polyps   . COPD (chronic obstructive pulmonary disease) ([place])   . Hyperlipidemia   . Mucoid cyst of joint   . Spinal stenosis   . Varicose veins of lower extremities with other complications 1/57/2620    Past Surgical History:  Procedure Laterality Date  . ABDOMINAL HYSTERECTOMY    . MASS EXCISION  01/11/2012   Procedure: MINOR EXCISION OF MASS;  Surgeon: Cammie Sickle., MD;  Location: [facility];  Service: Orthopedics;  Laterality: Right;  excision mucoid cyst right index and dip joint debridement    There were no vitals filed for this visit.  Subjective Assessment - 04/29/18 1234    Subjective  doing well, no pain today.  did yardwork Fri/Sat afternoon and reports no difficulty with back, but had pain Sunday when working inside (resolved with 2 ibuprofens)    Limitations  Walking;Standing;House hold activities    How long can you stand comfortably?  30 min    How long can you walk comfortably?  doesn't actively walk; reports working in yard for 4 hours yesterday but had to take breaks    Patient Stated Goals  improve pain, get to sleep easier    Currently in Pain?  No/denies                        [facility] Adult PT Treatment/Exercise - 04/29/18 1234      Lumbar Exercises: Stretches   Single Knee to Chest Stretch  Right;2 reps;30 seconds    Piriformis Stretch  Right;2 reps;30 seconds      Lumbar Exercises: Aerobic   Nustep  L3 x 6 min      Manual Therapy   Manual Therapy  Soft tissue mobilization    Manual therapy comments  skilled palpation and monitoring of soft tissue during DN    Soft tissue mobilization  IASTM to Rt glutes/piriformis       Trigger Point Dry Needling - 04/29/18 1356    Consent Given?  Yes    Education Handout Provided  Yes    Muscles Treated Lower Body  Gluteus minimus;Gluteus maximus;Piriformis    Gluteus Maximus Response  Twitch response elicited;Palpable increased muscle length glute med    Gluteus Minimus Response  Twitch response elicited;Palpable increased muscle length    Piriformis Response  Twitch response elicited;Palpable increased muscle length           PT Education - 04/29/18 1354    Education provided  Yes    Education Details  DN    Person(s) Educated  Patient  Methods  Explanation;Handout    Comprehension  Verbalized understanding          PT Long Term Goals - 04/22/18 1411      PT LONG TERM GOAL #1   Title  Independent with HEP    Status  New    Target Date  06/03/18      PT LONG TERM GOAL #2   Title  report 50% improvement in going to sleep for improved function    Status  New    Target Date  06/03/18      PT LONG TERM GOAL #3   Title  improve RLE strength to at least 4/5 for improved function     Status  New    Target Date  06/03/18      PT LONG TERM GOAL #4   Title  FOTO improved to </= 40% limited for improved function and decreased pain    Status  New    Target Date  06/03/18      PT LONG TERM GOAL #5   Title  report 50% improvement in pain and symptoms for improved function    Status  New    Target Date  06/03/18            Plan - 04/29/18 1354     Clinical Impression Statement  Pt tolerated DN well today with good twitch responses in all muscle groups and pt reporting decreased pain follwoing.  Reports compliance with HEP and able to recall new exercises without difficulty.  Will continue to benefit from PT to maximize function.    Rehab Potential  Good    PT Frequency  2x / week    PT Duration  6 weeks    PT Treatment/Interventions  ADLs/Self Care Home Management;Cryotherapy;Electrical Stimulation;Moist Heat;Traction;Therapeutic exercise;Therapeutic activities;Functional mobility training;Stair training;Gait training;Ultrasound;Neuromuscular re-education;Patient/family education;Manual techniques;Taping;Dry needling    PT Next Visit Plan  assess response to DN next to RT glutes and piriformis, progress HEP if needed    PT Home Exercise Plan  Access Code: [email or web address]    Consulted and Agree with Plan of Care  Patient       Patient will benefit from skilled therapeutic intervention in order to improve the following deficits and impairments:  Increased fascial restricitons, Increased muscle spasms, Pain, Decreased strength, Difficulty walking, Decreased mobility, Decreased activity tolerance, Impaired flexibility, Decreased range of motion, Postural dysfunction  Visit Diagnosis: Chronic right-sided low back pain with right-sided sciatica  Muscle weakness (generalized)     Problem List Patient Active Problem List   Diagnosis Date Noted  . Coronary artery calcification 03/19/2018  . 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 [place], [place], [address] Phone: [phone]   Fax:  [phone]  Name: Karen Bonilla MRN: [id number] Date of Birth: 12-Jan-1941

## 2018-04-29 NOTE — Patient Instructions (Signed)

## 2018-05-01 ENCOUNTER — Ambulatory Visit: Payer: Medicare Other | Admitting: Physical Therapy

## 2018-05-01 ENCOUNTER — Encounter: Payer: Self-pay | Admitting: Physical Therapy

## 2018-05-01 DIAGNOSIS — M5441 Lumbago with sciatica, right side: Principal | ICD-10-CM

## 2018-05-01 DIAGNOSIS — M6281 Muscle weakness (generalized): Secondary | ICD-10-CM

## 2018-05-01 DIAGNOSIS — G8929 Other chronic pain: Secondary | ICD-10-CM

## 2018-05-01 DIAGNOSIS — R2689 Other abnormalities of gait and mobility: Secondary | ICD-10-CM

## 2018-05-01 DIAGNOSIS — M5442 Lumbago with sciatica, left side: Secondary | ICD-10-CM

## 2018-05-01 DIAGNOSIS — M25552 Pain in left hip: Secondary | ICD-10-CM

## 2018-05-01 NOTE — Patient Instructions (Signed)
External Rotation: Hip - Knees Apart With Pelvic Floor (Side-Lying)    Lie on right side with hips and knees slightly bent. No band for you yet. Just work Museum/gallery conservator . Feet together then lift/rotate the LT thigh up off the the bottom leg.  Hold for __1_ seconds. Repeat __10_ times. Do _2__ times a day.   Copyright  VHI. All rights reserved.

## 2018-05-01 NOTE — Therapy (Signed)
[facility] [address] [place], [place], [address] Phone: [phone]   Fax:  [phone]  Physical Therapy Treatment  Patient Details  Name: Karen Bonilla MRN: [id number] Date of Birth: Mar 21, 1941 Referring Provider: Leighton Ruff, MD   Encounter Date: 05/01/2018  PT End of Session - 05/01/18 1151    Visit Number  4    Number of Visits  12    Date for PT Re-Evaluation  06/03/18    Authorization Type  UHC Medicare    PT Start Time  1151    PT Stop Time  1233    PT Time Calculation (min)  42 min    Activity Tolerance  Patient tolerated treatment well    Behavior During Therapy  [facility] for tasks assessed/performed       Past Medical History:  Diagnosis Date  . Anemia   . Colon polyps   . COPD (chronic obstructive pulmonary disease) ([place])   . Hyperlipidemia   . Mucoid cyst of joint   . Spinal stenosis   . Varicose veins of lower extremities with other complications 9/44/9675    Past Surgical History:  Procedure Laterality Date  . ABDOMINAL HYSTERECTOMY    . MASS EXCISION  01/11/2012   Procedure: MINOR EXCISION OF MASS;  Surgeon: Cammie Sickle., MD;  Location: [place];  Service: Orthopedics;  Laterality: Right;  excision mucoid cyst right index and dip joint debridement    There were no vitals filed for this visit.  Subjective Assessment - 05/01/18 1154    Subjective  I did great with the needling. My leg has not hurt since and I have done some yard work.     Limitations  Walking;Standing;House hold activities    Currently in Pain?  No/denies    Multiple Pain Sites  No                       [facility] Adult PT Treatment/Exercise - 05/01/18 0001      Lumbar Exercises: Stretches   Single Knee to Chest Stretch  Right;Left;3 reps;10 seconds    Pelvic Tilt  2 reps;10 reps;5 seconds VC to focus on her abdominals vs pushing with feet    Piriformis Stretch  Right;2 reps;30  seconds    Other Lumbar Stretch Exercise  Lt adductor stretch 2x 20 sec      Lumbar Exercises: Aerobic   Nustep  L3 x 7 min PTA present for status update.      Lumbar Exercises: Standing   Other Standing Lumbar Exercises  Lt hip abduction 10x 2 Pt does in the pool      Lumbar Exercises: Supine   Bridge  20 reps      Lumbar Exercises: Sidelying   Clam  Left;10 reps TC/VC for correct form             PT Education - 05/01/18 1217    Education provided  Yes    Education Details  HEP clamshell LT    Person(s) Educated  Patient    Methods  Explanation;Demonstration;Tactile cues;Verbal cues;Handout    Comprehension  Verbalized understanding;Returned demonstration          PT Long Term Goals - 04/22/18 1411      PT LONG TERM GOAL #1   Title  Independent with HEP    Status  New    Target Date  06/03/18      PT LONG TERM GOAL #2  Title  report 50% improvement in going to sleep for improved function    Status  New    Target Date  06/03/18      PT LONG TERM GOAL #3   Title  improve RLE strength to at least 4/5 for improved function     Status  New    Target Date  06/03/18      PT LONG TERM GOAL #4   Title  FOTO improved to </= 40% limited for improved function and decreased pain    Status  New    Target Date  06/03/18      PT LONG TERM GOAL #5   Title  report 50% improvement in pain and symptoms for improved function    Status  New    Target Date  06/03/18            Plan - 05/01/18 1151    Clinical Impression Statement  Pt felt great after dry needling session. She has had no leg pain since. Advanced HEP to include hip strength in addition to her pool exercises she already does. She reports she has a hard time "feeling" her left buttocks.     Rehab Potential  Good    PT Frequency  2x / week    PT Duration  6 weeks    PT Treatment/Interventions  ADLs/Self Care Home Management;Cryotherapy;Electrical Stimulation;Moist Heat;Traction;Therapeutic  exercise;Therapeutic activities;Functional mobility training;Stair training;Gait training;Ultrasound;Neuromuscular re-education;Patient/family education;Manual techniques;Taping;Dry needling    PT Next Visit Plan  DN next session and continue with glute strength.     PT Home Exercise Plan  Access Code: [email or web address]    Consulted and Agree with Plan of Care  --       Patient will benefit from skilled therapeutic intervention in order to improve the following deficits and impairments:  Increased fascial restricitons, Increased muscle spasms, Pain, Decreased strength, Difficulty walking, Decreased mobility, Decreased activity tolerance, Impaired flexibility, Decreased range of motion, Postural dysfunction  Visit Diagnosis: Chronic right-sided low back pain with right-sided sciatica  Muscle weakness (generalized)  Pain in left hip  Other abnormalities of gait and mobility  Chronic left-sided low back pain with left-sided sciatica     Problem List Patient Active Problem List   Diagnosis Date Noted  . Coronary artery calcification 03/19/2018  . 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, [place], [place], [address] Phone: [phone]   Fax:  [phone]  Name: Karen Bonilla MRN: [id number] Date of Birth: 05-04-1941

## 2018-05-07 ENCOUNTER — Ambulatory Visit: Payer: Medicare Other | Admitting: Physical Therapy

## 2018-05-07 ENCOUNTER — Encounter: Payer: Self-pay | Admitting: Physical Therapy

## 2018-05-07 DIAGNOSIS — M5441 Lumbago with sciatica, right side: Principal | ICD-10-CM

## 2018-05-07 DIAGNOSIS — G8929 Other chronic pain: Secondary | ICD-10-CM

## 2018-05-07 DIAGNOSIS — M6281 Muscle weakness (generalized): Secondary | ICD-10-CM

## 2018-05-07 NOTE — Therapy (Signed)
[facility] [address], [place], [place], [address] Phone: [phone]   Fax:  [phone]  Physical Therapy Treatment  Patient Details  Name: Karen Bonilla MRN: [id number] Date of Birth: 11-25-1941 Referring Provider: Leighton Ruff, MD   Encounter Date: 05/07/2018  PT End of Session - 05/07/18 1235    Visit Number  5    Number of Visits  12    Date for PT Re-Evaluation  06/03/18    Authorization Type  UHC Medicare    PT Start Time  1233    PT Stop Time  1313    PT Time Calculation (min)  40 min    Activity Tolerance  Patient tolerated treatment well    Behavior During Therapy  [facility] for tasks assessed/performed       Past Medical History:  Diagnosis Date  . Anemia   . Colon polyps   . COPD (chronic obstructive pulmonary disease) ([place])   . Hyperlipidemia   . Mucoid cyst of joint   . Spinal stenosis   . Varicose veins of lower extremities with other complications 8/54/6270    Past Surgical History:  Procedure Laterality Date  . ABDOMINAL HYSTERECTOMY    . MASS EXCISION  01/11/2012   Procedure: MINOR EXCISION OF MASS;  Surgeon: Cammie Sickle., MD;  Location: [place];  Service: Orthopedics;  Laterality: Right;  excision mucoid cyst right index and dip joint debridement    There were no vitals filed for this visit.  Subjective Assessment - 05/07/18 1237    Subjective  Sometimes it's hard to tell if it is my back or my leg.  I have just a little in my low back today but not right now. I did water aerobics today already    Limitations  Walking;Standing;House hold activities    How long can you stand comfortably?  30 min    How long can you walk comfortably?  doesn't actively walk; reports working in yard for 4 hours yesterday but had to take breaks    Patient Stated Goals  improve pain, get to sleep easier    Currently in Pain?  No/denies                       [facility]  Adult PT Treatment/Exercise - 05/07/18 0001      Lumbar Exercises: Stretches   Single Knee to Chest Stretch  Right;Left;3 reps;10 seconds    Piriformis Stretch  Right;30 seconds;Left;3 reps      Lumbar Exercises: Aerobic   Nustep  L1 x 5 min PT present for status update; warm up      Lumbar Exercises: Supine   Ab Set  20 reps knee drop out with TrA activation      Modalities   Modalities  Moist Heat      Moist Heat Therapy   Number Minutes Moist Heat  8 Minutes in supine during stretches      Manual Therapy   Manual therapy comments  skilled palpation and monitoring of soft tissue during DN    Soft tissue mobilization  trigger point release to rt glutes, piriformis and lumbar paraspinals        Trigger Point Dry Needling - 05/07/18 1239    Consent Given?  Yes    Muscles Treated Lower Body  Gluteus minimus;Gluteus maximus;Piriformis    Gluteus Maximus Response  Twitch response elicited;Palpable increased muscle length  PT Long Term Goals - 05/07/18 1236      PT LONG TERM GOAL #2   Title  report 50% improvement in going to sleep for improved function    Baseline  it is not hurting when going to sleep    Time  8    Period  Weeks    Status  Achieved      PT LONG TERM GOAL #4   Title  FOTO improved to </= 40% limited for improved function and decreased pain      PT LONG TERM GOAL #5   Title  report 50% improvement in pain and symptoms for improved function    Baseline  70% improved    Time  8    Period  Weeks    Status  Achieved            Plan - 05/07/18 1308    Clinical Impression Statement  Pt reports feeling 70% better since starting PT.  Pt experienced fewer twitches than initial session according to patient.  She did get a couple of twitches and palpable soft tissue release after dry needling and STM.  Pt will benefit from skilled PT to continue to address functional activities and goals.    PT Treatment/Interventions  ADLs/Self Care  Home Management;Cryotherapy;Electrical Stimulation;Moist Heat;Traction;Therapeutic exercise;Therapeutic activities;Functional mobility training;Stair training;Gait training;Ultrasound;Neuromuscular re-education;Patient/family education;Manual techniques;Taping;Dry needling    PT Next Visit Plan  continue with core and glute strength    Consulted and Agree with Plan of Care  Patient       Patient will benefit from skilled therapeutic intervention in order to improve the following deficits and impairments:  Increased fascial restricitons, Increased muscle spasms, Pain, Decreased strength, Difficulty walking, Decreased mobility, Decreased activity tolerance, Impaired flexibility, Decreased range of motion, Postural dysfunction  Visit Diagnosis: Chronic right-sided low back pain with right-sided sciatica  Muscle weakness (generalized)     Problem List Patient Active Problem List   Diagnosis Date Noted  . Coronary artery calcification 03/19/2018  . Varicose veins of lower extremities with other complications 94/17/4081    Zannie Cove, PT 05/07/2018, 1:16 PM   [facility] [address], [place], [place], [address] Phone: [phone]   Fax:  [phone]  Name: Karen Bonilla MRN: [id number] Date of Birth: 25-Nov-1941

## 2018-05-09 ENCOUNTER — Ambulatory Visit: Payer: Medicare Other | Admitting: Physical Therapy

## 2018-05-09 DIAGNOSIS — M5441 Lumbago with sciatica, right side: Secondary | ICD-10-CM | POA: Diagnosis not present

## 2018-05-09 DIAGNOSIS — G8929 Other chronic pain: Secondary | ICD-10-CM

## 2018-05-09 DIAGNOSIS — M6281 Muscle weakness (generalized): Secondary | ICD-10-CM

## 2018-05-09 DIAGNOSIS — M25552 Pain in left hip: Secondary | ICD-10-CM

## 2018-05-09 NOTE — Therapy (Signed)
[facility] [address] [place], [place], [address] Phone: [phone]   Fax:  [phone]  Physical Therapy Treatment  Patient Details  Name: Karen Bonilla MRN: [id number] Date of Birth: July 07, 1941 Referring Provider: Leighton Ruff, MD   Encounter Date: 05/09/2018  PT End of Session - 05/09/18 1514    Visit Number  6    Number of Visits  12    Date for PT Re-Evaluation  06/03/18    Authorization Type  UHC Medicare    PT Start Time  1449    PT Stop Time  1535    PT Time Calculation (min)  46 min    Activity Tolerance  Patient tolerated treatment well    Behavior During Therapy  [facility] for tasks assessed/performed       Past Medical History:  Diagnosis Date  . Anemia   . Colon polyps   . COPD (chronic obstructive pulmonary disease) ([place])   . Hyperlipidemia   . Mucoid cyst of joint   . Spinal stenosis   . Varicose veins of lower extremities with other complications 2/67/1245    Past Surgical History:  Procedure Laterality Date  . ABDOMINAL HYSTERECTOMY    . MASS EXCISION  01/11/2012   Procedure: MINOR EXCISION OF MASS;  Surgeon: Cammie Sickle., MD;  Location: [place];  Service: Orthopedics;  Laterality: Right;  excision mucoid cyst right index and dip joint debridement    There were no vitals filed for this visit.  Subjective Assessment - 05/09/18 1459    Subjective  I felt good after last session, maybe a little better but was already feeling good.  Today my bottom or back of the right leg is hurting    Limitations  Walking;Standing;House hold activities    Patient Stated Goals  improve pain, get to sleep easier    Currently in Pain?  Yes    Pain Score  4     Pain Location  Leg    Pain Orientation  Posterior;Right;Upper    Pain Descriptors / Indicators  Aching    Pain Type  Chronic pain    Pain Onset  More than a month ago    Pain Frequency  Intermittent    Aggravating  Factors   sitting and pressure on the back of the leg    Multiple Pain Sites  No                       [facility] Adult PT Treatment/Exercise - 05/09/18 0001      Neuro Re-ed    Neuro Re-ed Details   verbal cues and mirror to correct lateral shift in standing; thoracic rotation stretches in sitting for improved posture      Lumbar Exercises: Stretches   Hip Flexor Stretch  Right;Left;2 reps;30 seconds    Gastroc Stretch  Right;Left;2 reps;20 seconds      Lumbar Exercises: Supine   Clam  20 reps double and single leg - blue band    Bridge  20 reps with pelvic tilt and     Other Supine Lumbar Exercises  leg lengthener      Manual Therapy   Manual Therapy  Soft tissue mobilization;Joint mobilization    Manual therapy comments  in prone    Joint Mobilization  T11-12 left rotation and extension mobs    Soft tissue mobilization  right QL and lumbar paraspinals, thoracolumbar fascial release  PT Long Term Goals - 05/07/18 1236      PT LONG TERM GOAL #2   Title  report 50% improvement in going to sleep for improved function    Baseline  it is not hurting when going to sleep    Time  8    Period  Weeks    Status  Achieved      PT LONG TERM GOAL #4   Title  FOTO improved to </= 40% limited for improved function and decreased pain      PT LONG TERM GOAL #5   Title  report 50% improvement in pain and symptoms for improved function    Baseline  70% improved    Time  8    Period  Weeks    Status  Achieved            Plan - 05/09/18 1538    Clinical Impression Statement  Pt has been able to sleep on right side.  She is having some pain being her right leg today.  Pt responded well to exercises and manual treatment.  She has tight lumbar paraspinals and QL on the right contributing to lateral shift. She was able to correct slightly with manual treatment and cues during treatment. Pt will benefit from skilled PT to address posture and strength  deficits.    Rehab Potential  Good    PT Treatment/Interventions  ADLs/Self Care Home Management;Cryotherapy;Electrical Stimulation;Moist Heat;Traction;Therapeutic exercise;Therapeutic activities;Functional mobility training;Stair training;Gait training;Ultrasound;Neuromuscular re-education;Patient/family education;Manual techniques;Taping;Dry needling    PT Next Visit Plan  continue with core and glute strength, leg lengthener and STM to right QL as needed    PT Home Exercise Plan  Access Code: [email or web address]    Consulted and Agree with Plan of Care  Patient       Patient will benefit from skilled therapeutic intervention in order to improve the following deficits and impairments:  Increased fascial restricitons, Increased muscle spasms, Pain, Decreased strength, Difficulty walking, Decreased mobility, Decreased activity tolerance, Impaired flexibility, Decreased range of motion, Postural dysfunction  Visit Diagnosis: No diagnosis found.     Problem List Patient Active Problem List   Diagnosis Date Noted  . Coronary artery calcification 03/19/2018  . Varicose veins of lower extremities with other complications 30/86/5784    Zannie Cove, PT 05/09/2018, 3:46 PM  [facility] [address], [place], [place], [address] Phone: [phone]   Fax:  [phone]  Name: Karen Bonilla MRN: [id number] Date of Birth: Sep 24, 1941

## 2018-05-14 ENCOUNTER — Ambulatory Visit: Payer: Medicare Other | Admitting: Physical Therapy

## 2018-05-14 ENCOUNTER — Encounter: Payer: Self-pay | Admitting: Physical Therapy

## 2018-05-14 DIAGNOSIS — G8929 Other chronic pain: Secondary | ICD-10-CM

## 2018-05-14 DIAGNOSIS — M5441 Lumbago with sciatica, right side: Secondary | ICD-10-CM | POA: Diagnosis not present

## 2018-05-14 DIAGNOSIS — M25552 Pain in left hip: Secondary | ICD-10-CM

## 2018-05-14 DIAGNOSIS — R2689 Other abnormalities of gait and mobility: Secondary | ICD-10-CM

## 2018-05-14 DIAGNOSIS — M6281 Muscle weakness (generalized): Secondary | ICD-10-CM

## 2018-05-14 NOTE — Therapy (Signed)
[facility] [address], [place], [place], [address] Phone: [phone]   Fax:  [phone]  Physical Therapy Treatment  Patient Details  Name: Karen Bonilla MRN: [id number] Date of Birth: Nov 15, 1941 Referring Provider: Leighton Ruff, MD   Encounter Date: 05/14/2018  PT End of Session - 05/14/18 1317    Visit Number  7    Number of Visits  12    Date for PT Re-Evaluation  06/03/18    Authorization Type  UHC Medicare    PT Start Time  1230    PT Stop Time  1310    PT Time Calculation (min)  40 min    Activity Tolerance  Patient tolerated treatment well    Behavior During Therapy  [facility] for tasks assessed/performed       Past Medical History:  Diagnosis Date  . Anemia   . Colon polyps   . COPD (chronic obstructive pulmonary disease) ([place])   . Hyperlipidemia   . Mucoid cyst of joint   . Spinal stenosis   . Varicose veins of lower extremities with other complications 1/43/8887    Past Surgical History:  Procedure Laterality Date  . ABDOMINAL HYSTERECTOMY    . MASS EXCISION  01/11/2012   Procedure: MINOR EXCISION OF MASS;  Surgeon: Cammie Sickle., MD;  Location: [place];  Service: Orthopedics;  Laterality: Right;  excision mucoid cyst right index and dip joint debridement    There were no vitals filed for this visit.  Subjective Assessment - 05/14/18 1233    Subjective  Lt shoulder and arm are a little bit painful, thinks it is because she's in quadruped working in the yard and stabilizing with LUE, improved with alternating.  back is "about the same."  legs improved after DN    Patient Stated Goals  improve pain, get to sleep easier    Currently in Pain?  No/denies                       [facility] Adult PT Treatment/Exercise - 05/14/18 1232      Self-Care   Other Self-Care Comments   reviewed tennis ball use for STM and myofascial release      Lumbar Exercises:  Stretches   Passive Hamstring Stretch  Right;Left;3 reps;30 seconds    Single Knee to Chest Stretch  Right;Left;10 seconds;5 reps      Lumbar Exercises: Aerobic   Nustep  L2 x 6 min      Lumbar Exercises: Supine   Ab Set  10 reps;5 seconds    Bridge  10 reps with pelvic tilt      Lumbar Exercises: Quadruped   [place]/Old Horse  10 reps    Other Quadruped Lumbar Exercises  childs pose 3x15 sec                  PT Long Term Goals - 05/14/18 1318      PT LONG TERM GOAL #1   Title  Independent with HEP    Status  New    Target Date  06/03/18      PT LONG TERM GOAL #2   Title  report 50% improvement in going to sleep for improved function    Status  Achieved      PT LONG TERM GOAL #3   Title  improve RLE strength to at least 4/5 for improved function     Status  New  PT LONG TERM GOAL #4   Title  FOTO improved to </= 40% limited for improved function and decreased pain    Status  New      PT LONG TERM GOAL #5   Title  report 50% improvement in pain and symptoms for improved function    Status  Achieved            Plan - 05/14/18 1318    Clinical Impression Statement  Pt progressing well and reporting leg pain is improved, and overall doing more activity at home.  LLE seems to have new onset of pain but mild at this time.  Will see next week for additional DN session, then pt going to beach for at least 1 week.  Will plan to reassess when she returns if needed.    Rehab Potential  Good    PT Treatment/Interventions  ADLs/Self Care Home Management;Cryotherapy;Electrical Stimulation;Moist Heat;Traction;Therapeutic exercise;Therapeutic activities;Functional mobility training;Stair training;Gait training;Ultrasound;Neuromuscular re-education;Patient/family education;Manual techniques;Taping;Dry needling    PT Next Visit Plan  continue with core and glute strength, leg lengthener and STM to right QL as needed    PT Home Exercise Plan  Access Code: [email or web address]     Consulted and Agree with Plan of Care  Patient       Patient will benefit from skilled therapeutic intervention in order to improve the following deficits and impairments:  Increased fascial restricitons, Increased muscle spasms, Pain, Decreased strength, Difficulty walking, Decreased mobility, Decreased activity tolerance, Impaired flexibility, Decreased range of motion, Postural dysfunction  Visit Diagnosis: Chronic right-sided low back pain with right-sided sciatica  Muscle weakness (generalized)  Pain in left hip  Other abnormalities of gait and mobility     Problem List Patient Active Problem List   Diagnosis Date Noted  . Coronary artery calcification 03/19/2018  . Varicose veins of lower extremities with other complications 07/62/2633      Karen Bonilla, PT, DPT 05/14/18 1:20 PM     [facility] [address] [place], [place], [address] Phone: [phone]   Fax:  [phone]  Name: Karen Bonilla MRN: [id number] Date of Birth: 1941-01-25

## 2018-05-21 ENCOUNTER — Ambulatory Visit: Payer: Medicare Other | Admitting: Physical Therapy

## 2018-05-31 ENCOUNTER — Ambulatory Visit: Payer: Medicare Other | Attending: Family Medicine | Admitting: Physical Therapy

## 2018-05-31 DIAGNOSIS — M6281 Muscle weakness (generalized): Secondary | ICD-10-CM | POA: Insufficient documentation

## 2018-05-31 DIAGNOSIS — G8929 Other chronic pain: Secondary | ICD-10-CM | POA: Diagnosis present

## 2018-05-31 DIAGNOSIS — M5441 Lumbago with sciatica, right side: Secondary | ICD-10-CM | POA: Diagnosis present

## 2018-05-31 NOTE — Therapy (Signed)
[facility] [address], [place], [place], [address] Phone: [phone]   Fax:  [phone]  Physical Therapy Treatment  Patient Details  Name: Karen Bonilla MRN: [id number] Date of Birth: Jan 03, 1941 Referring Provider: Leighton Ruff, MD   Encounter Date: 05/31/2018  PT End of Session - 05/31/18 1101    Visit Number  8    Number of Visits  12    Date for PT Re-Evaluation  06/03/18    Authorization Type  [employer] Medicare    PT Start Time  1059    PT Stop Time  1149    PT Time Calculation (min)  50 min    Activity Tolerance  Patient tolerated treatment well    Behavior During Therapy  [facility] for tasks assessed/performed       Past Medical History:  Diagnosis Date  . Anemia   . Colon polyps   . COPD (chronic obstructive pulmonary disease) ([place])   . Hyperlipidemia   . Mucoid cyst of joint   . Spinal stenosis   . Varicose veins of lower extremities with other complications 1/69/6789    Past Surgical History:  Procedure Laterality Date  . ABDOMINAL HYSTERECTOMY    . MASS EXCISION  01/11/2012   Procedure: MINOR EXCISION OF MASS;  Surgeon: Cammie Sickle., MD;  Location: [facility];  Service: Orthopedics;  Laterality: Right;  excision mucoid cyst right index and dip joint debridement    There were no vitals filed for this visit.  Subjective Assessment - 05/31/18 1103    Subjective  I had some pain down the back of the right leg when sitting, but repositioning with pillow helped    Limitations  Walking;Standing;House hold activities    Currently in Pain?  No/denies         [facility] PT Assessment - 05/31/18 0001      Strength   Right Hip Extension  4/5    Right Hip ABduction  4/5    Left Hip Flexion  4/5    Left Hip Extension  4/5    Left Hip ABduction  4/5    Right Knee Flexion  5/5    Left Knee Flexion  5/5                   [facility] Adult PT Treatment/Exercise - 05/31/18 0001       Lumbar Exercises: Aerobic   Nustep  L2 x 7 min      Manual Therapy   Soft tissue mobilization  lumbar paraspinals, QL, glutes, hamstring - all right side       Trigger Point Dry Needling - 05/31/18 1203    Muscles Treated Lower Body  Hamstring    Hamstring Response  Twitch response elicited;Palpable increased muscle length                PT Long Term Goals - 05/31/18 1204      PT LONG TERM GOAL #1   Title  Independent with HEP    Period  Weeks    Status  Achieved      PT LONG TERM GOAL #2   Title  report 50% improvement in going to sleep for improved function    Status  Achieved      PT LONG TERM GOAL #3   Title  improve RLE strength to at least 4/5 for improved function     Baseline  see above findings    Status  Achieved      PT LONG TERM GOAL #4   Title  FOTO improved to </= 40% limited for improved function and decreased pain    Baseline  44% down from 54%    Time  8    Period  Weeks    Status  Partially Met      PT LONG TERM GOAL #5   Title  report 50% improvement in pain and symptoms for improved function    Status  Achieved            Plan - 05/31/18 1159    Clinical Impression Statement  Pt is doing overall much better and was able to lift grandson and play with grandchildren on the beach.  Pt is ind with HEP and will discharge with HEP today.  See above met goals.    PT Treatment/Interventions  ADLs/Self Care Home Management;Cryotherapy;Electrical Stimulation;Moist Heat;Traction;Therapeutic exercise;Therapeutic activities;Functional mobility training;Stair training;Gait training;Ultrasound;Neuromuscular re-education;Patient/family education;Manual techniques;Taping;Dry needling    PT Next Visit Plan  discharged    Consulted and Agree with Plan of Care  Patient       Patient will benefit from skilled therapeutic intervention in order to improve the following deficits and impairments:  Increased fascial restricitons, Increased muscle  spasms, Pain, Decreased strength, Difficulty walking, Decreased mobility, Decreased activity tolerance, Impaired flexibility, Decreased range of motion, Postural dysfunction  Visit Diagnosis: Chronic right-sided low back pain with right-sided sciatica  Muscle weakness (generalized)     Problem List Patient Active Problem List   Diagnosis Date Noted  . Coronary artery calcification 03/19/2018  . Varicose veins of lower extremities with other complications 96/75/9163    Zannie Cove, PT 05/31/2018, 12:05 PM  [facility] [address], [place], [place], [address] Phone: [phone]   Fax:  [phone]  Name: Karen Bonilla MRN: [id number] Date of Birth: May 11, 1941  PHYSICAL THERAPY DISCHARGE SUMMARY  Visits from Start of Care: 8  Current functional level related to goals / functional outcomes: See above goals   Remaining deficits: See above   Education / Equipment: HEP  Plan: Patient agrees to discharge.  Patient goals were partially met. Patient is being discharged due to being pleased with the current functional level.  ?????    Google, PT 05/31/18 12:07 PM

## 2018-06-03 ENCOUNTER — Ambulatory Visit: Payer: Medicare Other | Admitting: Physical Therapy

## 2019-02-17 ENCOUNTER — Ambulatory Visit (INDEPENDENT_AMBULATORY_CARE_PROVIDER_SITE_OTHER)
Admission: RE | Admit: 2019-02-17 | Discharge: 2019-02-17 | Disposition: A | Discharge status: Home / Self Care | Payer: Medicare Other | Source: Ambulatory Visit | Attending: Acute Care | Admitting: Acute Care

## 2019-02-17 DIAGNOSIS — F17210 Nicotine dependence, cigarettes, uncomplicated: Secondary | ICD-10-CM | POA: Diagnosis not present

## 2019-02-17 DIAGNOSIS — Z122 Encounter for screening for malignant neoplasm of respiratory organs: Secondary | ICD-10-CM

## 2019-02-19 ENCOUNTER — Telehealth: Payer: Self-pay | Admitting: Acute Care

## 2019-02-19 NOTE — Telephone Encounter (Signed)
Pt informed of CT results per Eric Form, NP.  PT verbalized understanding.  Copy sent to PCP.  PT advised that she no longer qualifies due to current age of 50.

## 2019-02-19 NOTE — Telephone Encounter (Signed)
[place] x 1 to give Lung Screening CT Results

## 2019-07-29 ENCOUNTER — Other Ambulatory Visit: Payer: Self-pay | Admitting: Physician Assistant

## 2019-11-04 ENCOUNTER — Other Ambulatory Visit: Payer: Self-pay | Admitting: Physician Assistant

## 2020-01-14 ENCOUNTER — Ambulatory Visit: Payer: Medicare PPO | Attending: Internal Medicine

## 2020-01-14 DIAGNOSIS — Z23 Encounter for immunization: Secondary | ICD-10-CM | POA: Insufficient documentation

## 2020-01-14 NOTE — Progress Notes (Signed)
   Covid-19 Vaccination Clinic  Name:  Karen Bonilla    MRN: [id number] DOB: Jan 26, 1941  01/14/2020  [name] was observed post Covid-19 immunization for 15 minutes without incidence. She was provided with Vaccine Information Sheet and instruction to access the V-Safe system.   [name] was instructed to call 911 with any severe reactions post vaccine: Marland Kitchen Difficulty breathing  . Swelling of your face and throat  . A fast heartbeat  . A bad rash all over your body  . Dizziness and weakness    Immunizations Administered    Name Date Dose VIS Date Route   Pfizer COVID-19 Vaccine 01/14/2020  1:38 PM 0.3 mL 12/05/2019 Intramuscular   Manufacturer: [place]   Lot: [id number]   [place]: [id number]

## 2020-02-04 ENCOUNTER — Ambulatory Visit: Payer: Medicare PPO | Attending: Internal Medicine

## 2020-02-04 DIAGNOSIS — Z23 Encounter for immunization: Secondary | ICD-10-CM | POA: Insufficient documentation

## 2020-02-04 NOTE — Progress Notes (Signed)
   Covid-19 Vaccination Clinic  Name:  Karen Bonilla    MRN: [id number] DOB: Feb 13, 1941  02/04/2020  [name] was observed post Covid-19 immunization for 15 minutes without incidence. She was provided with Vaccine Information Sheet and instruction to access the V-Safe system.   [name] was instructed to call 911 with any severe reactions post vaccine: Marland Kitchen Difficulty breathing  . Swelling of your face and throat  . A fast heartbeat  . A bad rash all over your body  . Dizziness and weakness    Immunizations Administered    Name Date Dose VIS Date Route   Pfizer COVID-19 Vaccine 02/04/2020  2:22 PM 0.3 mL 12/05/2019 Intramuscular   Manufacturer: [place]   Lot: [id number]   [place]: [id number]

## 2020-07-09 ENCOUNTER — Telehealth: Payer: Self-pay | Admitting: Acute Care

## 2020-07-09 DIAGNOSIS — F17210 Nicotine dependence, cigarettes, uncomplicated: Secondary | ICD-10-CM

## 2020-07-09 DIAGNOSIS — Z87891 Personal history of nicotine dependence: Secondary | ICD-10-CM

## 2020-07-19 NOTE — Telephone Encounter (Signed)
I have now spoken with Karen Bonilla and her LCS CT has been scheduled for 07/28/2020 @ 2:00pm and she is aware that it is scheduled at [place]

## 2020-07-19 NOTE — Telephone Encounter (Signed)
I have LVM for patient to return call to schedule this CT

## 2020-07-19 NOTE — Telephone Encounter (Signed)
Spoke with pt. She states that she has spoken with her insurance company and they will cover her f/u low dose Chest CT with Billing code of [id number]. CT order has been placed. Will forward to [place] for scheduling.

## 2020-07-28 ENCOUNTER — Telehealth: Payer: Self-pay | Admitting: Acute Care

## 2020-07-28 ENCOUNTER — Ambulatory Visit (INDEPENDENT_AMBULATORY_CARE_PROVIDER_SITE_OTHER)
Admission: RE | Admit: 2020-07-28 | Discharge: 2020-07-28 | Disposition: A | Discharge status: Home / Self Care | Payer: Medicare PPO | Source: Ambulatory Visit | Attending: Acute Care | Admitting: Acute Care

## 2020-07-28 ENCOUNTER — Other Ambulatory Visit: Payer: Self-pay

## 2020-07-28 DIAGNOSIS — F17210 Nicotine dependence, cigarettes, uncomplicated: Secondary | ICD-10-CM | POA: Diagnosis not present

## 2020-07-28 DIAGNOSIS — Z87891 Personal history of nicotine dependence: Secondary | ICD-10-CM | POA: Diagnosis not present

## 2020-07-28 NOTE — Telephone Encounter (Signed)
I have called the pt and scheduled her visit with TP- next available possible was 08/05/20

## 2020-07-28 NOTE — Telephone Encounter (Signed)
[facility] Radiology and spoke with Opal Sidles- calling on ct chest 07/28/20   IMPRESSION: Lung-RADS 4B, suspicious. Additional imaging evaluation or consultation with Pulmonology or Thoracic Surgery recommended.  10.1 mm irregular branching nodule in the posterior left lower lobe, new. Follow up low-dose chest CT without contrast in 3 months (please use the following order, "CT CHEST LCS NODULE FOLLOW-UP W/O CM") is recommended. Alternatively, PET may be considered.  Aortic Atherosclerosis (ICD10-I70.0) and Emphysema (ICD10-J43.9).  Forwarding to Judson Roch marked urgent as well as APP of the day since Judson Roch off this wk to be sure this is addressed   Electronically Signed   By: Julian Hy M.D.   On: 07/28/2020 15:42

## 2020-07-28 NOTE — Telephone Encounter (Signed)
Can you please see if there are any openings with APP in the next week to go over results?

## 2020-08-02 ENCOUNTER — Telehealth: Payer: Self-pay | Admitting: Acute Care

## 2020-08-02 DIAGNOSIS — F17210 Nicotine dependence, cigarettes, uncomplicated: Secondary | ICD-10-CM

## 2020-08-02 DIAGNOSIS — Z87891 Personal history of nicotine dependence: Secondary | ICD-10-CM

## 2020-08-02 NOTE — Telephone Encounter (Signed)
Dr. Linus Galas, I was wondering if there was a reason you wanted this Lung Cancer Screening 4 B repeated in 3 months vs follow up now. Do you feel this is more likely  infection / inflammatory vs suspicious for malignancy? Let me know as I would like to call the patient and give her as much information as possible. .  Thanks so much.

## 2020-08-02 NOTE — Telephone Encounter (Signed)
I called the patient to discuss the results of her Low Dose CT chest. There was no answer. I have left a message on her answering machine and  asked her to call the office to review these results with me. Contact number was left.  If I do not hear from her today, I will attempt again tomorrow. Thanks so much  Langley Gauss, This patient will need a 3 month follow up scheduled. If you can also fax results to her PCP, and let Dr. Drema Dallas  know we think this inflammatory, and we are scheduling a 3 month follow up to ensure resolution/ smaller size in 3 months. Thanks so much.

## 2020-08-02 NOTE — Progress Notes (Signed)
I called the patient to discuss the results of her Low Dose CT chest. There was no answer. I have left a message on her answering machine and  asked her to call the office to review these results with me. Contact number was left.  If I do not hear from her today, I will attempt again tomorrow. Thanks so much  Langley Gauss, This patient will need a 3 month follow up scheduled. If you can also fax results to her PCP, and let Dr. Drema Dallas  know we think this inflammatory, and we are scheduling a 3 month follow up to ensure resolution/ smaller size in 3 months. Thanks so much.

## 2020-08-02 NOTE — Telephone Encounter (Signed)
Called and spoke with patient letting her know that I was going to transfer her to Judson Roch to review her CT with her. Patient expressed understanding. Patient transferred to [phone]. Patient CT has been sent to her PCP per [facility] request. Patient has appointment scheduled for 08/05/20 with Tammy to review CT. That will be canceled the appointment 8/12 and she will not need a follow up appointment until I see the follow up scan in November  Nothing further needed at this time

## 2020-08-02 NOTE — Telephone Encounter (Signed)
I have called this patient with the results of her low dose CT. I explained that her CT was read as a Lung RADS 4 B indicates suspicious findings for which additional diagnostic testing and or tissue sampling is recommended. I explained that the radiologist recommended a 3 month follow up. I messaged  Dr. Maryland Pink today, and he called me back. We discussed that he feels this nodule is either infectious or inflammatory. We discussed  that we will do a 3 month follow up CT to check for stability vs resolution vs growth. I explained this to the patient. She is in agreement with a 3 month follow up, and she would like to Cancel her appointment with Tammy Parrett scheduled for 08/05/2020, as she is comfortable with the telephone explanation I was able to provide. She understands she will get a call to schedule a 3 month follow up early November 2021.  Triage , please cancel the 8/12/appointment with Tammy to discuss the results of the scan. I have Dante on this message so she will know.  Langley Gauss, please order follow up LDCT first week 10/2020, and fax results to PCP, letting them know we have ordered the 3 month follow up for early November  2021.   Thanks so much everyone.

## 2020-08-03 NOTE — Telephone Encounter (Signed)
Ct results were faxed to PCP. Order placed for 3 mth f/u Chest CT Nodule f/u.

## 2020-08-03 NOTE — Addendum Note (Signed)
Addended by: Doroteo Glassman D on: 08/03/2020 09:02 AM   Modules accepted: Orders

## 2020-08-05 ENCOUNTER — Ambulatory Visit: Payer: Medicare PPO | Admitting: Adult Health

## 2020-08-06 ENCOUNTER — Ambulatory Visit: Payer: Medicare PPO | Admitting: Physician Assistant

## 2020-08-06 ENCOUNTER — Encounter: Payer: Self-pay | Admitting: Physician Assistant

## 2020-08-06 ENCOUNTER — Other Ambulatory Visit: Payer: Self-pay

## 2020-08-06 DIAGNOSIS — Z85828 Personal history of other malignant neoplasm of skin: Secondary | ICD-10-CM

## 2020-08-06 DIAGNOSIS — D0471 Carcinoma in situ of skin of right lower limb, including hip: Secondary | ICD-10-CM | POA: Diagnosis not present

## 2020-08-06 DIAGNOSIS — Z1283 Encounter for screening for malignant neoplasm of skin: Secondary | ICD-10-CM | POA: Diagnosis not present

## 2020-08-06 DIAGNOSIS — D485 Neoplasm of uncertain behavior of skin: Secondary | ICD-10-CM

## 2020-08-06 DIAGNOSIS — D1800 Hemangioma unspecified site: Secondary | ICD-10-CM

## 2020-08-06 DIAGNOSIS — L578 Other skin changes due to chronic exposure to nonionizing radiation: Secondary | ICD-10-CM | POA: Diagnosis not present

## 2020-08-06 DIAGNOSIS — L814 Other melanin hyperpigmentation: Secondary | ICD-10-CM | POA: Diagnosis not present

## 2020-08-06 DIAGNOSIS — L720 Epidermal cyst: Secondary | ICD-10-CM

## 2020-08-06 DIAGNOSIS — L821 Other seborrheic keratosis: Secondary | ICD-10-CM | POA: Diagnosis not present

## 2020-08-06 DIAGNOSIS — L570 Actinic keratosis: Secondary | ICD-10-CM | POA: Diagnosis not present

## 2020-08-06 NOTE — Patient Instructions (Signed)
Biopsy, Surgery (Curettage) & Surgery (Excision) Aftercare Instructions  1. Okay to remove bandage in 24 hours  2. Wash area with soap and water  3. Apply Vaseline to area twice daily until healed (Not Neosporin)  4. Okay to cover with a Band-Aid to decrease the chance of infection or prevent irritation from clothing; also it's okay to uncover lesion at home.  5. Suture instructions: return to our office in 7-10 or 10-14 days for a nurse visit for suture removal. Variable healing with sutures, if pain or itching occurs call our office. It's okay to shower or bathe 24 hours after sutures are given.  6. The following risks may occur after a biopsy, curettage or excision: bleeding, scarring, discoloration, recurrence, infection (redness, yellow drainage, pain or swelling).  7. If your results are positive we will contact you, if you do not hear from [place] review your MyChart.  Stay Well

## 2020-08-09 DIAGNOSIS — Z1231 Encounter for screening mammogram for malignant neoplasm of breast: Secondary | ICD-10-CM | POA: Diagnosis not present

## 2020-08-10 NOTE — Progress Notes (Addendum)
Follow-Up Visit   Subjective  Karen Bonilla is a 79 y.o. female who presents for the following: Annual Exam (concerned with spot on forehead and back of right ankle).   The following portions of the chart were reviewed this encounter and updated as appropriate: Tobacco  Allergies  Meds  Problems  Med Hx  Surg Hx  Fam Hx      Objective  Well appearing patient in no apparent distress; mood and affect are within normal limits.  A full examination was performed including scalp, head, eyes, ears, nose, lips, neck, chest, axillae, abdomen, back, buttocks, bilateral upper extremities, bilateral lower extremities, hands, feet, fingers, toes, fingernails, and toenails. All findings within normal limits unless otherwise noted below.  Objective  Head - to toe: No atypical nevi   Objective  Left Forehead, Right Lower Leg - Anterior: Scars clear  Objective  Mid upper back, Right Forearm, Right Upper Lip: Scars clear  Objective  Glabella: Pearly papule with telangectasia.      Objective  Right Lower Leg - Posterior lower: Hyperkeratotic scale with pink base      Objective  Right Lower Leg - Anterior upper: Hyperkeratotic scale with pink base      Assessment & Plan  Skin exam for malignant neoplasm Head - to toe  Skin exams  History of SCC (squamous cell carcinoma) of skin (2) Right Lower Leg - Anterior; Left Forehead  observe  History of basal cell carcinoma (BCC) (3) Right Forearm; Mid upper back; Right Upper Lip  Skin exams  Neoplasm of uncertain behavior of skin Glabella  Skin / nail biopsy Type of biopsy: tangential   Informed consent: discussed and consent obtained   Timeout: patient name, date of birth, surgical site, and procedure verified   Anesthesia: the lesion was anesthetized in a standard fashion   Anesthetic:  1% lidocaine w/ epinephrine 1-100,000 local infiltration Instrument used: flexible razor blade   Hemostasis achieved with:  aluminum chloride and electrodesiccation   Outcome: patient tolerated procedure well   Post-procedure details: wound care instructions given    Specimen 1 - Surgical pathology Differential Diagnosis: SCC vsBCC Check Margins: yes  Carcinoma in situ of skin of right lower extremity including hip (2) Right Lower Leg - Posterior lower  Skin / nail biopsy Type of biopsy: tangential   Informed consent: discussed and consent obtained   Timeout: patient name, date of birth, surgical site, and procedure verified   Anesthesia: the lesion was anesthetized in a standard fashion   Anesthetic:  1% lidocaine w/ epinephrine 1-100,000 local infiltration Instrument used: flexible razor blade   Hemostasis achieved with: aluminum chloride and electrodesiccation   Outcome: patient tolerated procedure well   Post-procedure details: wound care instructions given    Specimen 3 - Surgical pathology Differential Diagnosis: SCC vs BCC Check Margins: yes  Right Lower Leg - Anterior upper  Skin / nail biopsy Type of biopsy: tangential   Informed consent: discussed and consent obtained   Timeout: patient name, date of birth, surgical site, and procedure verified   Anesthesia: the lesion was anesthetized in a standard fashion   Anesthetic:  1% lidocaine w/ epinephrine 1-100,000 local infiltration Instrument used: flexible razor blade   Hemostasis achieved with: aluminum chloride and electrodesiccation   Outcome: patient tolerated procedure well   Post-procedure details: wound care instructions given    Specimen 2 - Surgical pathology Differential Diagnosis:  Check Margins: No Lentigines - Scattered tan macules - Discussed due to sun exposure -  Benign, observe - Call for any changes  Seborrheic Keratoses - Stuck-on, waxy, tan-brown papules and plaques  - Discussed benign etiology and prognosis. - Observe - Call for any changes  Hemangiomas - Red papules - Discussed benign nature - Observe -  Call for any changes  Actinic Damage - diffuse scaly erythematous macules with underlying dyspigmentation - Recommend daily broad spectrum sunscreen SPF 30+ to sun-exposed areas, reapply every 2 hours as needed.  - Call for new or changing lesions.  Skin cancer screening performed today.   I, Yazmyne Sara, PA-C, have reviewed all documentation's for this visit.  The documentation on 08/11/20 for the exam, diagnosis, procedures and orders are all accurate and complete.

## 2020-08-16 ENCOUNTER — Telehealth: Payer: Self-pay | Admitting: *Deleted

## 2020-08-16 NOTE — Telephone Encounter (Signed)
-----   Message from Warren Danes, [place] sent at 08/11/2020 10:26 AM EDT ----- 30 min sx

## 2020-08-16 NOTE — Telephone Encounter (Signed)
Left patient message to call back.

## 2020-08-24 ENCOUNTER — Telehealth: Payer: Self-pay | Admitting: Physician Assistant

## 2020-08-24 NOTE — Telephone Encounter (Signed)
Results (she was out of town when we called)

## 2020-08-25 ENCOUNTER — Telehealth: Payer: Self-pay

## 2020-08-25 NOTE — Telephone Encounter (Signed)
-----   Message from Warren Danes, [place] sent at 08/11/2020 10:26 AM EDT ----- 30 min sx

## 2020-08-25 NOTE — Telephone Encounter (Signed)
Path to patient surgery made with Dr Denna Haggard (ks) patient

## 2020-10-07 ENCOUNTER — Encounter: Payer: Medicare PPO | Admitting: Dermatology

## 2020-10-14 ENCOUNTER — Other Ambulatory Visit (HOSPITAL_COMMUNITY): Payer: Self-pay | Admitting: Family Medicine

## 2020-10-14 ENCOUNTER — Ambulatory Visit (HOSPITAL_COMMUNITY)
Admission: RE | Admit: 2020-10-14 | Discharge: 2020-10-14 | Disposition: A | Discharge status: Home / Self Care | Payer: Medicare PPO | Source: Ambulatory Visit | Attending: Family Medicine | Admitting: Family Medicine

## 2020-10-14 DIAGNOSIS — M7989 Other specified soft tissue disorders: Secondary | ICD-10-CM

## 2020-10-14 DIAGNOSIS — Z23 Encounter for immunization: Secondary | ICD-10-CM | POA: Diagnosis not present

## 2020-10-19 ENCOUNTER — Telehealth: Payer: Self-pay | Admitting: Dermatology

## 2020-10-19 NOTE — Telephone Encounter (Signed)
Phone call to patient regarding rescheduling her appointment with Dr. Denna Haggard for this Thursday.  Patient states that her legs are swollen and painful to touch, patient also stated that she has an appointment with her PCP on Monday. Patient advised that it's okay to reschedule her appointment at this time until she see's her PCP to see what's going on with her legs.  Patient okay with rescheduling at this time. Appointment rescheduled.

## 2020-10-19 NOTE — Telephone Encounter (Signed)
Scheduled for surgery this Thursday w/ST for skin cancers on leg BUT leg is swollen and she called her pcp who can't see her before than so she wants to know if she should still have the surgery. (Does not have a blood clot)

## 2020-10-21 ENCOUNTER — Encounter: Payer: Medicare PPO | Admitting: Dermatology

## 2020-10-25 DIAGNOSIS — L0390 Cellulitis, unspecified: Secondary | ICD-10-CM | POA: Diagnosis not present

## 2020-11-05 ENCOUNTER — Other Ambulatory Visit: Payer: Self-pay

## 2020-11-05 ENCOUNTER — Ambulatory Visit (INDEPENDENT_AMBULATORY_CARE_PROVIDER_SITE_OTHER)
Admission: RE | Admit: 2020-11-05 | Discharge: 2020-11-05 | Disposition: A | Discharge status: Home / Self Care | Payer: Medicare PPO | Source: Ambulatory Visit | Attending: Acute Care | Admitting: Acute Care

## 2020-11-05 DIAGNOSIS — F17210 Nicotine dependence, cigarettes, uncomplicated: Secondary | ICD-10-CM | POA: Diagnosis not present

## 2020-11-05 DIAGNOSIS — J439 Emphysema, unspecified: Secondary | ICD-10-CM | POA: Diagnosis not present

## 2020-11-05 DIAGNOSIS — R911 Solitary pulmonary nodule: Secondary | ICD-10-CM | POA: Diagnosis not present

## 2020-11-05 DIAGNOSIS — Z87891 Personal history of nicotine dependence: Secondary | ICD-10-CM | POA: Diagnosis not present

## 2020-11-05 DIAGNOSIS — I2510 Atherosclerotic heart disease of native coronary artery without angina pectoris: Secondary | ICD-10-CM | POA: Diagnosis not present

## 2020-11-05 DIAGNOSIS — I700 Atherosclerosis of aorta: Secondary | ICD-10-CM | POA: Diagnosis not present

## 2020-11-08 NOTE — Progress Notes (Signed)
Please call patient and let them  know their  low dose Ct was read as a Lung RADS 2: nodules that are benign in appearance and behavior with a very low likelihood of becoming a clinically active cancer due to size or lack of growth. Recommendation per radiology is for a repeat LDCT in 12 months. .Please let them  know we will order and schedule their  annual screening scan for 10/2021. Please let them  know there was notation of CAD on their  scan.  Please remind the patient  that this is a non-gated exam therefore degree or severity of disease  cannot be determined. Please have them  follow up with their PCP regarding potential risk factor modification, dietary therapy or pharmacologic therapy if clinically indicated. Pt.  is  currently on statin therapy. Please place order for annual  screening scan for  10/2021 and fax results to PCP. Thanks so much.  Please let her know the area of concern on the August scan has decreased in size and is most likely related to an impacted airway from infectious/inflammatory etiology. Thanks so much

## 2020-11-10 ENCOUNTER — Other Ambulatory Visit: Payer: Self-pay | Admitting: *Deleted

## 2020-11-10 DIAGNOSIS — F17210 Nicotine dependence, cigarettes, uncomplicated: Secondary | ICD-10-CM

## 2020-11-10 DIAGNOSIS — Z87891 Personal history of nicotine dependence: Secondary | ICD-10-CM

## 2020-11-29 DIAGNOSIS — R1032 Left lower quadrant pain: Secondary | ICD-10-CM | POA: Diagnosis not present

## 2020-11-29 DIAGNOSIS — I868 Varicose veins of other specified sites: Secondary | ICD-10-CM | POA: Diagnosis not present

## 2020-12-08 ENCOUNTER — Other Ambulatory Visit: Payer: Self-pay

## 2020-12-08 ENCOUNTER — Ambulatory Visit: Payer: Medicare PPO | Attending: Family Medicine

## 2020-12-08 DIAGNOSIS — M25551 Pain in right hip: Secondary | ICD-10-CM | POA: Insufficient documentation

## 2020-12-08 DIAGNOSIS — M25552 Pain in left hip: Secondary | ICD-10-CM | POA: Diagnosis not present

## 2020-12-08 DIAGNOSIS — M6281 Muscle weakness (generalized): Secondary | ICD-10-CM | POA: Insufficient documentation

## 2020-12-08 DIAGNOSIS — R2689 Other abnormalities of gait and mobility: Secondary | ICD-10-CM | POA: Insufficient documentation

## 2020-12-08 NOTE — Patient Instructions (Signed)
Access Code: [email or web address] URL: [email or web address] Date: 12/08/2020 Prepared by: Claiborne Billings  Exercises Supine Butterfly Groin Stretch - 2 x daily - 7 x weekly - 1 sets - 5 reps - 10-20 hold Seated Figure 4 Piriformis Stretch - 1 x daily - 7 x weekly - 3 sets - 10 reps Seated March - 3 x daily - 7 x weekly - 2 sets - 10 reps Hip Flexor Stretch on Step - 2 x daily - 7 x weekly - 1 sets - 3 reps - 20 hold

## 2020-12-08 NOTE — Therapy (Signed)
[facility] [address], [place], [place], [address] Phone: [phone]   Fax:  [phone]  Physical Therapy Evaluation  Patient Details  Name: Karen Bonilla MRN: [id number] Date of Birth: 03-01-1941 Referring Provider (PT): Leighton Ruff, MD   Encounter Date: 12/08/2020   PT End of Session - 12/08/20 1220    Visit Number 1    Date for PT Re-Evaluation 02/02/21    Authorization Type Cohere-authorization submitted    PT Start Time 1146    PT Stop Time 1223    PT Time Calculation (min) 37 min    Activity Tolerance Patient tolerated treatment well    Behavior During Therapy [facility] for tasks assessed/performed           Past Medical History:  Diagnosis Date  . Anemia   . Basal cell carcinoma 09/01/2002   right shin tx exc  . BCC (basal cell carcinoma of skin) 06/20/2013   right forearm TX WITH BX  . BCC (basal cell carcinoma of skin) 06/20/2013   right upper lip TX WITH BX  . BCC (basal cell carcinoma of skin) 10/03/2016   mid upper back [place]  . Colon polyps   . COPD (chronic obstructive pulmonary disease) ([place])   . Hyperlipidemia   . Mucoid cyst of joint   . SCC (squamous cell carcinoma) 01/23/2011   left forearm scc/ka  TX CX3 5FU CAUTERY  . SCC (squamous cell carcinoma) 10/03/2016   right shin scc/ka TX CX3 5FU CAUTERY  . SCC (squamous cell carcinoma) 07/29/2019   left forehead scc TX CX3 5FU CAUTERY  . Spinal stenosis   . Squamous cell carcinoma of skin 10/23/2005   left lower shin inf. bowens  TX CX3 5FU CAUTERY  . Varicose veins of lower extremities with other complications 2/63/7858    Past Surgical History:  Procedure Laterality Date  . ABDOMINAL HYSTERECTOMY    . MASS EXCISION  01/11/2012   Procedure: MINOR EXCISION OF MASS;  Surgeon: Cammie Sickle., MD;  Location: [facility];  Service: Orthopedics;  Laterality: Right;  excision mucoid cyst right index and dip  joint debridement    There were no vitals filed for this visit.    Subjective Assessment - 12/08/20 1149    Subjective Pt presents to PT with Lt groin pain that began and has worsened over the past 6-8 months.    Pertinent History DDD and spinal stenosis of the lumbar spine, COPD    How long can you stand comfortably? limitations are due to chronic LBP    Diagnostic tests none    Patient Stated Goals reduce Lt hip pain with standing    Currently in Pain? Yes    Pain Score 0-No pain   6/10 max after sitting   Pain Location Hip    Pain Orientation Left    Pain Descriptors / Indicators Shooting    Pain Type Chronic pain    Pain Radiating Towards Lt LE    Pain Onset More than a month ago    Pain Frequency Intermittent    Aggravating Factors  getting up from sitting, getting out of car, walking    Pain Relieving Factors change of position              [facility] PT Assessment - 12/08/20 0001      Assessment   Medical Diagnosis Lt groin pain    Referring Provider (PT) Leighton Ruff, MD  Onset Date/Surgical Date 05/25/20    Next MD Visit none    Prior Therapy for low back, not hip      Precautions   Precautions None      Restrictions   Weight Bearing Restrictions No      Balance Screen   Has the patient fallen in the past 6 months No    Has the patient had a decrease in activity level because of a fear of falling?  No    Is the patient reluctant to leave their home because of a fear of falling?  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  Extension limited by 50% due to spinal stenosis and pain      PROM   Overall PROM  Within functional limits for tasks performed    Overall PROM Comments hip P/ROM is WFLs with Lt hip pain with hip IR/ER      Strength   Overall Strength Deficits    Overall Strength Comments core strength 4/5    Strength Assessment Site Hip;Knee    Right/Left Hip Left;Right    Right Hip Flexion 4+/5    Right Hip ABduction 4/5    Left Hip Flexion 4-/5    Left Hip ABduction 4-/5    Right/Left Knee Right;Left    Right Knee Flexion 4+/5    Right Knee Extension 4+/5    Left Knee Flexion 4+/5    Left Knee Extension 4+/5      Palpation   Palpation comment palpable tenderness over Lt proximal hip flexors and adductors      Transfers   Transfers Stand to Sit;Sit to Stand    Sit to Stand 6: Modified independent (Device/Increase time);Without upper extremity assist    Five time sit to stand comments  18.39 seconds without UE support    Stand to Sit 6: Modified independent (Device/Increase time);With upper extremity assist      Ambulation/Gait   Ambulation/Gait Yes    Ambulation/Gait Assistance 6: Modified independent (Device/Increase time)    Gait Pattern Step-through pattern;Decreased step length - right;Decreased step length - left;Right flexed knee in stance;Left flexed knee in stance;Trendelenburg;Lateral hip instability;Trunk flexed                      Objective measurements completed on examination: See above  findings.  PT Education - 12/08/20 1219    Education Details Access Code: [email or web address]    Person(s) Educated Patient    Methods Explanation;Demonstration;Handout    Comprehension Verbalized understanding;Returned demonstration            PT Short Term Goals - 12/08/20 1148      PT SHORT TERM GOAL #1   Title independent with initial HEP    Time 4    Period Weeks    Status New    Target Date 01/05/21      PT SHORT TERM GOAL #2   Title report < or = to 4/10 Lt hip/LE pain when coming from sit to stand and getting out of the car    Time 4    Period Weeks    Status New    Target Date 01/05/21      PT SHORT TERM GOAL #3   Title report a 30% reduction in the frequency and intensity of Lt hip/leg pain with change of position and walking    Time 4    Period Weeks    Status New    Target Date 01/05/21             PT Long Term Goals - 12/08/20 1201      PT LONG TERM GOAL #1   Title Independent with HEP    Time 8    Period Weeks    Status New    Target Date 02/02/21      PT LONG TERM GOAL #2   Title report < or = to 2/10 Lt hip pain with sit to stand and getting out of the car    Baseline 6/10    Time 8    Period Weeks    Status New    Target Date 02/02/21      PT LONG TERM GOAL #3   Title perform 5x sit to stand in < or = to 13 seconds to reduce falls risk    Baseline --    Time 8    Period Weeks    Status New    Target Date 02/02/21      PT LONG TERM GOAL #4   Title report a 70% reduction in the frequency and intensity of Lt hip pain with change of position and walking    Baseline --    Time 8    Period Weeks    Status New    Target Date 02/02/21      PT LONG TERM GOAL #5   Title demonstate 4+/5 Lt hip strength to improve stability and endurance with transitions and community activity    Baseline --    Time 8    Period Weeks    Status New                  Plan - 12/08/20 1239    Clinical Impression Statement Pt  presents to PT with Rt hip/groin pain that began 6-8 months ago without any incident or injury.  No recent imaging has been done but pt does report lumbar DDD and spinal stenosis.  Pt has limitations in mobility and endurance tasks related to chronic lumbar dysfunction.  Pt reports up to 6/10 Lt hip/groin pain that is worse after sitting and coming to stand, getting out of the car and with walking.  Pain is reduced with sitting/resting.  Pt with chronic history of spinal stenosis and presents with trunk flexion, Rt pelvic shirt and Lt trunk lateral flexion.  Gait is symmetrical with reduced step length and hip instability bilaterally.  5x sit to stand is 18.39 seconds indicating a falls risk and Lt hip strength is reduced in flexion and abduction.  Pt with Rt=Lt hip P/ROM with Lt hip pain reported with IR/ER.  Pt with palpable tenderness over Lt proximal hip flexors and adductors.  Pt will benefit from skilled PT to address functional hip/core strength, improve balance and address Lt hip pain and muscle tension.    Personal Factors and Comorbidities Comorbidity 2    Comorbidities lumbar stenosis, DDD and COPD    Examination-Activity Limitations Sit;Squat;Stand;Transfers    Examination-Participation Restrictions Driving;Community Activity    Stability/Clinical Decision Making Evolving/Moderate complexity    Clinical Decision Making Moderate    Rehab Potential Good    PT Frequency 2x / week    PT Duration 8 weeks    PT Treatment/Interventions ADLs/Self Care Home Management;Cryotherapy;Electrical Stimulation;Moist Heat;Iontophoresis 4mg /ml Dexamethasone;Stair training;Gait training;Functional mobility training;Therapeutic activities;Therapeutic exercise;Balance training;Neuromuscular re-education;Manual techniques;Patient/family education;Passive range of motion;Dry needling;Taping    PT Next Visit Plan Lt hip flexor mobility, hip flexibility, lumbopelvic strength, balance and endurance.  Dry needling to Lt  hip flexors and adductors.    PT Home Exercise Plan Access Code: [email or web address]           Patient will benefit from skilled therapeutic intervention in order to improve the following deficits and impairments:  Abnormal gait,Decreased activity tolerance,Decreased balance,Postural dysfunction,Decreased strength,Impaired flexibility,Improper body mechanics,Pain,Decreased endurance,Increased muscle spasms,Difficulty walking,Decreased range of motion  Visit Diagnosis: Pain in left hip - Plan: PT plan of care cert/re-cert, CANCELED: PT plan of care cert/re-cert  Other abnormalities of gait and mobility - Plan: PT plan of care cert/re-cert, CANCELED: PT plan of care cert/re-cert  Muscle weakness (generalized) - Plan: PT plan of care cert/re-cert, CANCELED: PT plan of care cert/re-cert     Problem List Patient Active Problem List   Diagnosis Date Noted  . Coronary artery calcification 03/19/2018  . Ganglion of left wrist 03/07/2017  . Varicose veins of lower extremities with other complications 90/38/3338     Sigurd Sos, PT 12/08/20 1:15 PM  [facility] [address], [place], [place], [address] Phone: [phone]   Fax:  [phone]  Name: LAVAYA DEFREITAS MRN: [id number] Date of Birth: 1941/05/19

## 2020-12-14 ENCOUNTER — Other Ambulatory Visit: Payer: Self-pay

## 2020-12-14 ENCOUNTER — Ambulatory Visit: Payer: Medicare PPO | Admitting: Physical Therapy

## 2020-12-14 ENCOUNTER — Encounter: Payer: Self-pay | Admitting: Physical Therapy

## 2020-12-14 DIAGNOSIS — M25551 Pain in right hip: Secondary | ICD-10-CM | POA: Diagnosis not present

## 2020-12-14 DIAGNOSIS — M6281 Muscle weakness (generalized): Secondary | ICD-10-CM | POA: Diagnosis not present

## 2020-12-14 DIAGNOSIS — R2689 Other abnormalities of gait and mobility: Secondary | ICD-10-CM

## 2020-12-14 DIAGNOSIS — M25552 Pain in left hip: Secondary | ICD-10-CM | POA: Diagnosis not present

## 2020-12-14 NOTE — Therapy (Signed)
[facility] [address] [place], [place], [address] Phone: [phone]   Fax:  [phone]  Physical Therapy Treatment  Patient Details  Name: Karen Bonilla MRN: [id number] Date of Birth: 11/13/1941 Referring Provider (PT): Leighton Ruff, MD   Encounter Date: 12/14/2020   PT End of Session - 12/14/20 1146    Visit Number 2    Date for PT Re-Evaluation 02/02/21    Authorization Type Cohere-authorization submitted    PT Start Time 1102    PT Stop Time 1153    PT Time Calculation (min) 51 min    Activity Tolerance Patient tolerated treatment well    Behavior During Therapy [facility] for tasks assessed/performed           Past Medical History:  Diagnosis Date  . Anemia   . Basal cell carcinoma 09/01/2002   right shin tx exc  . BCC (basal cell carcinoma of skin) 06/20/2013   right forearm TX WITH BX  . BCC (basal cell carcinoma of skin) 06/20/2013   right upper lip TX WITH BX  . BCC (basal cell carcinoma of skin) 10/03/2016   mid upper back [place]  . Colon polyps   . COPD (chronic obstructive pulmonary disease) ([place])   . Hyperlipidemia   . Mucoid cyst of joint   . SCC (squamous cell carcinoma) 01/23/2011   left forearm scc/ka  TX CX3 5FU CAUTERY  . SCC (squamous cell carcinoma) 10/03/2016   right shin scc/ka TX CX3 5FU CAUTERY  . SCC (squamous cell carcinoma) 07/29/2019   left forehead scc TX CX3 5FU CAUTERY  . Spinal stenosis   . Squamous cell carcinoma of skin 10/23/2005   left lower shin inf. bowens  TX CX3 5FU CAUTERY  . Varicose veins of lower extremities with other complications 0/26/3785    Past Surgical History:  Procedure Laterality Date  . ABDOMINAL HYSTERECTOMY    . MASS EXCISION  01/11/2012   Procedure: MINOR EXCISION OF MASS;  Surgeon: Cammie Sickle., MD;  Location: [facility];  Service: Orthopedics;  Laterality: Right;  excision mucoid cyst right index and dip joint  debridement    There were no vitals filed for this visit.   Subjective Assessment - 12/14/20 1103    Subjective I did the stretches. I thought it was better but then I felt it pushing the grocery cart around.    Pertinent History DDD and spinal stenosis of the lumbar spine, COPD    How long can you stand comfortably? limitations are due to chronic LBP    Patient Stated Goals reduce Lt hip pain with standing    Currently in Pain? No/denies                             [facility] Adult PT Treatment/Exercise - 12/14/20 0001      Exercises   Exercises Knee/Hip      Knee/Hip Exercises: Stretches   Hip Flexor Stretch Both;3 reps;20 seconds    Hip Flexor Stretch Limitations on step; also did seated for alternative when weather is bad    ITB Stretch Left;2 reps;60 seconds    Piriformis Stretch Left;1 rep;60 seconds    Piriformis Stretch Limitations seated    Other Knee/Hip Stretches butterfly 3x20 sec      Knee/Hip Exercises: Standing   Hip Flexion Both;10 reps    Hip Abduction Both;10 reps    Abduction Limitations  some pain in left hip with left flexion    Hip Extension Both;10 reps      Knee/Hip Exercises: Seated   Marching Both;5 reps    Marching Limitations easy      Modalities   Modalities Moist Heat      Moist Heat Therapy   Number Minutes Moist Heat 10 Minutes    Moist Heat Location Hip   upper leg adductors and quads     Manual Therapy   Manual Therapy Myofascial release;Soft tissue mobilization    Manual therapy comments Skilled palpation and monitoring of soft tissues during DN    Soft tissue mobilization to left lateral quads and ADDuctors    Myofascial Release to left ITB in [facility]            Trigger Point Dry Needling - 12/14/20 0001    Consent Given? Yes    Education Handout Provided Yes    Muscles Treated Lower Quadrant Adductor longus/brevis/magnus;Vastus lateralis    Dry Needling Comments left    Adductor Response Twitch response  elicited;Palpable increased muscle length    Vastus lateralis Response Twitch response elicited;Palpable increased muscle length                PT Education - 12/14/20 1144    Education Details DN education and aftercare; HEP progressed    Person(s) Educated Patient    Methods Explanation;Demonstration;Handout    Comprehension Verbalized understanding;Returned demonstration            PT Short Term Goals - 12/08/20 1148      PT SHORT TERM GOAL #1   Title independent with initial HEP    Time 4    Period Weeks    Status New    Target Date 01/05/21      PT SHORT TERM GOAL #2   Title report < or = to 4/10 Lt hip/LE pain when coming from sit to stand and getting out of the car    Time 4    Period Weeks    Status New    Target Date 01/05/21      PT SHORT TERM GOAL #3   Title report a 30% reduction in the frequency and intensity of Lt hip/leg pain with change of position and walking    Time 4    Period Weeks    Status New    Target Date 01/05/21             PT Long Term Goals - 12/08/20 1201      PT LONG TERM GOAL #1   Title Independent with HEP    Time 8    Period Weeks    Status New    Target Date 02/02/21      PT LONG TERM GOAL #2   Title report < or = to 2/10 Lt hip pain with sit to stand and getting out of the car    Baseline 6/10    Time 8    Period Weeks    Status New    Target Date 02/02/21      PT LONG TERM GOAL #3   Title perform 5x sit to stand in < or = to 13 seconds to reduce falls risk    Baseline --    Time 8    Period Weeks    Status New    Target Date 02/02/21      PT LONG TERM GOAL #4   Title report a 70% reduction in the frequency  and intensity of Lt hip pain with change of position and walking    Baseline --    Time 8    Period Weeks    Status New    Target Date 02/02/21      PT LONG TERM GOAL #5   Title demonstate 4+/5 Lt hip strength to improve stability and endurance with transitions and community activity    Baseline  --    Time 8    Period Weeks    Status New                 Plan - 12/14/20 1149    Clinical Impression Statement Patient reporting improvements already with intial HEP. HEP was reviewed and modified as needed. Patient reported doing hip flexor stretch on a wheeled foot stool at home due to rain. PT asked pt to no longer use anything with wheels due to safety hazard and showed optional seated HF stretch if she can't go outside. She had some groin pain with standing hip ABD but otherwise tolerated TE well. Initial trial of DN went well to left lateral quads/ITB and adductors.    Personal Factors and Comorbidities Comorbidity 2    Comorbidities lumbar stenosis, DDD and COPD    Examination-Activity Limitations Sit;Squat;Stand;Transfers    Examination-Participation Restrictions Driving;Community Activity    PT Frequency 2x / week    PT Duration 8 weeks    PT Treatment/Interventions ADLs/Self Care Home Management;Cryotherapy;Electrical Stimulation;Moist Heat;Iontophoresis 4mg /ml Dexamethasone;Stair training;Gait training;Functional mobility training;Therapeutic activities;Therapeutic exercise;Balance training;Neuromuscular re-education;Manual techniques;Patient/family education;Passive range of motion;Dry needling;Taping    PT Next Visit Plan Lt hip flexor mobility, hip flexibility, lumbopelvic strength, balance and endurance.  Dry needling to Lt hip flexors and adductors.    PT Home Exercise Plan Access Code: [email or web address]    Consulted and Agree with Plan of Care Patient           Patient will benefit from skilled therapeutic intervention in order to improve the following deficits and impairments:  Abnormal gait,Decreased activity tolerance,Decreased balance,Postural dysfunction,Decreased strength,Impaired flexibility,Improper body mechanics,Pain,Decreased endurance,Increased muscle spasms,Difficulty walking,Decreased range of motion  Visit Diagnosis: Pain in left hip  Other abnormalities  of gait and mobility  Muscle weakness (generalized)     Problem List Patient Active Problem List   Diagnosis Date Noted  . Coronary artery calcification 03/19/2018  . Ganglion of left wrist 03/07/2017  . Varicose veins of lower extremities with other complications 05/21/2012    05/23/2012 PT 12/14/2020, 1:08 PM  [facility] [address], [address] [place], [place], [place] Phone: [phone]   Fax:  [phone]  Name: Karen Bonilla MRN: [name] Date of Birth: 05/25/1941

## 2020-12-14 NOTE — Patient Instructions (Addendum)
Trigger Point Dry Needling  . What is Trigger Point Dry Needling (DN)? o DN is a physical therapy technique used to treat muscle pain and dysfunction. Specifically, DN helps deactivate muscle trigger points (muscle knots).  o A thin filiform needle is used to penetrate the skin and stimulate the underlying trigger point. The goal is for a local twitch response (LTR) to occur and for the trigger point to relax. No medication of any kind is injected during the procedure.   . What Does Trigger Point Dry Needling Feel Like?  o The procedure feels different for each individual patient. Some patients report that they do not actually feel the needle enter the skin and overall the process is not painful. Very mild bleeding may occur. However, many patients feel a deep cramping in the muscle in which the needle was inserted. This is the local twitch response.   Marland Kitchen How Will I feel after the treatment? o Soreness is normal, and the onset of soreness may not occur for a few hours. Typically this soreness does not last longer than two days.  o Bruising is uncommon, however; ice can be used to decrease any possible bruising.  o In rare cases feeling tired or nauseous after the treatment is normal. In addition, your symptoms may get worse before they get better, this period will typically not last longer than 24 hours.   . What Can I do After My Treatment? o Increase your hydration by drinking more water for the next 24 hours. o You may place ice or heat on the areas treated that have become sore, however, do not use heat on inflamed or bruised areas. Heat often brings more relief post needling. o You can continue your regular activities, but vigorous activity is not recommended initially after the treatment for 24 hours. o DN is best combined with other physical therapy such as strengthening, stretching, and other therapies.   Access Code: [email or web address] URL: [email or web address] Date:  12/14/2020 Prepared by: Almyra Free  Exercises Supine Butterfly Groin Stretch - 2 x daily - 7 x weekly - 1 sets - 5 reps - 10-20 hold Seated Figure 4 Piriformis Stretch - 1 x daily - 7 x weekly - 3 sets - 10 reps Hip Flexor Stretch on Step - 2 x daily - 7 x weekly - 1 sets - 3 reps - 20 hold Sidelying ITB Stretch off Table - 2 x daily - 7 x weekly - 1 sets - 3 reps - 60 seconds hold Standing March with Counter Support - 1 x daily - 7 x weekly - 3 sets - 10 reps

## 2020-12-29 ENCOUNTER — Other Ambulatory Visit: Payer: Self-pay

## 2020-12-29 ENCOUNTER — Ambulatory Visit: Payer: Medicare PPO | Attending: Family Medicine | Admitting: Physical Therapy

## 2020-12-29 ENCOUNTER — Encounter: Payer: Self-pay | Admitting: Physical Therapy

## 2020-12-29 DIAGNOSIS — M25552 Pain in left hip: Secondary | ICD-10-CM | POA: Diagnosis not present

## 2020-12-29 DIAGNOSIS — M6281 Muscle weakness (generalized): Secondary | ICD-10-CM | POA: Diagnosis not present

## 2020-12-29 DIAGNOSIS — R2689 Other abnormalities of gait and mobility: Secondary | ICD-10-CM | POA: Diagnosis not present

## 2020-12-29 NOTE — Patient Instructions (Signed)
Access Code: [email or web address] URL: [email or web address] Date: 12/29/2020 Prepared by: Raynelle Fanning  Exercises Supine Butterfly Groin Stretch - 2 x daily - 7 x weekly - 1 sets - 5 reps - 10-20 hold Seated Figure 4 Piriformis Stretch - 1 x daily - 7 x weekly - 3 sets - 10 reps Hip Flexor Stretch on Step - 2 x daily - 7 x weekly - 1 sets - 3 reps - 20 hold Sidelying ITB Stretch off Table - 2 x daily - 7 x weekly - 1 sets - 3 reps - 60 seconds hold Standing March with Counter Support - 1 x daily - 7 x weekly - 3 sets - 10 reps Standing Hip Abduction with Counter Support - 1 x daily - 7 x weekly - 3 sets - 10 reps Standing Hip Extension with Counter Support - 1 x daily - 7 x weekly - 3 sets - 10 reps Seated Hip Internal Rotation AROM - 1 x daily - 7 x weekly - 1-3 sets - 10 reps

## 2020-12-29 NOTE — Therapy (Signed)
[facility] [address], [address] [place], [place], [address] Phone: [phone]   Fax:  [phone]  Physical Therapy Treatment  Patient Details  Name: Karen Bonilla MRN: [id number] Date of Birth: 03-Feb-1941 Referring Provider (PT): Juluis Rainier, MD   Encounter Date: 12/29/2020   PT End of Session - 12/29/20 1105    Visit Number 3    Date for PT Re-Evaluation 02/02/21    Authorization Type Cohere-authorization submitted    PT Start Time 1105    PT Stop Time 1153    PT Time Calculation (min) 48 min    Activity Tolerance Patient tolerated treatment well    Behavior During Therapy [facility] for tasks assessed/performed           Past Medical History:  Diagnosis Date  . Anemia   . Basal cell carcinoma 09/01/2002   right shin tx exc  . BCC (basal cell carcinoma of skin) 06/20/2013   right forearm TX WITH BX  . BCC (basal cell carcinoma of skin) 06/20/2013   right upper lip TX WITH BX  . BCC (basal cell carcinoma of skin) 10/03/2016   mid upper back TX WITH BX  . Colon polyps   . COPD (chronic obstructive pulmonary disease) (HCC)   . Hyperlipidemia   . Mucoid cyst of joint   . SCC (squamous cell carcinoma) 01/23/2011   left forearm scc/ka  TX CX3 5FU CAUTERY  . SCC (squamous cell carcinoma) 10/03/2016   right shin scc/ka TX CX3 5FU CAUTERY  . SCC (squamous cell carcinoma) 07/29/2019   left forehead scc TX CX3 5FU CAUTERY  . Spinal stenosis   . Squamous cell carcinoma of skin 10/23/2005   left lower shin inf. bowens  TX CX3 5FU CAUTERY  . Varicose veins of lower extremities with other complications 05/21/2012    Past Surgical History:  Procedure Laterality Date  . ABDOMINAL HYSTERECTOMY    . MASS EXCISION  01/11/2012   Procedure: MINOR EXCISION OF MASS;  Surgeon: Wyn Forster., MD;  Location: [facility];  Service: Orthopedics;  Laterality: Right;  excision mucoid cyst right index and dip joint  debridement    There were no vitals filed for this visit.   Subjective Assessment - 12/29/20 1105    Subjective Pain in groin remains, but the needling was great and the pain in the hip and lateral leg are gone. Still having some pain coming down the back of the leg.    Pertinent History DDD and spinal stenosis of the lumbar spine, COPD    Patient Stated Goals reduce Lt hip pain with standing    Currently in Pain? Yes    Pain Score 7     Pain Location Groin    Pain Orientation Left    Pain Descriptors / Indicators Sharp    Pain Type Chronic pain                             [facility] Adult PT Treatment/Exercise - 12/29/20 0001      Knee/Hip Exercises: Stretches   Hip Flexor Stretch Both;3 reps;20 seconds    Hip Flexor Stretch Limitations on step    ITB Stretch Left;1 rep;30 seconds    ITB Stretch Limitations SDLY    Piriformis Stretch Left;1 rep;60 seconds    Piriformis Stretch Limitations seated    Other Knee/Hip Stretches butterfly 3x20 sec      Knee/Hip Exercises:  Standing   Hip Flexion Both;10 reps    Hip Abduction Both;10 reps    Abduction Limitations cues to keep toes forward    Hip Extension Both;10 reps      Knee/Hip Exercises: Seated   Other Seated Knee/Hip Exercises bil hip IR x 10 for dynamic stretch of external rotators      Knee/Hip Exercises: Supine   Bridges with Ball Squeeze 10 reps    Other Supine Knee/Hip Exercises hip ADD squeeze ball 5 sec x 10      Knee/Hip Exercises: Sidelying   Clams x10; then with green loop x 10 bil      Manual Therapy   Manual Therapy Manual Traction;Passive ROM    Passive ROM into left hip IR prolonged holds    Manual Traction Left long leg distraction and hip distraction with belt                  PT Education - 12/29/20 1159    Education Details HEP    Person(s) Educated Patient    Methods Explanation;Demonstration;Handout    Comprehension Verbalized understanding;Returned demonstration             PT Short Term Goals - 12/29/20 1155      PT SHORT TERM GOAL #1   Title independent with initial HEP    Status Achieved      PT SHORT TERM GOAL #2   Title report < or = to 4/10 Lt hip/LE pain when coming from sit to stand and getting out of the car    Baseline car is going well; still feels with getting up from desk chair    Status On-going      PT George #3   Title report a 30% reduction in the frequency and intensity of Lt hip/leg pain with change of position and walking    Baseline able to turn over in bed without pain    Status Partially Met             PT Long Term Goals - 12/08/20 1201      PT LONG TERM GOAL #1   Title Independent with HEP    Time 8    Period Weeks    Status New    Target Date 02/02/21      PT LONG TERM GOAL #2   Title report < or = to 2/10 Lt hip pain with sit to stand and getting out of the car    Baseline 6/10    Time 8    Period Weeks    Status New    Target Date 02/02/21      PT LONG TERM GOAL #3   Title perform 5x sit to stand in < or = to 13 seconds to reduce falls risk    Baseline --    Time 8    Period Weeks    Status New    Target Date 02/02/21      PT LONG TERM GOAL #4   Title report a 70% reduction in the frequency and intensity of Lt hip pain with change of position and walking    Baseline --    Time 8    Period Weeks    Status New    Target Date 02/02/21      PT LONG TERM GOAL #5   Title demonstate 4+/5 Lt hip strength to improve stability and endurance with transitions and community activity    Baseline --  Time 8    Period Weeks    Status New                 Plan - 12/29/20 1159    Clinical Impression Statement Patient is progressing with goals. She is able to get out of the car and move in bed without pain, but continues to have sharp groin pain when getting up from desk chair and with IR of hip. She had great relief with long leg distraction today and was able to tolerate IR in supine  better after distraction. She reports that DN abolished all anterior/lateral hip pain.    Personal Factors and Comorbidities Comorbidity 2    Comorbidities lumbar stenosis, DDD and COPD    Examination-Activity Limitations Sit;Squat;Stand;Transfers    PT Frequency 2x / week    PT Duration 8 weeks    PT Treatment/Interventions ADLs/Self Care Home Management;Cryotherapy;Electrical Stimulation;Moist Heat;Iontophoresis 4mg /ml Dexamethasone;Stair training;Gait training;Functional mobility training;Therapeutic activities;Therapeutic exercise;Balance training;Neuromuscular re-education;Manual techniques;Patient/family education;Passive range of motion;Dry needling;Taping    PT Next Visit Plan Keep working on left hip IR; Lt hip flexor mobility, hip flexibility, lumbopelvic strength, balance and endurance.    PT Home Exercise Plan Access Code: [email or web address]    Consulted and Agree with Plan of Care Patient           Patient will benefit from skilled therapeutic intervention in order to improve the following deficits and impairments:  Abnormal gait,Decreased activity tolerance,Decreased balance,Postural dysfunction,Decreased strength,Impaired flexibility,Improper body mechanics,Pain,Decreased endurance,Increased muscle spasms,Difficulty walking,Decreased range of motion  Visit Diagnosis: Pain in left hip  Other abnormalities of gait and mobility  Muscle weakness (generalized)     Problem List Patient Active Problem List   Diagnosis Date Noted  . Coronary artery calcification 03/19/2018  . Ganglion of left wrist 03/07/2017  . Varicose veins of lower extremities with other complications 27/02/5008    Madelyn Flavors PT 12/29/2020, 12:06 PM  [facility] [address] [place], [place], [address] Phone: [phone]   Fax:  [phone]  Name: LEOTHA WESTERMEYER MRN: [id number] Date of Birth: [birth year]

## 2020-12-30 ENCOUNTER — Encounter: Payer: Medicare PPO | Admitting: Dermatology

## 2021-01-03 ENCOUNTER — Ambulatory Visit: Payer: Medicare PPO

## 2021-01-03 ENCOUNTER — Other Ambulatory Visit: Payer: Self-pay

## 2021-01-03 DIAGNOSIS — R2689 Other abnormalities of gait and mobility: Secondary | ICD-10-CM

## 2021-01-03 DIAGNOSIS — M25552 Pain in left hip: Secondary | ICD-10-CM

## 2021-01-03 DIAGNOSIS — M6281 Muscle weakness (generalized): Secondary | ICD-10-CM

## 2021-01-03 NOTE — Therapy (Signed)
[facility] [address] [place], [place], [address] Phone: [phone]   Fax:  [phone]  Physical Therapy Treatment  Patient Details  Name: Karen Bonilla MRN: [id number] Date of Birth: 04/11/1941 Referring Provider (PT): Juluis Rainier, MD   Encounter Date: 01/03/2021   PT End of Session - 01/03/21 1657    Visit Number 4    Date for PT Re-Evaluation 02/02/21    Authorization Type Cohere: 12 visits 12/15-01/26/21    Authorization - Visit Number 4    Authorization - Number of Visits 12    Progress Note Due on Visit 10    PT Start Time 1617    PT Stop Time 1658    PT Time Calculation (min) 41 min    Activity Tolerance Patient tolerated treatment well    Behavior During Therapy [facility] for tasks assessed/performed           Past Medical History:  Diagnosis Date  . Anemia   . Basal cell carcinoma 09/01/2002   right shin tx exc  . BCC (basal cell carcinoma of skin) 06/20/2013   right forearm TX WITH BX  . BCC (basal cell carcinoma of skin) 06/20/2013   right upper lip TX WITH BX  . BCC (basal cell carcinoma of skin) 10/03/2016   mid upper back TX WITH BX  . Colon polyps   . COPD (chronic obstructive pulmonary disease) (HCC)   . Hyperlipidemia   . Mucoid cyst of joint   . SCC (squamous cell carcinoma) 01/23/2011   left forearm scc/ka  TX CX3 5FU CAUTERY  . SCC (squamous cell carcinoma) 10/03/2016   right shin scc/ka TX CX3 5FU CAUTERY  . SCC (squamous cell carcinoma) 07/29/2019   left forehead scc TX CX3 5FU CAUTERY  . Spinal stenosis   . Squamous cell carcinoma of skin 10/23/2005   left lower shin inf. bowens  TX CX3 5FU CAUTERY  . Varicose veins of lower extremities with other complications 05/21/2012    Past Surgical History:  Procedure Laterality Date  . ABDOMINAL HYSTERECTOMY    . MASS EXCISION  01/11/2012   Procedure: MINOR EXCISION OF MASS;  Surgeon: Wyn Forster., MD;  Location: [facility]  ;  Service: Orthopedics;  Laterality: Right;  excision mucoid cyst right index and dip joint debridement    There were no vitals filed for this visit.   Subjective Assessment - 01/03/21 1620    Subjective I am not getting pain in the Lt groin as often.  I feel 30% better overall.    Currently in Pain? Yes    Pain Location Groin    Pain Orientation Left    Pain Descriptors / Indicators Sharp    Pain Type Chronic pain    Pain Onset More than a month ago    Pain Frequency Intermittent    Aggravating Factors  getting up from sitting, turning on Lt LE    Pain Relieving Factors change of position                             [facility] Adult PT Treatment/Exercise - 01/03/21 0001      Knee/Hip Exercises: Stretches   Hip Flexor Stretch Both;3 reps;20 seconds    Hip Flexor Stretch Limitations on step    Piriformis Stretch Limitations --    Other Knee/Hip Stretches butterfly 3x20 sec      Knee/Hip Exercises: Standing  Hip Flexion Both;10 reps    Hip Abduction Both;10 reps    Abduction Limitations cues to keep toes forward    Hip Extension Both;10 reps      Knee/Hip Exercises: Seated   Sit to Sand 2 sets;10 reps   on black pad     Knee/Hip Exercises: Supine   Bridges with Ball Squeeze 10 reps    Other Supine Knee/Hip Exercises hip ADD squeeze ball 5 sec x 10      Knee/Hip Exercises: Sidelying   Clams x10; then with green loop x 10 bil      Manual Therapy   Manual Therapy Manual Traction;Passive ROM    Passive ROM into left hip IR prolonged holds    Manual Traction Left long leg distraction and hip distraction                  PT Education - 01/03/21 1638    Education Details Access Code: [email or web address]    Person(s) Educated Patient    Methods Explanation;Demonstration;Handout    Comprehension Verbalized understanding;Returned demonstration            PT Short Term Goals - 01/03/21 1622      PT SHORT TERM GOAL #1   Title independent  with initial HEP    Status Achieved      PT SHORT TERM GOAL #2   Title report < or = to 4/10 Lt hip/LE pain when coming from sit to stand and getting out of the car    Baseline 4/10 max    Status Achieved      PT SHORT TERM GOAL #3   Title report a 30% reduction in the frequency and intensity of Lt hip/leg pain with change of position and walking    Status Achieved             PT Long Term Goals - 12/08/20 1201      PT LONG TERM GOAL #1   Title Independent with HEP    Time 8    Period Weeks    Status New    Target Date 02/02/21      PT LONG TERM GOAL #2   Title report < or = to 2/10 Lt hip pain with sit to stand and getting out of the car    Baseline 6/10    Time 8    Period Weeks    Status New    Target Date 02/02/21      PT LONG TERM GOAL #3   Title perform 5x sit to stand in < or = to 13 seconds to reduce falls risk    Baseline --    Time 8    Period Weeks    Status New    Target Date 02/02/21      PT LONG TERM GOAL #4   Title report a 70% reduction in the frequency and intensity of Lt hip pain with change of position and walking    Baseline --    Time 8    Period Weeks    Status New    Target Date 02/02/21      PT LONG TERM GOAL #5   Title demonstate 4+/5 Lt hip strength to improve stability and endurance with transitions and community activity    Baseline --    Time 8    Period Weeks    Status New                 Plan - 01/03/21  [place]    Clinical Impression Statement Pt reports 30% overall reduction in Lt groin pian and max of 4/10 Lt groin pain with getting in/out of the car and with pivoting on the Lt hip.  Pt has good response with long leg distraction today and was able to tolerate IR better as well. Pt expressed fear of falling and PT added sit to stand to HEP to address balance deficits and highlighted how standing hip exercises will also help with this.  Pt required verbal and demo cueing for technique with sit to stand transition.  Pt  will continue to benefit from skilled PT to address Lt groin pain, posture, balance and strength.    Examination-Participation Restrictions Driving;Community Activity    PT Frequency 2x / week    PT Duration 8 weeks    PT Treatment/Interventions ADLs/Self Care Home Management;Cryotherapy;Electrical Stimulation;Moist Heat;Iontophoresis 4mg /ml Dexamethasone;Stair training;Gait training;Functional mobility training;Therapeutic activities;Therapeutic exercise;Balance training;Neuromuscular re-education;Manual techniques;Patient/family education;Passive range of motion;Dry needling;Taping    PT Next Visit Plan Keep working on left hip IR; Lt hip flexor mobility, hip flexibility, lumbopelvic strength, balance and endurance.    PT Home Exercise Plan Access Code: [email or web address]    Recommended Other Services initial cert is signed.    Consulted and Agree with Plan of Care Patient           Patient will benefit from skilled therapeutic intervention in order to improve the following deficits and impairments:  Abnormal gait,Decreased activity tolerance,Decreased balance,Postural dysfunction,Decreased strength,Impaired flexibility,Improper body mechanics,Pain,Decreased endurance,Increased muscle spasms,Difficulty walking,Decreased range of motion  Visit Diagnosis: Pain in left hip  Other abnormalities of gait and mobility  Muscle weakness (generalized)     Problem List Patient Active Problem List   Diagnosis Date Noted  . Coronary artery calcification 03/19/2018  . Ganglion of left wrist 03/07/2017  . Varicose veins of lower extremities with other complications 94/76/5465    Sigurd Sos, PT 01/03/21 5:00 PM  [facility] [address], [place], [place], [address] Phone: [phone]   Fax:  [phone]  Name: KEIR FOLAND MRN: [id number] Date of Birth: 1941/09/29

## 2021-01-03 NOTE — Patient Instructions (Signed)
Access Code: [email or web address] URL: [email or web address] Date: 01/03/2021 Prepared by: Claiborne Billings  Exercises  Sit to Stand - 2 x daily - 7 x weekly - 2 sets - 10 reps

## 2021-01-05 ENCOUNTER — Ambulatory Visit: Payer: Medicare PPO | Admitting: Physical Therapy

## 2021-01-05 ENCOUNTER — Encounter: Payer: Self-pay | Admitting: Physical Therapy

## 2021-01-05 ENCOUNTER — Other Ambulatory Visit: Payer: Self-pay

## 2021-01-05 DIAGNOSIS — M6281 Muscle weakness (generalized): Secondary | ICD-10-CM | POA: Diagnosis not present

## 2021-01-05 DIAGNOSIS — R2689 Other abnormalities of gait and mobility: Secondary | ICD-10-CM | POA: Diagnosis not present

## 2021-01-05 DIAGNOSIS — M25552 Pain in left hip: Secondary | ICD-10-CM

## 2021-01-05 NOTE — Therapy (Signed)
[facility] [address], [place], [place], [address] Phone: [phone]   Fax:  [phone]  Physical Therapy Treatment  Patient Details  Name: Karen Bonilla MRN: [id number] Date of Birth: May 23, 1941 Referring Provider (PT): Leighton Ruff, MD   Encounter Date: 01/05/2021   PT End of Session - 01/05/21 1146    Visit Number 5    Date for PT Re-Evaluation 02/02/21    Authorization Type Cohere: 12 visits 12/15-01/26/21    Authorization - Visit Number 5    Authorization - Number of Visits 12    Progress Note Due on Visit 10    PT Start Time 1146    PT Stop Time 1236    PT Time Calculation (min) 50 min    Activity Tolerance Patient tolerated treatment well    Behavior During Therapy [facility] for tasks assessed/performed           Past Medical History:  Diagnosis Date  . Anemia   . Basal cell carcinoma 09/01/2002   right shin tx exc  . BCC (basal cell carcinoma of skin) 06/20/2013   right forearm TX WITH BX  . BCC (basal cell carcinoma of skin) 06/20/2013   right upper lip TX WITH BX  . BCC (basal cell carcinoma of skin) 10/03/2016   mid upper back [place]  . Colon polyps   . COPD (chronic obstructive pulmonary disease) ([place])   . Hyperlipidemia   . Mucoid cyst of joint   . SCC (squamous cell carcinoma) 01/23/2011   left forearm scc/ka  TX CX3 5FU CAUTERY  . SCC (squamous cell carcinoma) 10/03/2016   right shin scc/ka TX CX3 5FU CAUTERY  . SCC (squamous cell carcinoma) 07/29/2019   left forehead scc TX CX3 5FU CAUTERY  . Spinal stenosis   . Squamous cell carcinoma of skin 10/23/2005   left lower shin inf. bowens  TX CX3 5FU CAUTERY  . Varicose veins of lower extremities with other complications 0000000    Past Surgical History:  Procedure Laterality Date  . ABDOMINAL HYSTERECTOMY    . MASS EXCISION  01/11/2012   Procedure: MINOR EXCISION OF MASS;  Surgeon: Cammie Sickle., MD;  Location: [facility];  Service: Orthopedics;  Laterality: Right;  excision mucoid cyst right index and dip joint debridement    There were no vitals filed for this visit.   Subjective Assessment - 01/05/21 1149    Subjective acute pain in groin is still better, but her leg and back are really hurting after water aerobics this morning. She incorporated her HEP into her aerobics program.    Pertinent History DDD and spinal stenosis of the lumbar spine, COPD    How long can you stand comfortably? limitations are due to chronic LBP    Diagnostic tests none    Patient Stated Goals reduce Lt hip pain with standing    Currently in Pain? Yes    Pain Score 4    denies groin pain today   Pain Location Back    Pain Orientation Lower    Pain Descriptors / Indicators Aching    Pain Type Chronic pain    Pain Radiating Towards into post left leg to knee                             [facility] Adult PT Treatment/Exercise - 01/05/21 0001      Knee/Hip Exercises: Stretches  Hip Flexor Stretch Left;2 reps;30 seconds   right 5x 20 sec   Hip Flexor Stretch Limitations on step    Piriformis Stretch Left;2 reps;60 seconds    Piriformis Stretch Limitations seated    Other Knee/Hip Stretches bil hip Adductor stretch standing in wide stance, hands on mat and thoracic rotation x 5 ea    Other Knee/Hip Stretches prone bil hip IR/ER x 10 then prolonged hold      Knee/Hip Exercises: Aerobic   Nustep L2 x 5 min      Knee/Hip Exercises: Standing   Other Standing Knee Exercises on noodle: scap retraction, L's, Ws x 10 bil    Other Standing Knee Exercises left lateral shift x 10 followed by patient trying to equalize standing posture      Knee/Hip Exercises: Seated   Other Seated Knee/Hip Exercises bil hip IR x 20 for dynamic stretch of external rotators   inital pain in knee but moves to stretch of hip after a few reps     Manual Therapy   Manual Therapy Manual Traction;Passive ROM;Soft tissue  mobilization    Soft tissue mobilization to left quadriceps    Passive ROM into left hip IR prolonged holds in prone; supine IR with hip flexion was too painful today    Manual Traction Left long leg distraction and hip distraction                    PT Short Term Goals - 01/03/21 1622      PT SHORT TERM GOAL #1   Title independent with initial HEP    Status Achieved      PT SHORT TERM GOAL #2   Title report < or = to 4/10 Lt hip/LE pain when coming from sit to stand and getting out of the car    Baseline 4/10 max    Status Achieved      PT SHORT TERM GOAL #3   Title report a 30% reduction in the frequency and intensity of Lt hip/leg pain with change of position and walking    Status Achieved             PT Long Term Goals - 12/08/20 1201      PT LONG TERM GOAL #1   Title Independent with HEP    Time 8    Period Weeks    Status New    Target Date 02/02/21      PT LONG TERM GOAL #2   Title report < or = to 2/10 Lt hip pain with sit to stand and getting out of the car    Baseline 6/10    Time 8    Period Weeks    Status New    Target Date 02/02/21      PT LONG TERM GOAL #3   Title perform 5x sit to stand in < or = to 13 seconds to reduce falls risk    Baseline --    Time 8    Period Weeks    Status New    Target Date 02/02/21      PT LONG TERM GOAL #4   Title report a 70% reduction in the frequency and intensity of Lt hip pain with change of position and walking    Baseline --    Time 8    Period Weeks    Status New    Target Date 02/02/21      PT LONG TERM GOAL #5  Title demonstate 4+/5 Lt hip strength to improve stability and endurance with transitions and community activity    Baseline --    Time 8    Period Weeks    Status New                 Plan - 01/05/21 1240    Clinical Impression Statement Patient with increased pain in low back, post left leg and ant thigh today after doing water aerobics this morning. We worked on  postural exercises initially as pt reported doing her HEP in the water. Prolonged holds encouraged for hip flexor stretch on stairs. She had increased tone in right distal quads today which responded well to manual therapy.    PT Treatment/Interventions ADLs/Self Care Home Management;Cryotherapy;Electrical Stimulation;Moist Heat;Iontophoresis 4mg /ml Dexamethasone;Stair training;Gait training;Functional mobility training;Therapeutic activities;Therapeutic exercise;Balance training;Neuromuscular re-education;Manual techniques;Patient/family education;Passive range of motion;Dry needling;Taping    PT Next Visit Plan Keep working on left hip IR; Lt hip flexor mobility, hip flexibility, lumbopelvic strength, balance and endurance.    PT Home Exercise Plan Access Code: [email or web address]           Patient will benefit from skilled therapeutic intervention in order to improve the following deficits and impairments:  Abnormal gait,Decreased activity tolerance,Decreased balance,Postural dysfunction,Decreased strength,Impaired flexibility,Improper body mechanics,Pain,Decreased endurance,Increased muscle spasms,Difficulty walking,Decreased range of motion  Visit Diagnosis: Pain in left hip  Other abnormalities of gait and mobility  Muscle weakness (generalized)     Problem List Patient Active Problem List   Diagnosis Date Noted  . Coronary artery calcification 03/19/2018  . Ganglion of left wrist 03/07/2017  . Varicose veins of lower extremities with other complications 68/34/1962    Madelyn Flavors PT 01/05/2021, 12:45 PM   [facility] [address] [place], [place], [address] Phone: [phone]   Fax:  [phone]  Name: AREANNA GENGLER MRN: [id number] Date of Birth: [birth year]

## 2021-01-07 ENCOUNTER — Other Ambulatory Visit: Payer: Self-pay

## 2021-01-07 ENCOUNTER — Ambulatory Visit (INDEPENDENT_AMBULATORY_CARE_PROVIDER_SITE_OTHER): Payer: Medicare PPO | Admitting: Dermatology

## 2021-01-07 ENCOUNTER — Encounter: Payer: Self-pay | Admitting: Dermatology

## 2021-01-07 ENCOUNTER — Encounter: Payer: Medicare PPO | Admitting: Dermatology

## 2021-01-07 DIAGNOSIS — D099 Carcinoma in situ, unspecified: Secondary | ICD-10-CM

## 2021-01-07 DIAGNOSIS — D0471 Carcinoma in situ of skin of right lower limb, including hip: Secondary | ICD-10-CM | POA: Diagnosis not present

## 2021-01-07 MED ORDER — MUPIROCIN 2 % EX OINT
1.0000 | TOPICAL_OINTMENT | Freq: Two times a day (BID) | CUTANEOUS | 0 refills | Status: DC
Start: 2021-01-07 — End: 2021-12-29

## 2021-01-07 NOTE — Patient Instructions (Signed)

## 2021-01-11 ENCOUNTER — Encounter: Payer: Self-pay | Admitting: Dermatology

## 2021-01-11 ENCOUNTER — Encounter: Payer: Medicare PPO | Admitting: Physical Therapy

## 2021-01-11 NOTE — Progress Notes (Signed)
   Follow-Up Visit   Subjective  Karen Bonilla is a 80 y.o. female who presents for the following: Procedure.  CIS x2 Location: Leg Duration:  Quality:  Associated Signs/Symptoms: Modifying Factors:  Severity:  Timing: Context: For treatment  Objective  Well appearing patient in no apparent distress; mood and affect are within normal limits. Objective  Right Lower Leg - Anterior upper: Biopsy site identified by patient and me.  Told preoperatively that this is difficult area for healing with risks of treatment failure, wound infection, scar, and discoloration.  Objective  Right Lower Leg - Posterior lower: Biopsy site identified by patient and me    A focused examination was performed including Head, neck, arms, legs.. Relevant physical exam findings are noted in the Assessment and Plan.   Assessment & Plan    Squamous cell carcinoma in situ (2) Right Lower Leg - Anterior upper  Destruction of lesion Complexity: simple   Destruction method: cryotherapy   Informed consent: discussed and consent obtained   Timeout:  patient name, date of birth, surgical site, and procedure verified Lesion destroyed using liquid nitrogen: Yes   Cryotherapy cycles:  3 Lesion length (cm):  1.4 Margin per side (cm):  0 Final wound size (cm):  1.4 Outcome: patient tolerated procedure well with no complications   Post-procedure details: wound care instructions given    Right Lower Leg - Posterior lower  Destruction of lesion Complexity: simple   Destruction method: electrodesiccation and curettage   Informed consent: discussed and consent obtained   Timeout:  patient name, date of birth, surgical site, and procedure verified Anesthesia: the lesion was anesthetized in a standard fashion   Anesthetic:  1% lidocaine w/ epinephrine 1-100,000 local infiltration Curettage performed in three different directions: Yes   Curettage cycles:  3 Lesion length (cm):  1.3 Lesion width (cm):   1.3 Margin per side (cm):  0 Final wound size (cm):  1.3 Hemostasis achieved with:  ferric subsulfate Outcome: patient tolerated procedure well with no complications   Additional details:  Wound innoculated with 5 fluorouracil solution.      I, Lavonna Monarch, MD, have reviewed all documentation for this visit.  The documentation on 01/11/21 for the exam, diagnosis, procedures, and orders are all accurate and complete.

## 2021-01-13 ENCOUNTER — Other Ambulatory Visit: Payer: Self-pay

## 2021-01-13 ENCOUNTER — Ambulatory Visit: Payer: Medicare PPO

## 2021-01-13 DIAGNOSIS — R2689 Other abnormalities of gait and mobility: Secondary | ICD-10-CM

## 2021-01-13 DIAGNOSIS — M25552 Pain in left hip: Secondary | ICD-10-CM | POA: Diagnosis not present

## 2021-01-13 DIAGNOSIS — M6281 Muscle weakness (generalized): Secondary | ICD-10-CM | POA: Diagnosis not present

## 2021-01-13 NOTE — Therapy (Signed)
[facility] [address], [place], [place], [address] Phone: [phone]   Fax:  [phone]  Physical Therapy Treatment  Patient Details  Name: Karen Bonilla MRN: [id number] Date of Birth: 04-26-1941 Referring Provider (PT): Leighton Ruff, MD   Encounter Date: 01/13/2021   PT End of Session - 01/13/21 1227    Visit Number 6    Date for PT Re-Evaluation 02/02/21    Authorization Type Cohere: 12 visits 12/15-01/26/21    Authorization - Visit Number 6    Authorization - Number of Visits 12    Progress Note Due on Visit 10    PT Start Time [id number]    PT Stop Time 1228    PT Time Calculation (min) 39 min    Activity Tolerance Patient tolerated treatment well    Behavior During Therapy [facility] for tasks assessed/performed           Past Medical History:  Diagnosis Date  . Anemia   . Basal cell carcinoma 09/01/2002   right shin tx exc  . BCC (basal cell carcinoma of skin) 06/20/2013   right forearm TX WITH BX  . BCC (basal cell carcinoma of skin) 06/20/2013   right upper lip TX WITH BX  . BCC (basal cell carcinoma of skin) 10/03/2016   mid upper back [place]  . Colon polyps   . COPD (chronic obstructive pulmonary disease) ([place])   . Hyperlipidemia   . Mucoid cyst of joint   . SCC (squamous cell carcinoma) 01/23/2011   left forearm scc/ka  TX CX3 5FU CAUTERY  . SCC (squamous cell carcinoma) 10/03/2016   right shin scc/ka TX CX3 5FU CAUTERY  . SCC (squamous cell carcinoma) 07/29/2019   left forehead scc TX CX3 5FU CAUTERY  . Spinal stenosis   . Squamous cell carcinoma of skin 10/23/2005   left lower shin inf. bowens  TX CX3 5FU CAUTERY  . Squamous cell carcinoma of skin 08/06/2020   in situ- right lower leg (CX35FU)  . Squamous cell carcinoma of skin 08/06/2020   in situ- right lower leg- posterior lower (CX35FU)  . Varicose veins of lower extremities with other complications 0000000    Past Surgical  History:  Procedure Laterality Date  . ABDOMINAL HYSTERECTOMY    . MASS EXCISION  01/11/2012   Procedure: MINOR EXCISION OF MASS;  Surgeon: Cammie Sickle., MD;  Location: [place];  Service: Orthopedics;  Laterality: Right;  excision mucoid cyst right index and dip joint debridement    There were no vitals filed for this visit.   Subjective Assessment - 01/13/21 1153    Subjective I went to water aerobics yesterday for the first time in a week and i am stiff and sore.    Currently in Pain? Yes    Pain Location Groin    Pain Orientation Left    Pain Type Chronic pain    Pain Onset More than a month ago    Pain Frequency Intermittent    Aggravating Factors  morning hours, getting up from sitting, turning on Lt LE    Pain Relieving Factors change of position, stretching                             [facility] Adult PT Treatment/Exercise - 01/13/21 0001      Knee/Hip Exercises: Stretches   Hip Flexor Stretch Left;2 reps;30 seconds   right 5x  20 sec   Hip Flexor Stretch Limitations on step      Knee/Hip Exercises: Aerobic   Nustep L2 x 6 min- PT present to discuss progress      Knee/Hip Exercises: Standing   Hip Abduction Both;10 reps;2 sets    Abduction Limitations standing on black pad    Hip Extension Both;10 reps;2 sets    Extension Limitations standing on black pad      Knee/Hip Exercises: Seated   Sit to Sand 2 sets;10 reps   fatigue and uncontrolled stand to sit     Knee/Hip Exercises: Supine   Bridges with [employer] 10 reps;2 sets      Manual Therapy   Soft tissue mobilization Lt lt hip adductors    Passive ROM Lt hip ER and IR with prolonged holds as tolerated                    PT Short Term Goals - 01/03/21 1622      PT SHORT TERM GOAL #1   Title independent with initial HEP    Status Achieved      PT SHORT TERM GOAL #2   Title report < or = to 4/10 Lt hip/LE pain when coming from sit to stand and getting out  of the car    Baseline 4/10 max    Status Achieved      PT SHORT TERM GOAL #3   Title report a 30% reduction in the frequency and intensity of Lt hip/leg pain with change of position and walking    Status Achieved             PT Long Term Goals - 12/08/20 1201      PT LONG TERM GOAL #1   Title Independent with HEP    Time 8    Period Weeks    Status New    Target Date 02/02/21      PT LONG TERM GOAL #2   Title report < or = to 2/10 Lt hip pain with sit to stand and getting out of the car    Baseline 6/10    Time 8    Period Weeks    Status New    Target Date 02/02/21      PT LONG TERM GOAL #3   Title perform 5x sit to stand in < or = to 13 seconds to reduce falls risk    Baseline --    Time 8    Period Weeks    Status New    Target Date 02/02/21      PT LONG TERM GOAL #4   Title report a 70% reduction in the frequency and intensity of Lt hip pain with change of position and walking    Baseline --    Time 8    Period Weeks    Status New    Target Date 02/02/21      PT LONG TERM GOAL #5   Title demonstate 4+/5 Lt hip strength to improve stability and endurance with transitions and community activity    Baseline --    Time 8    Period Weeks    Status New                 Plan - 01/13/21 1207    Clinical Impression Statement Pt reports 50% overall improvement in Lt groin pain since the start of care 3/10  Lt groin pain with getting in/out of the car and  with pivoting on the Lt hip.  Pt has good response with long leg distraction today and was able to tolerate IR better as well. Pt did well with progression of standing exercise on foam pad to address strength and balance.   Pt required verbal and demo cueing for technique with sit to stand transition.  Pt will continue to benefit from skilled PT to address Lt groin pain, posture, balance and strength.    Rehab Potential Good    PT Frequency 2x / week    PT Duration 8 weeks    PT Treatment/Interventions  ADLs/Self Care Home Management;Cryotherapy;Electrical Stimulation;Moist Heat;Iontophoresis 4mg /ml Dexamethasone;Stair training;Gait training;Functional mobility training;Therapeutic activities;Therapeutic exercise;Balance training;Neuromuscular re-education;Manual techniques;Patient/family education;Passive range of motion;Dry needling;Taping    PT Next Visit Plan Keep working on left hip IR; Lt hip flexor mobility, hip flexibility, lumbopelvic strength, balance and endurance.    PT Home Exercise Plan Access Code: [email or web address]    Consulted and Agree with Plan of Care Patient           Patient will benefit from skilled therapeutic intervention in order to improve the following deficits and impairments:  Abnormal gait,Decreased activity tolerance,Decreased balance,Postural dysfunction,Decreased strength,Impaired flexibility,Improper body mechanics,Pain,Decreased endurance,Increased muscle spasms,Difficulty walking,Decreased range of motion  Visit Diagnosis: Pain in left hip  Other abnormalities of gait and mobility  Muscle weakness (generalized)     Problem List Patient Active Problem List   Diagnosis Date Noted  . Coronary artery calcification 03/19/2018  . Ganglion of left wrist 03/07/2017  . Varicose veins of lower extremities with other complications 02/77/4128     Sigurd Sos, PT 01/13/21 12:30 PM  [facility] [address] [place], [place], [address] Phone: [phone]   Fax:  [phone]  Name: Karen Bonilla MRN: [id number] Date of Birth: [birth year]

## 2021-01-18 ENCOUNTER — Encounter: Payer: Self-pay | Admitting: Physical Therapy

## 2021-01-18 ENCOUNTER — Other Ambulatory Visit: Payer: Self-pay

## 2021-01-18 ENCOUNTER — Ambulatory Visit: Payer: Medicare PPO | Admitting: Physical Therapy

## 2021-01-18 DIAGNOSIS — M25552 Pain in left hip: Secondary | ICD-10-CM

## 2021-01-18 DIAGNOSIS — M6281 Muscle weakness (generalized): Secondary | ICD-10-CM | POA: Diagnosis not present

## 2021-01-18 DIAGNOSIS — R2689 Other abnormalities of gait and mobility: Secondary | ICD-10-CM | POA: Diagnosis not present

## 2021-01-18 NOTE — Therapy (Signed)
[facility] [address], [address] [place], [place], [address] Phone: [phone]   Fax:  [phone]  Physical Therapy Treatment  Patient Details  Name: Karen Bonilla MRN: [id number] Date of Birth: 08/08/1941 Referring Provider (PT): Barnes, Elizabeth, MD   Encounter Date: 01/18/2021   PT End of Session - 01/18/21 1153    Visit Number 7    Date for PT Re-Evaluation 02/02/21    Authorization Type Cohere: 12 visits 12/15-01/26/21    Authorization - Visit Number 7    Authorization - Number of Visits 12    Progress Note Due on Visit 10    PT Start Time 1150    PT Stop Time 1234    PT Time Calculation (min) 44 min    Activity Tolerance Patient tolerated treatment well    Behavior During Therapy WFL for tasks assessed/performed           Past Medical History:  Diagnosis Date  . Anemia   . Basal cell carcinoma 09/01/2002   right shin tx exc  . BCC (basal cell carcinoma of skin) 06/20/2013   right forearm TX WITH BX  . BCC (basal cell carcinoma of skin) 06/20/2013   right upper lip TX WITH BX  . BCC (basal cell carcinoma of skin) 10/03/2016   mid upper back TX WITH BX  . Colon polyps   . COPD (chronic obstructive pulmonary disease) ([facility])   . Hyperlipidemia   . Mucoid cyst of joint   . SCC (squamous cell carcinoma) 01/23/2011   left forearm scc/ka  TX CX3 5FU CAUTERY  . SCC (squamous cell carcinoma) 10/03/2016   right shin scc/ka TX CX3 5FU CAUTERY  . SCC (squamous cell carcinoma) 07/29/2019   left forehead scc TX CX3 5FU CAUTERY  . Spinal stenosis   . Squamous cell carcinoma of skin 10/23/2005   left lower shin inf. bowens  TX CX3 5FU CAUTERY  . Squamous cell carcinoma of skin 08/06/2020   in situ- right lower leg (CX35FU)  . Squamous cell carcinoma of skin 08/06/2020   in situ- right lower leg- posterior lower (CX35FU)  . Varicose veins of lower extremities with other complications 05/21/2012    Past Surgical  History:  Procedure Laterality Date  . ABDOMINAL HYSTERECTOMY    . MASS EXCISION  01/11/2012   Procedure: MINOR EXCISION OF MASS;  Surgeon: Robert V Sypher Jr., MD;  Location: [facility];  Service: Orthopedics;  Laterality: Right;  excision mucoid cyst right index and dip joint debridement    There were no vitals filed for this visit.   Subjective Assessment - 01/18/21 1153    Subjective Patient reporting no pain. Just feel a pull in the groin. It was easier getting my shoes and socks on today.    Pertinent History DDD and spinal stenosis of the lumbar spine, COPD    How long can you stand comfortably? limitations are due to chronic LBP    Patient Stated Goals reduce Lt hip pain with standing    Currently in Pain? No/denies              [facility] PT Assessment - 01/18/21 0001      Strength   Strength Assessment Site Hip    Right/Left Hip Right;Left    Right Hip Flexion --   5-/5   Right Hip ABduction 4+/5    Left Hip Flexion 4+/5    Left Hip ABduction 5/5                           [facility] Adult PT Treatment/Exercise - 01/18/21 0001      Transfers   Five time sit to stand comments  13.38 sec      Knee/Hip Exercises: Stretches   Sports administrator Left;1 rep;60 seconds    Quad Stretch Limitations prone with strap    Knee: Self-Stretch to increase Flexion Both;3 reps;30 seconds    Piriformis Stretch Both;30 seconds;3 reps      Knee/Hip Exercises: Standing   Hip Flexion Both;10 reps    Hip Flexion Limitations toe taps to 4 inch step; also did standing marching x 5 bil    SLS max hold bil with intermittent finger touch    Other Standing Knee Exercises tandem stance bil; standing eyes closed x 10 sec, eyes open feet together x 1 min   pt demo's good ability with these; she had to hold on to get into tandem stance but then could do with supervision.     Knee/Hip Exercises: Seated   Sit to Sand 2 sets;10 reps   improved control with stand to sit; functional  quad weakness with sit to stand     Knee/Hip Exercises: Sidelying   Hip ABduction Both;10 reps    Hip ADduction Both;2 sets;5 reps                  PT Education - 01/18/21 1247    Education Details HEP progressed    Person(s) Educated Patient    Methods Explanation;Demonstration;Handout    Comprehension Returned demonstration            PT Short Term Goals - 01/03/21 1622      PT SHORT TERM GOAL #1   Title independent with initial HEP    Status Achieved      PT SHORT TERM GOAL #2   Title report < or = to 4/10 Lt hip/LE pain when coming from sit to stand and getting out of the car    Baseline 4/10 max    Status Achieved      PT SHORT TERM GOAL #3   Title report a 30% reduction in the frequency and intensity of Lt hip/leg pain with change of position and walking    Status Achieved             PT Long Term Goals - 01/18/21 1201      PT LONG TERM GOAL #1   Title Independent with HEP    Status Partially Met      PT LONG TERM GOAL #2   Title report < or = to 2/10 Lt hip pain with sit to stand and getting out of the car    Status Achieved      PT LONG TERM GOAL #3   Title perform 5x sit to stand in < or = to 13 seconds to reduce falls risk    Baseline 13.38 sec    Status On-going      PT LONG TERM GOAL #4   Title report a 70% reduction in the frequency and intensity of Lt hip pain with change of position and walking    Status On-going      PT LONG TERM GOAL #5   Title demonstate 4+/5 Lt hip strength to improve stability and endurance with transitions and community activity    Status Achieved                 Plan - 01/18/21 1657    Clinical Impression Statement Patient continues to report improvements overall with no  pain reported today. She was able to don socks/shoes easier today and has improved significantly with sit to stand time. She still feels unsteady at times so we worked on balance some. She has difficulty with SLS and would benefit  from practice here. SLS and toe taps added to HEP as well as prone quad stretch as she is still tight in her left quads. MMT Strength goal has been met, but she does still have functional weakness with balance and sit to stand activities.    PT Frequency 2x / week    PT Duration 8 weeks    PT Treatment/Interventions ADLs/Self Care Home Management;Cryotherapy;Electrical Stimulation;Moist Heat;Iontophoresis 61m/ml Dexamethasone;Stair training;Gait training;Functional mobility training;Therapeutic activities;Therapeutic exercise;Balance training;Neuromuscular re-education;Manual techniques;Patient/family education;Passive range of motion;Dry needling;Taping    PT Next Visit Plan Keep working on left hip IR; Lt hip flexor mobility, hip flexibility, lumbopelvic strength, balance and endurance.    PT Home Exercise Plan Access Code: [email or web address]           Patient will benefit from skilled therapeutic intervention in order to improve the following deficits and impairments:  Abnormal gait,Decreased activity tolerance,Decreased balance,Postural dysfunction,Decreased strength,Impaired flexibility,Improper body mechanics,Pain,Decreased endurance,Increased muscle spasms,Difficulty walking,Decreased range of motion  Visit Diagnosis: Pain in left hip  Other abnormalities of gait and mobility  Muscle weakness (generalized)     Problem List Patient Active Problem List   Diagnosis Date Noted  . Coronary artery calcification 03/19/2018  . Ganglion of left wrist 03/07/2017  . Varicose veins of lower extremities with other complications 096/22/2979   JMadelyn FlavorsPT 01/18/2021, 5:04 PM  [facility] [address] [place] [address]Phone: [phone]  Fax:  [phone] Name: AMEKIAH WAHLERMRN: [id number] of Birth: [birth year]

## 2021-01-18 NOTE — Patient Instructions (Signed)
Access Code: [email or web address] URL: [email or web address] Date: 01/18/2021 Prepared by: Almyra Free  Exercises Supine Butterfly Groin Stretch - 2 x daily - 7 x weekly - 1 sets - 5 reps - 10-20 hold Seated Figure 4 Piriformis Stretch - 1 x daily - 7 x weekly - 3 sets - 10 reps Hip Flexor Stretch on Step - 2 x daily - 7 x weekly - 1 sets - 3 reps - 20 hold Sidelying ITB Stretch off Table - 2 x daily - 7 x weekly - 1 sets - 3 reps - 60 seconds hold Standing March with Counter Support - 1 x daily - 7 x weekly - 3 sets - 10 reps Standing Hip Abduction with Counter Support - 1 x daily - 7 x weekly - 3 sets - 10 reps Standing Hip Extension with Counter Support - 1 x daily - 7 x weekly - 3 sets - 10 reps Sit to Stand - 2 x daily - 7 x weekly - 2 sets - 10 reps Prone Hip Internal Rotation AROM - 1 x daily - 7 x weekly - 3 sets - 10 reps Single Leg Stance - 1 x daily - 7 x weekly - 1 sets - 5 reps Step Up - 1 x daily - 7 x weekly - 1-2 sets - 10 reps Prone Quadriceps Stretch with Strap - 1 x daily - 7 x weekly - 1 sets - 3 reps - 60 sec hold

## 2021-01-20 ENCOUNTER — Other Ambulatory Visit: Payer: Self-pay

## 2021-01-20 ENCOUNTER — Ambulatory Visit: Payer: Medicare PPO

## 2021-01-20 DIAGNOSIS — M25552 Pain in left hip: Secondary | ICD-10-CM

## 2021-01-20 DIAGNOSIS — M6281 Muscle weakness (generalized): Secondary | ICD-10-CM

## 2021-01-20 DIAGNOSIS — R2689 Other abnormalities of gait and mobility: Secondary | ICD-10-CM

## 2021-01-20 NOTE — Therapy (Signed)
[facility] [address] [place], [place], [address] Phone: [phone]   Fax:  [phone]  Physical Therapy Treatment  Patient Details  Name: Karen Bonilla MRN: [id number] Date of Birth: February 22, 1941 Referring Provider (PT): Leighton Ruff, MD   Encounter Date: 01/20/2021   PT End of Session - 01/20/21 1231    Visit Number 8    Date for PT Re-Evaluation 02/02/21    Authorization Type Cohere: 12 visits 12/15-01/26/21    Authorization - Visit Number 8    Authorization - Number of Visits 12    Progress Note Due on Visit 10    PT Start Time 1147    PT Stop Time 1229    PT Time Calculation (min) 42 min    Activity Tolerance Patient tolerated treatment well    Behavior During Therapy [facility] for tasks assessed/performed           Past Medical History:  Diagnosis Date  . Anemia   . Basal cell carcinoma 09/01/2002   right shin tx exc  . BCC (basal cell carcinoma of skin) 06/20/2013   right forearm TX WITH BX  . BCC (basal cell carcinoma of skin) 06/20/2013   right upper lip TX WITH BX  . BCC (basal cell carcinoma of skin) 10/03/2016   mid upper back [place]  . Colon polyps   . COPD (chronic obstructive pulmonary disease) ([place])   . Hyperlipidemia   . Mucoid cyst of joint   . SCC (squamous cell carcinoma) 01/23/2011   left forearm scc/ka  TX CX3 5FU CAUTERY  . SCC (squamous cell carcinoma) 10/03/2016   right shin scc/ka TX CX3 5FU CAUTERY  . SCC (squamous cell carcinoma) 07/29/2019   left forehead scc TX CX3 5FU CAUTERY  . Spinal stenosis   . Squamous cell carcinoma of skin 10/23/2005   left lower shin inf. bowens  TX CX3 5FU CAUTERY  . Squamous cell carcinoma of skin 08/06/2020   in situ- right lower leg (CX35FU)  . Squamous cell carcinoma of skin 08/06/2020   in situ- right lower leg- posterior lower (CX35FU)  . Varicose veins of lower extremities with other complications 9/41/7408    Past Surgical  History:  Procedure Laterality Date  . ABDOMINAL HYSTERECTOMY    . MASS EXCISION  01/11/2012   Procedure: MINOR EXCISION OF MASS;  Surgeon: Cammie Sickle., MD;  Location: [place];  Service: Orthopedics;  Laterality: Right;  excision mucoid cyst right index and dip joint debridement    There were no vitals filed for this visit.   Subjective Assessment - 01/20/21 1151    Subjective My back hurts, No Lt groin pain.    Currently in Pain? Yes    Pain Score 3     Pain Location Back    Pain Orientation Left;Right    Pain Descriptors / Indicators Aching    Pain Type Chronic pain    Pain Onset More than a month ago    Pain Frequency Intermittent    Aggravating Factors  morning hours, getting up from sitting, turnin on Lt LE    Pain Relieving Factors change of position                             [facility] Adult PT Treatment/Exercise - 01/20/21 0001      Knee/Hip Exercises: Stretches   Piriformis Stretch Both;30 seconds;3 reps  Other Knee/Hip Stretches butterfly stretch x5      Knee/Hip Exercises: Aerobic   Nustep --      Knee/Hip Exercises: Standing   Hip Flexion Both;20 reps    Hip Flexion Limitations toe taps to 4 inch step; also did standing marching x 5 bil    Other Standing Knee Exercises tandem stance bil;  eyes open feet together x 1 min   pt demo's good ability with these; she had to hold on to get into tandem stance but then could do with supervision.     Knee/Hip Exercises: Seated   Sit to Sand 2 sets;10 reps   improved control with stand to sit; functional quad weakness with sit to stand     Knee/Hip Exercises: Supine   Other Supine Knee/Hip Exercises ab bracing with ball squeeze 5" hold x 15, abduction with yellow loop around thighs x 15      Knee/Hip Exercises: Sidelying   Hip ABduction Both;10 reps      Manual Therapy   Soft tissue mobilization using addaday: Bil lumbar spine, gluteals and Lt lateral quad                     PT Short Term Goals - 01/03/21 1622      PT SHORT TERM GOAL #1   Title independent with initial HEP    Status Achieved      PT SHORT TERM GOAL #2   Title report < or = to 4/10 Lt hip/LE pain when coming from sit to stand and getting out of the car    Baseline 4/10 max    Status Achieved      PT SHORT TERM GOAL #3   Title report a 30% reduction in the frequency and intensity of Lt hip/leg pain with change of position and walking    Status Achieved             PT Long Term Goals - 01/18/21 1201      PT LONG TERM GOAL #1   Title Independent with HEP    Status Partially Met      PT LONG TERM GOAL #2   Title report < or = to 2/10 Lt hip pain with sit to stand and getting out of the car    Status Achieved      PT LONG TERM GOAL #3   Title perform 5x sit to stand in < or = to 13 seconds to reduce falls risk    Baseline 13.38 sec    Status On-going      PT LONG TERM GOAL #4   Title report a 70% reduction in the frequency and intensity of Lt hip pain with change of position and walking    Status On-going      PT LONG TERM GOAL #5   Title demonstate 4+/5 Lt hip strength to improve stability and endurance with transitions and community activity    Status Achieved                 Plan - 01/20/21 1234    Clinical Impression Statement Patient continues to report improvements overall with no pain reported today in the Lt groin.  Pt does reports 3/10 LBP.  Pt was able to attend water aerobics without Lt groin pain throughout.  Pt reports She was able to don socks/shoes with increased ease.  Pt activated her core well with exercise and required verbal and tactile cues for trunk alignment with standing exercise.  Pt with tension in bil lumbar muscles, gluteals and Lt quads and responded well to manual therapy.  Pt will continue to benefit from skilled PT to address strength, balance, pain and flexibility.    PT Frequency 2x / week    PT Duration 8 weeks     PT Treatment/Interventions ADLs/Self Care Home Management;Cryotherapy;Electrical Stimulation;Moist Heat;Iontophoresis 4mg /ml Dexamethasone;Stair training;Gait training;Functional mobility training;Therapeutic activities;Therapeutic exercise;Balance training;Neuromuscular re-education;Manual techniques;Patient/family education;Passive range of motion;Dry needling;Taping    PT Next Visit Plan Keep working on left hip IR; Lt hip flexor mobility, hip flexibility, lumbopelvic strength, balance and endurance.  Addaday to [place], groin and lumbar muscles as needed.    PT Home Exercise Plan Access Code: [email or web address]    Consulted and Agree with Plan of Care Patient           Patient will benefit from skilled therapeutic intervention in order to improve the following deficits and impairments:  Abnormal gait,Decreased activity tolerance,Decreased balance,Postural dysfunction,Decreased strength,Impaired flexibility,Improper body mechanics,Pain,Decreased endurance,Increased muscle spasms,Difficulty walking,Decreased range of motion  Visit Diagnosis: Pain in left hip  Other abnormalities of gait and mobility  Muscle weakness (generalized)     Problem List Patient Active Problem List   Diagnosis Date Noted  . Coronary artery calcification 03/19/2018  . Ganglion of left wrist 03/07/2017  . Varicose veins of lower extremities with other complications 15/37/9432    Sigurd Sos, PT 01/20/21 12:35 PM  [facility] [address] [place], [place], [address] Phone: [phone]   Fax:  [phone]  Name: Karen Bonilla MRN: [id number] Date of Birth: [birth year]

## 2021-01-25 ENCOUNTER — Ambulatory Visit: Payer: Medicare PPO | Attending: Family Medicine

## 2021-01-25 ENCOUNTER — Other Ambulatory Visit: Payer: Self-pay

## 2021-01-25 DIAGNOSIS — M6281 Muscle weakness (generalized): Secondary | ICD-10-CM | POA: Diagnosis not present

## 2021-01-25 DIAGNOSIS — R2689 Other abnormalities of gait and mobility: Secondary | ICD-10-CM

## 2021-01-25 DIAGNOSIS — M25552 Pain in left hip: Secondary | ICD-10-CM

## 2021-01-25 NOTE — Therapy (Signed)
[facility] [address], [place], [place], [address] Phone: [phone]   Fax:  [phone]  Physical Therapy Treatment  Patient Details  Name: Karen Bonilla MRN: [id number] Date of Birth: 1941-04-27 Referring Provider (PT): Leighton Ruff, MD   Encounter Date: 01/25/2021   PT End of Session - 01/25/21 1228    Visit Number 9    Date for PT Re-Evaluation 02/22/21    Authorization Type Cohere: 12 visits 12/15-01/26/21- more requested    Authorization - Visit Number 9    Progress Note Due on Visit 10    PT Start Time 1147    PT Stop Time 1230    PT Time Calculation (min) 43 min    Activity Tolerance Patient tolerated treatment well    Behavior During Therapy [facility] for tasks assessed/performed           Past Medical History:  Diagnosis Date  . Anemia   . Basal cell carcinoma 09/01/2002   right shin tx exc  . BCC (basal cell carcinoma of skin) 06/20/2013   right forearm TX WITH BX  . BCC (basal cell carcinoma of skin) 06/20/2013   right upper lip TX WITH BX  . BCC (basal cell carcinoma of skin) 10/03/2016   mid upper back [place]  . Colon polyps   . COPD (chronic obstructive pulmonary disease) ([place])   . Hyperlipidemia   . Mucoid cyst of joint   . SCC (squamous cell carcinoma) 01/23/2011   left forearm scc/ka  TX CX3 5FU CAUTERY  . SCC (squamous cell carcinoma) 10/03/2016   right shin scc/ka TX CX3 5FU CAUTERY  . SCC (squamous cell carcinoma) 07/29/2019   left forehead scc TX CX3 5FU CAUTERY  . Spinal stenosis   . Squamous cell carcinoma of skin 10/23/2005   left lower shin inf. bowens  TX CX3 5FU CAUTERY  . Squamous cell carcinoma of skin 08/06/2020   in situ- right lower leg (CX35FU)  . Squamous cell carcinoma of skin 08/06/2020   in situ- right lower leg- posterior lower (CX35FU)  . Varicose veins of lower extremities with other complications 0000000    Past Surgical History:  Procedure  Laterality Date  . ABDOMINAL HYSTERECTOMY    . MASS EXCISION  01/11/2012   Procedure: MINOR EXCISION OF MASS;  Surgeon: Cammie Sickle., MD;  Location: [place];  Service: Orthopedics;  Laterality: Right;  excision mucoid cyst right index and dip joint debridement    There were no vitals filed for this visit.   Subjective Assessment - 01/25/21 1150    Subjective I only had trouble doing the standing side kicks.  The addaday helped me to feel better.    Currently in Pain? No/denies              [facility] PT Assessment - 01/25/21 0001      Assessment   Medical Diagnosis Lt groin pain    Referring Provider (PT) Leighton Ruff, MD    Onset Date/Surgical Date 05/25/20    Hand Dominance Right      Cognition   Overall Cognitive Status Within Functional Limits for tasks assessed      Strength   Right Hip Flexion --   5-/5   Right Hip ABduction 4+/5    Left Hip Flexion 4+/5    Left Hip ABduction 5/5  [place] Adult PT Treatment/Exercise - 01/25/21 0001      Transfers   Five time sit to stand comments  13.95 seconds      Knee/Hip Exercises: Standing   Other Standing Knee Exercises tandem stance bil;  eyes open feet together x 1 min   pt demo's good ability with these; she had to hold on to get into tandem stance but then could do with supervision.     Knee/Hip Exercises: Seated   Sit to Sand 2 sets;10 reps   improved control with stand to sit; functional quad weakness with sit to stand     Knee/Hip Exercises: Supine   Other Supine Knee/Hip Exercises ab bracing with ball squeeze 5" hold x 15, abduction with yellow loop around thighs x 15      Knee/Hip Exercises: Sidelying   Hip ABduction Both;10 reps      Manual Therapy   Soft tissue mobilization using addaday: Lt groin and Lt lateral quad                    PT Short Term Goals - 01/03/21 1622      PT SHORT TERM GOAL #1   Title independent with initial  HEP    Status Achieved      PT SHORT TERM GOAL #2   Title report < or = to 4/10 Lt hip/LE pain when coming from sit to stand and getting out of the car    Baseline 4/10 max    Status Achieved      PT SHORT TERM GOAL #3   Title report a 30% reduction in the frequency and intensity of Lt hip/leg pain with change of position and walking    Status Achieved             PT Long Term Goals - 01/25/21 1154      PT LONG TERM GOAL #1   Title Independent with HEP    Time 4    Period Weeks    Status On-going    Target Date 02/22/21      PT LONG TERM GOAL #2   Title report < or = to 2/10 Lt hip pain with sit to stand and getting out of the car    Baseline no pain with this.  Max 3/10 with some activities-random    Time 4    Period Weeks    Status On-going    Target Date 02/22/21      PT LONG TERM GOAL #3   Title perform 5x sit to stand in < or = to 13 seconds to reduce falls risk    Baseline 13.95    Time 4    Period Weeks    Status On-going    Target Date 02/22/21      PT LONG TERM GOAL #4   Title report a 90% reduction in the frequency and intensity of Lt hip pain with change of position and walking    Baseline 75%    Time 4    Period Weeks    Status Revised    Target Date 02/22/21      PT LONG TERM GOAL #5   Title demonstate 4+/5 Lt hip strength to improve stability and endurance with transitions and community activity    Status Achieved                 Plan - 01/25/21 1205    Clinical Impression Statement Pt reports 75% overall reduction in Lt  groin pain since the start of care.  Pt is making strength and endurance gains in bil hips and core.  5x sit to stand is 13.95 seconds indicating a falls risk that is moderate.  Pt no longer has Lt groin pain getting in/out of the car.  Pain is now random and 1/44 max with certain movements.  .  Pt was able to attend water aerobics without Lt groin pain throughout.  Pt reports She was able to don socks/shoes with increased  ease.  Pt activated her core well with exercise and required verbal and tactile cues for trunk alignment with standing exercise.  Pt with tension in Lt groin and responded well to manual therapy.  Pt will continue to benefit from skilled PT to address strength, balance, pain and flexibility.    Rehab Potential Good    PT Frequency 2x / week    PT Duration 4 weeks    PT Treatment/Interventions ADLs/Self Care Home Management;Cryotherapy;Electrical Stimulation;Moist Heat;Iontophoresis 4mg /ml Dexamethasone;Stair training;Gait training;Functional mobility training;Therapeutic activities;Therapeutic exercise;Balance training;Neuromuscular re-education;Manual techniques;Patient/family education;Passive range of motion;Dry needling;Taping    PT Next Visit Plan Keep working on left hip IR; Lt hip flexor mobility, hip flexibility, lumbopelvic strength, balance and endurance.  Addaday to [place], groin and lumbar muscles as needed.    PT Home Exercise Plan Access Code: [email or web address]    Recommended Other Services recert sent 02/22/53    Consulted and Agree with Plan of Care Patient           Patient will benefit from skilled therapeutic intervention in order to improve the following deficits and impairments:  Abnormal gait,Decreased activity tolerance,Decreased balance,Postural dysfunction,Decreased strength,Impaired flexibility,Improper body mechanics,Pain,Decreased endurance,Increased muscle spasms,Difficulty walking,Decreased range of motion  Visit Diagnosis: Pain in left hip - Plan: PT plan of care cert/re-cert  Other abnormalities of gait and mobility - Plan: PT plan of care cert/re-cert  Muscle weakness (generalized) - Plan: PT plan of care cert/re-cert     Problem List Patient Active Problem List   Diagnosis Date Noted  . Coronary artery calcification 03/19/2018  . Ganglion of left wrist 03/07/2017  . Varicose veins of lower extremities with other complications 00/86/7619     Sigurd Sos,  PT 01/25/21 12:30 PM  [facility] [address] [place], [place], [address] Phone: [phone]   Fax:  [phone]  Name: RYELLE RUVALCABA MRN: [id number] Date of Birth: 06/16/1941

## 2021-01-27 ENCOUNTER — Other Ambulatory Visit: Payer: Self-pay

## 2021-01-27 ENCOUNTER — Ambulatory Visit: Payer: Medicare PPO

## 2021-01-27 DIAGNOSIS — M6281 Muscle weakness (generalized): Secondary | ICD-10-CM | POA: Diagnosis not present

## 2021-01-27 DIAGNOSIS — M25552 Pain in left hip: Secondary | ICD-10-CM | POA: Diagnosis not present

## 2021-01-27 DIAGNOSIS — R2689 Other abnormalities of gait and mobility: Secondary | ICD-10-CM

## 2021-01-27 NOTE — Therapy (Signed)
[facility] [address], [place], [place], [address] Phone: [phone]   Fax:  [phone]  Physical Therapy Treatment  Patient Details  Name: Karen Bonilla MRN: [id number] Date of Birth: 03/16/1941 Referring Provider (PT): Leighton Ruff, MD   Encounter Date: 01/27/2021 Progress Note Reporting Period 12/08/20 to 01/27/21  See note below for Objective Data and Assessment of Progress/Goals.       PT End of Session - 01/27/21 1227    Visit Number 10    Date for PT Re-Evaluation 02/22/21    Authorization Type Cohere: 8 visits 01/26/21-02/23/21    Authorization - Visit Number 1    Authorization - Number of Visits 8    Progress Note Due on Visit 20    PT Start Time 1146    PT Stop Time 1228    PT Time Calculation (min) 42 min    Activity Tolerance Patient tolerated treatment well    Behavior During Therapy [facility] for tasks assessed/performed           Past Medical History:  Diagnosis Date  . Anemia   . Basal cell carcinoma 09/01/2002   right shin tx exc  . BCC (basal cell carcinoma of skin) 06/20/2013   right forearm TX WITH BX  . BCC (basal cell carcinoma of skin) 06/20/2013   right upper lip TX WITH BX  . BCC (basal cell carcinoma of skin) 10/03/2016   mid upper back [place]  . Colon polyps   . COPD (chronic obstructive pulmonary disease) ([place])   . Hyperlipidemia   . Mucoid cyst of joint   . SCC (squamous cell carcinoma) 01/23/2011   left forearm scc/ka  TX CX3 5FU CAUTERY  . SCC (squamous cell carcinoma) 10/03/2016   right shin scc/ka TX CX3 5FU CAUTERY  . SCC (squamous cell carcinoma) 07/29/2019   left forehead scc TX CX3 5FU CAUTERY  . Spinal stenosis   . Squamous cell carcinoma of skin 10/23/2005   left lower shin inf. bowens  TX CX3 5FU CAUTERY  . Squamous cell carcinoma of skin 08/06/2020   in situ- right lower leg (CX35FU)  . Squamous cell carcinoma of skin 08/06/2020   in situ- right lower  leg- posterior lower (CX35FU)  . Varicose veins of lower extremities with other complications 01/02/3234    Past Surgical History:  Procedure Laterality Date  . ABDOMINAL HYSTERECTOMY    . MASS EXCISION  01/11/2012   Procedure: MINOR EXCISION OF MASS;  Surgeon: Cammie Sickle., MD;  Location: [place];  Service: Orthopedics;  Laterality: Right;  excision mucoid cyst right index and dip joint debridement    There were no vitals filed for this visit.   Subjective Assessment - 01/27/21 1146    Subjective I didn't do my exercises much yesterday.    Patient Stated Goals reduce Lt hip pain with standing    Currently in Pain? No/denies              [facility] PT Assessment - 01/27/21 0001      Assessment   Medical Diagnosis Lt groin pain    Referring Provider (PT) Leighton Ruff, MD    Onset Date/Surgical Date 05/25/20      Cognition   Overall Cognitive Status Within Functional Limits for tasks assessed      Posture/Postural Control   Posture/Postural Control Postural limitations    Postural Limitations Weight shift right;Flexed trunk;Forward head      Strength  Right Hip ABduction 4+/5    Left Hip Flexion 4+/5    Left Hip ABduction 5/5      Transfers   Five time sit to stand comments  13.95 seconds                         [facility] Adult PT Treatment/Exercise - 01/27/21 0001      Knee/Hip Exercises: Stretches   Piriformis Stretch Both;30 seconds;3 reps    Other Knee/Hip Stretches butterfly stretch x5      Knee/Hip Exercises: Aerobic   Nustep L2 x 6 min- PT present to discuss progress      Knee/Hip Exercises: Standing   Hip Flexion Both;20 reps    Hip Flexion Limitations toe taps to 8 inch step; did well with increased step height    Other Standing Knee Exercises tandem stance bil on foam pad: 2x30 seconds   pt demo's good ability with these; she had to hold on to get into tandem stance but then could do with supervision.     Knee/Hip  Exercises: Seated   Sit to Sand 2 sets;10 reps   improved control with stand to sit; functional quad weakness with sit to stand     Knee/Hip Exercises: Supine   Other Supine Knee/Hip Exercises ab bracing with ball squeeze 5" hold x 15, abduction with yellow loop around thighs x 15      Manual Therapy   Soft tissue mobilization using addaday: Lt groin and Lt lateral quad                    PT Short Term Goals - 01/03/21 1622      PT SHORT TERM GOAL #1   Title independent with initial HEP    Status Achieved      PT SHORT TERM GOAL #2   Title report < or = to 4/10 Lt hip/LE pain when coming from sit to stand and getting out of the car    Baseline 4/10 max    Status Achieved      PT SHORT TERM GOAL #3   Title report a 30% reduction in the frequency and intensity of Lt hip/leg pain with change of position and walking    Status Achieved             PT Long Term Goals - 01/25/21 1154      PT LONG TERM GOAL #1   Title Independent with HEP    Time 4    Period Weeks    Status On-going    Target Date 02/22/21      PT LONG TERM GOAL #2   Title report < or = to 2/10 Lt hip pain with sit to stand and getting out of the car    Baseline no pain with this.  Max 3/10 with some activities-random    Time 4    Period Weeks    Status On-going    Target Date 02/22/21      PT LONG TERM GOAL #3   Title perform 5x sit to stand in < or = to 13 seconds to reduce falls risk    Baseline 13.95    Time 4    Period Weeks    Status On-going    Target Date 02/22/21      PT LONG TERM GOAL #4   Title report a 90% reduction in the frequency and intensity of Lt hip pain with change of position  and walking    Baseline 75%    Time 4    Period Weeks    Status Revised    Target Date 02/22/21      PT LONG TERM GOAL #5   Title demonstate 4+/5 Lt hip strength to improve stability and endurance with transitions and community activity    Status Achieved                 Plan -  01/27/21 1204    Clinical Impression Statement Pt reports 75% overall reduction in Lt groin pain since the start of care.  Pt is making strength and endurance gains in bil hips and core.  Pt no longer has Lt groin pain getting in/out of the car.  Pain is now random and [id number] max with certain movements. Pt was able to attend water aerobics without Lt groin pain throughout.  Pt reports She was able to don socks/shoes with increased ease.  Pt activated her core well with exercise and required verbal and tactile cues for trunk alignment with standing exercise. Pt demonstrates more consistent postural awareness and makes corrections independently without cueing.  Pt with tension in Lt groin and responded well to manual therapy.  Pt will continue to benefit from skilled PT to address strength, balance, pain and flexibility.    PT Frequency 2x / week    PT Duration 4 weeks    PT Treatment/Interventions ADLs/Self Care Home Management;Cryotherapy;Electrical Stimulation;Moist Heat;Iontophoresis 4mg /ml Dexamethasone;Stair training;Gait training;Functional mobility training;Therapeutic activities;Therapeutic exercise;Balance training;Neuromuscular re-education;Manual techniques;Patient/family education;Passive range of motion;Dry needling;Taping    PT Next Visit Plan Keep working on left hip IR; Lt hip flexor mobility, hip flexibility, lumbopelvic strength, balance and endurance.  Addaday to [place], groin and lumbar muscles as needed.    PT Home Exercise Plan Access Code: [email or web address]    Consulted and Agree with Plan of Care Patient           Patient will benefit from skilled therapeutic intervention in order to improve the following deficits and impairments:  Abnormal gait,Decreased activity tolerance,Decreased balance,Postural dysfunction,Decreased strength,Impaired flexibility,Improper body mechanics,Pain,Decreased endurance,Increased muscle spasms,Difficulty walking,Decreased range of motion  Visit  Diagnosis: Pain in left hip  Other abnormalities of gait and mobility  Muscle weakness (generalized)     Problem List Patient Active Problem List   Diagnosis Date Noted  . Coronary artery calcification 03/19/2018  . Ganglion of left wrist 03/07/2017  . Varicose veins of lower extremities with other complications [id number]    Sigurd Sos, PT 01/27/21 12:30 PM   [facility] [address], [place], [place], [address] Phone: [phone]   Fax:  [phone]  Name: Karen Bonilla MRN: [id number] Date of Birth: [birth year]

## 2021-02-01 ENCOUNTER — Other Ambulatory Visit: Payer: Self-pay

## 2021-02-01 ENCOUNTER — Ambulatory Visit: Payer: Medicare PPO

## 2021-02-01 DIAGNOSIS — R2689 Other abnormalities of gait and mobility: Secondary | ICD-10-CM | POA: Diagnosis not present

## 2021-02-01 DIAGNOSIS — M25552 Pain in left hip: Secondary | ICD-10-CM | POA: Diagnosis not present

## 2021-02-01 DIAGNOSIS — M6281 Muscle weakness (generalized): Secondary | ICD-10-CM | POA: Diagnosis not present

## 2021-02-01 NOTE — Therapy (Signed)
[facility] [address], [place], [place], [address] Phone: [phone]   Fax:  [phone]  Physical Therapy Treatment  Patient Details  Name: Karen Bonilla MRN: [id number] Date of Birth: [birth year] Referring Provider (PT): Leighton Ruff, MD   Encounter Date: 02/01/2021   PT End of Session - 02/01/21 1223    Visit Number 11    Date for PT Re-Evaluation 02/22/21    Authorization Type Cohere: 8 visits 01/26/21-02/23/21    Authorization - Visit Number 2    Authorization - Number of Visits 8    Progress Note Due on Visit 20    PT Start Time 1146    PT Stop Time 1225    PT Time Calculation (min) 39 min    Activity Tolerance Patient tolerated treatment well    Behavior During Therapy [facility] for tasks assessed/performed           Past Medical History:  Diagnosis Date  . Anemia   . Basal cell carcinoma 09/01/2002   right shin tx exc  . BCC (basal cell carcinoma of skin) 06/20/2013   right forearm TX WITH BX  . BCC (basal cell carcinoma of skin) 06/20/2013   right upper lip TX WITH BX  . BCC (basal cell carcinoma of skin) 10/03/2016   mid upper back [place]  . Colon polyps   . COPD (chronic obstructive pulmonary disease) ([place])   . Hyperlipidemia   . Mucoid cyst of joint   . SCC (squamous cell carcinoma) 01/23/2011   left forearm scc/ka  TX CX3 5FU CAUTERY  . SCC (squamous cell carcinoma) 10/03/2016   right shin scc/ka TX CX3 5FU CAUTERY  . SCC (squamous cell carcinoma) 07/29/2019   left forehead scc TX CX3 5FU CAUTERY  . Spinal stenosis   . Squamous cell carcinoma of skin 10/23/2005   left lower shin inf. bowens  TX CX3 5FU CAUTERY  . Squamous cell carcinoma of skin 08/06/2020   in situ- right lower leg (CX35FU)  . Squamous cell carcinoma of skin 08/06/2020   in situ- right lower leg- posterior lower (CX35FU)  . Varicose veins of lower extremities with other complications 9/47/6546    Past Surgical  History:  Procedure Laterality Date  . ABDOMINAL HYSTERECTOMY    . MASS EXCISION  01/11/2012   Procedure: MINOR EXCISION OF MASS;  Surgeon: Cammie Sickle., MD;  Location: [facility];  Service: Orthopedics;  Laterality: Right;  excision mucoid cyst right index and dip joint debridement    There were no vitals filed for this visit.   Subjective Assessment - 02/01/21 1148    Subjective I tried getting out of a lower chair do do my exercises and it was harder.    Currently in Pain? Yes    Pain Location Back    Pain Orientation Left;Right    Pain Descriptors / Indicators Aching    Pain Type Chronic pain    Pain Onset More than a month ago    Pain Frequency Intermittent    Aggravating Factors  getting up and down from sitting, morning hours    Pain Relieving Factors change of position                             [facility] Adult PT Treatment/Exercise - 02/01/21 0001      Knee/Hip Exercises: Stretches   Active Hamstring Stretch Both;2 reps;20 seconds  Hip Flexor Stretch Left;2 reps;30 seconds;Right    Piriformis Stretch Both;30 seconds;3 reps    Other Knee/Hip Stretches butterfly stretch x5      Knee/Hip Exercises: Aerobic   Nustep L2 x 6 min- PT present to discuss progress      Knee/Hip Exercises: Standing   Hip Flexion Both;20 reps    Hip Flexion Limitations toe taps to 8 inch step; did well with increased step height.  2nd set standing on foam pad    Other Standing Knee Exercises tandem stance bil on foam pad: 2x30 seconds   pt demo's good ability with these; she had to hold on to get into tandem stance but then could do with supervision.   Other Standing Knee Exercises modified dead lift with 5# kettlebell      Knee/Hip Exercises: Seated   Sit to [place] 2 sets;10 reps   improved control with stand to sit; functional quad weakness with sit to stand     Knee/Hip Exercises: Supine   Other Supine Knee/Hip Exercises ab bracing with ball squeeze 5"  hold x 15, abduction with yellow loop around thighs x 15      Knee/Hip Exercises: Sidelying   Hip ABduction Both;10 reps      Manual Therapy   Soft tissue mobilization --                    PT Short Term Goals - 01/03/21 1622      PT SHORT TERM GOAL #1   Title independent with initial HEP    Status Achieved      PT SHORT TERM GOAL #2   Title report < or = to 4/10 Lt hip/LE pain when coming from sit to stand and getting out of the car    Baseline 4/10 max    Status Achieved      PT SHORT TERM GOAL #3   Title report a 30% reduction in the frequency and intensity of Lt hip/leg pain with change of position and walking    Status Achieved             PT Long Term Goals - 01/25/21 1154      PT LONG TERM GOAL #1   Title Independent with HEP    Time 4    Period Weeks    Status On-going    Target Date 02/22/21      PT LONG TERM GOAL #2   Title report < or = to 2/10 Lt hip pain with sit to stand and getting out of the car    Baseline no pain with this.  Max 3/10 with some activities-random    Time 4    Period Weeks    Status On-going    Target Date 02/22/21      PT LONG TERM GOAL #3   Title perform 5x sit to stand in < or = to 13 seconds to reduce falls risk    Baseline 13.95    Time 4    Period Weeks    Status On-going    Target Date 02/22/21      PT LONG TERM GOAL #4   Title report a 90% reduction in the frequency and intensity of Lt hip pain with change of position and walking    Baseline 75%    Time 4    Period Weeks    Status Revised    Target Date 02/22/21      PT LONG TERM GOAL #5  Title demonstate 4+/5 Lt hip strength to improve stability and endurance with transitions and community activity    Status Achieved                 Plan - 02/01/21 1203    Clinical Impression Statement Pt reports 75% overall reduction in Lt groin pain since the start of care.  Pt is making strength and endurance gains in bil hips and core.  Pt no longer  has Lt groin pain getting in/out of the car.  Pain is now random and 6/96 max with certain movements.  Pt activated her core well with exercise and required verbal and tactile cues for trunk alignment with standing exercise. Pt demonstrates more consistent postural awareness and makes corrections independently with less cueing.  Pt with tension in Lt groin and responded well to manual therapy.  Pt will continue to benefit from skilled PT to address strength, balance, pain and flexibility    PT Frequency 2x / week    PT Duration 4 weeks    PT Treatment/Interventions ADLs/Self Care Home Management;Cryotherapy;Electrical Stimulation;Moist Heat;Iontophoresis 4mg /ml Dexamethasone;Stair training;Gait training;Functional mobility training;Therapeutic activities;Therapeutic exercise;Balance training;Neuromuscular re-education;Manual techniques;Patient/family education;Passive range of motion;Dry needling;Taping    PT Next Visit Plan Keep working on left hip IR; Lt hip flexor mobility, hip flexibility, lumbopelvic strength, balance and endurance.  Addaday to [place], groin and lumbar muscles as needed.    PT Home Exercise Plan Access Code: [email or web address]    Consulted and Agree with Plan of Care Patient           Patient will benefit from skilled therapeutic intervention in order to improve the following deficits and impairments:  Abnormal gait,Decreased activity tolerance,Decreased balance,Postural dysfunction,Decreased strength,Impaired flexibility,Improper body mechanics,Pain,Decreased endurance,Increased muscle spasms,Difficulty walking,Decreased range of motion  Visit Diagnosis: Other abnormalities of gait and mobility  Pain in left hip  Muscle weakness (generalized)     Problem List Patient Active Problem List   Diagnosis Date Noted  . Coronary artery calcification 03/19/2018  . Ganglion of left wrist 03/07/2017  . Varicose veins of lower extremities with other complications 29/52/8413     Sigurd Sos, PT 02/01/21 12:25 PM  [facility] [address] [place], [place], [address] Phone: [phone]   Fax:  [phone]  Name: Karen Bonilla MRN: [id number] Date of Birth: 11-11-1941

## 2021-02-02 ENCOUNTER — Other Ambulatory Visit: Payer: Self-pay

## 2021-02-02 DIAGNOSIS — J302 Other seasonal allergic rhinitis: Secondary | ICD-10-CM | POA: Insufficient documentation

## 2021-02-02 DIAGNOSIS — M8588 Other specified disorders of bone density and structure, other site: Secondary | ICD-10-CM | POA: Insufficient documentation

## 2021-02-02 DIAGNOSIS — J449 Chronic obstructive pulmonary disease, unspecified: Secondary | ICD-10-CM | POA: Insufficient documentation

## 2021-02-02 DIAGNOSIS — I700 Atherosclerosis of aorta: Secondary | ICD-10-CM | POA: Insufficient documentation

## 2021-02-02 DIAGNOSIS — I8393 Asymptomatic varicose veins of bilateral lower extremities: Secondary | ICD-10-CM

## 2021-02-02 DIAGNOSIS — R9389 Abnormal findings on diagnostic imaging of other specified body structures: Secondary | ICD-10-CM | POA: Insufficient documentation

## 2021-02-02 DIAGNOSIS — E7800 Pure hypercholesterolemia, unspecified: Secondary | ICD-10-CM | POA: Insufficient documentation

## 2021-02-02 DIAGNOSIS — M4800 Spinal stenosis, site unspecified: Secondary | ICD-10-CM | POA: Insufficient documentation

## 2021-02-02 DIAGNOSIS — Z860100 Personal history of colon polyps, unspecified: Secondary | ICD-10-CM | POA: Insufficient documentation

## 2021-02-02 DIAGNOSIS — R430 Anosmia: Secondary | ICD-10-CM | POA: Insufficient documentation

## 2021-02-02 DIAGNOSIS — M5136 Other intervertebral disc degeneration, lumbar region: Secondary | ICD-10-CM | POA: Insufficient documentation

## 2021-02-02 DIAGNOSIS — D692 Other nonthrombocytopenic purpura: Secondary | ICD-10-CM | POA: Insufficient documentation

## 2021-02-03 ENCOUNTER — Other Ambulatory Visit: Payer: Self-pay

## 2021-02-03 ENCOUNTER — Ambulatory Visit: Payer: Medicare PPO

## 2021-02-03 DIAGNOSIS — M25552 Pain in left hip: Secondary | ICD-10-CM | POA: Diagnosis not present

## 2021-02-03 DIAGNOSIS — R2689 Other abnormalities of gait and mobility: Secondary | ICD-10-CM | POA: Diagnosis not present

## 2021-02-03 DIAGNOSIS — M6281 Muscle weakness (generalized): Secondary | ICD-10-CM

## 2021-02-03 NOTE — Therapy (Signed)
[facility] [address], [place], [place], [address] Phone: [phone]   Fax:  [phone]  Physical Therapy Treatment  Patient Details  Name: Karen Bonilla MRN: [id number] Date of Birth: 12/01/1941 Referring Provider (PT): Leighton Ruff, MD   Encounter Date: 02/03/2021   PT End of Session - 02/03/21 1226    Visit Number 12    Date for PT Re-Evaluation 02/22/21    Authorization Type [employer]here: 8 visits 01/26/21-02/23/21    Authorization - Visit Number 3    Authorization - Number of Visits 8    Progress Note Due on Visit 20    PT Start Time 1146    PT Stop Time 1227    PT Time Calculation (min) 41 min    Activity Tolerance Patient tolerated treatment well    Behavior During Therapy [facility] for tasks assessed/performed           Past Medical History:  Diagnosis Date  . Anemia   . Basal cell carcinoma 09/01/2002   right shin tx exc  . BCC (basal cell carcinoma of skin) 06/20/2013   right forearm TX WITH BX  . BCC (basal cell carcinoma of skin) 06/20/2013   right upper lip TX WITH BX  . BCC (basal cell carcinoma of skin) 10/03/2016   mid upper back [place]  . Colon polyps   . COPD (chronic obstructive pulmonary disease) ([place])   . Hyperlipidemia   . Mucoid cyst of joint   . SCC (squamous cell carcinoma) 01/23/2011   left forearm scc/ka  TX CX3 5FU CAUTERY  . SCC (squamous cell carcinoma) 10/03/2016   right shin scc/ka TX CX3 5FU CAUTERY  . SCC (squamous cell carcinoma) 07/29/2019   left forehead scc TX CX3 5FU CAUTERY  . Spinal stenosis   . Squamous cell carcinoma of skin 10/23/2005   left lower shin inf. bowens  TX CX3 5FU CAUTERY  . Squamous cell carcinoma of skin 08/06/2020   in situ- right lower leg (CX35FU)  . Squamous cell carcinoma of skin 08/06/2020   in situ- right lower leg- posterior lower (CX35FU)  . Varicose veins of lower extremities with other complications 8/84/1660    Past Surgical  History:  Procedure Laterality Date  . ABDOMINAL HYSTERECTOMY    . MASS EXCISION  01/11/2012   Procedure: MINOR EXCISION OF MASS;  Surgeon: Cammie Sickle., MD;  Location: [facility];  Service: Orthopedics;  Laterality: Right;  excision mucoid cyst right index and dip joint debridement    There were no vitals filed for this visit.   Subjective Assessment - 02/03/21 1153    Subjective I had a relapse yesterday.  I went to my water class and the first move caused pain.    Patient Stated Goals reduce Lt hip pain with standing    Currently in Pain? Yes    Pain Score 0-No pain   up to 6/10 yesterday.  0/10 now on the NuStep   Pain Location Groin    Pain Orientation Left    Pain Descriptors / Indicators Aching    Pain Type Chronic pain    Pain Onset More than a month ago                             [facility] Adult PT Treatment/Exercise - 02/03/21 0001      Knee/Hip Exercises: Stretches   Active Hamstring Stretch Both;2 reps;20  seconds    Hip Flexor Stretch Left;2 reps;30 seconds;Right    Piriformis Stretch Both;30 seconds;3 reps    Other Knee/Hip Stretches butterfly stretch x5      Knee/Hip Exercises: Aerobic   Nustep L3 x 8 min- PT present to discuss progress      Knee/Hip Exercises: Supine   Other Supine Knee/Hip Exercises ab bracing with ball squeeze 5" hold x 15, abduction with yellow loop around thighs x 15      Manual Therapy   Soft tissue mobilization using addaday: Lt groin and Lt lateral quad                    PT Short Term Goals - 01/03/21 1622      PT SHORT TERM GOAL #1   Title independent with initial HEP    Status Achieved      PT SHORT TERM GOAL #2   Title report < or = to 4/10 Lt hip/LE pain when coming from sit to stand and getting out of the car    Baseline 4/10 max    Status Achieved      PT SHORT TERM GOAL #3   Title report a 30% reduction in the frequency and intensity of Lt hip/leg pain with change of  position and walking    Status Achieved             PT Long Term Goals - 01/25/21 1154      PT LONG TERM GOAL #1   Title Independent with HEP    Time 4    Period Weeks    Status On-going    Target Date 02/22/21      PT LONG TERM GOAL #2   Title report < or = to 2/10 Lt hip pain with sit to stand and getting out of the car    Baseline no pain with this.  Max 3/10 with some activities-random    Time 4    Period Weeks    Status On-going    Target Date 02/22/21      PT LONG TERM GOAL #3   Title perform 5x sit to stand in < or = to 13 seconds to reduce falls risk    Baseline 13.95    Time 4    Period Weeks    Status On-going    Target Date 02/22/21      PT LONG TERM GOAL #4   Title report a 90% reduction in the frequency and intensity of Lt hip pain with change of position and walking    Baseline 75%    Time 4    Period Weeks    Status Revised    Target Date 02/22/21      PT LONG TERM GOAL #5   Title demonstate 4+/5 Lt hip strength to improve stability and endurance with transitions and community activity    Status Achieved                 Plan - 02/03/21 1158    Clinical Impression Statement Pt reports a flare-up yesterday after taking her water aerobics class.  Pain in Lt groin was 6/10.  Pt arrives today without pain.   Pt no longer has Lt groin pain getting in/out of the car.  Pain is now random and 7/82 max with certain movements.  Session was focused on gentle mobility and strength as she had a flare-up yesterday.  Pt with tension in Lt groin and responded well to manual  therapy.  Pt will continue to benefit from skilled PT to address strength, balance, pain and flexibility.    PT Frequency 2x / week    PT Duration 4 weeks    PT Treatment/Interventions ADLs/Self Care Home Management;Cryotherapy;Electrical Stimulation;Moist Heat;Iontophoresis 4mg /ml Dexamethasone;Stair training;Gait training;Functional mobility training;Therapeutic activities;Therapeutic  exercise;Balance training;Neuromuscular re-education;Manual techniques;Patient/family education;Passive range of motion;Dry needling;Taping    PT Next Visit Plan Keep working on left hip IR; Lt hip flexor mobility, hip flexibility, lumbopelvic strength, balance and endurance.  Addaday to [place], groin and lumbar muscles as needed.    PT Home Exercise Plan Access Code: [email or web address]    Consulted and Agree with Plan of Care Patient           Patient will benefit from skilled therapeutic intervention in order to improve the following deficits and impairments:  Abnormal gait,Decreased activity tolerance,Decreased balance,Postural dysfunction,Decreased strength,Impaired flexibility,Improper body mechanics,Pain,Decreased endurance,Increased muscle spasms,Difficulty walking,Decreased range of motion  Visit Diagnosis: Other abnormalities of gait and mobility  Pain in left hip  Muscle weakness (generalized)     Problem List Patient Active Problem List   Diagnosis Date Noted  . Anosmia 02/02/2021  . Degeneration of lumbar intervertebral disc 02/02/2021  . Hardening of the aorta (main artery of the heart) ([place]) 02/02/2021  . Hypercholesterolemia 02/02/2021  . Other seasonal allergic rhinitis 02/02/2021  . Other specified disorders of bone density and structure, other site 02/02/2021  . Personal history of colonic polyps 02/02/2021  . Chronic obstructive pulmonary disease ([place]) 02/02/2021  . Senile purpura ([place]) 02/02/2021  . Spinal stenosis 02/02/2021  . Abnormal findings on diagnostic imaging of other specified body structures 02/02/2021  . Coronary artery calcification 03/19/2018  . Ganglion of left wrist 03/07/2017  . Varicose veins of lower extremities with other complications 11/57/2620    Sigurd Sos, PT 02/03/21 12:29 PM  [facility] [address] [place], [place], [address] Phone: [phone]   Fax:   [phone]  Name: JOVI ALVIZO MRN: [id number] Date of Birth: 1941/02/27

## 2021-02-17 ENCOUNTER — Encounter (HOSPITAL_COMMUNITY): Payer: Medicare PPO

## 2021-02-17 ENCOUNTER — Encounter: Payer: Medicare PPO | Admitting: Vascular Surgery

## 2021-02-22 ENCOUNTER — Other Ambulatory Visit: Payer: Self-pay

## 2021-02-22 ENCOUNTER — Ambulatory Visit: Payer: Medicare PPO | Attending: Family Medicine | Admitting: Physical Therapy

## 2021-02-22 ENCOUNTER — Encounter: Payer: Self-pay | Admitting: Physical Therapy

## 2021-02-22 DIAGNOSIS — M25552 Pain in left hip: Secondary | ICD-10-CM | POA: Diagnosis not present

## 2021-02-22 DIAGNOSIS — R2689 Other abnormalities of gait and mobility: Secondary | ICD-10-CM | POA: Insufficient documentation

## 2021-02-22 DIAGNOSIS — M6281 Muscle weakness (generalized): Secondary | ICD-10-CM | POA: Insufficient documentation

## 2021-02-22 NOTE — Therapy (Signed)
[facility] [address], [address] [place], [place], [address] Phone: [phone]   Fax:  [phone]  Physical Therapy Treatment  Patient Details  Name: Karen Bonilla MRN: [id number] Date of Birth: 17-Apr-1941 Referring Provider (PT): Juluis Rainier, MD   Encounter Date: 02/22/2021   PT End of Session - 02/22/21 1153    Visit Number 13    Number of Visits 21    Date for PT Re-Evaluation 04/21/21    Authorization Type Cohere: 8 visits 02/24/21-04/18/21    Progress Note Due on Visit 20    PT Start Time 1147    PT Stop Time 1233    PT Time Calculation (min) 46 min    Activity Tolerance Patient tolerated treatment well    Behavior During Therapy [facility] for tasks assessed/performed           Past Medical History:  Diagnosis Date  . Anemia   . Basal cell carcinoma 09/01/2002   right shin tx exc  . BCC (basal cell carcinoma of skin) 06/20/2013   right forearm TX WITH BX  . BCC (basal cell carcinoma of skin) 06/20/2013   right upper lip TX WITH BX  . BCC (basal cell carcinoma of skin) 10/03/2016   mid upper back TX WITH BX  . Colon polyps   . COPD (chronic obstructive pulmonary disease) (HCC)   . Hyperlipidemia   . Mucoid cyst of joint   . SCC (squamous cell carcinoma) 01/23/2011   left forearm scc/ka  TX CX3 5FU CAUTERY  . SCC (squamous cell carcinoma) 10/03/2016   right shin scc/ka TX CX3 5FU CAUTERY  . SCC (squamous cell carcinoma) 07/29/2019   left forehead scc TX CX3 5FU CAUTERY  . Spinal stenosis   . Squamous cell carcinoma of skin 10/23/2005   left lower shin inf. bowens  TX CX3 5FU CAUTERY  . Squamous cell carcinoma of skin 08/06/2020   in situ- right lower leg (CX35FU)  . Squamous cell carcinoma of skin 08/06/2020   in situ- right lower leg- posterior lower (CX35FU)  . Varicose veins of lower extremities with other complications 05/21/2012    Past Surgical History:  Procedure Laterality Date  . ABDOMINAL  HYSTERECTOMY    . MASS EXCISION  01/11/2012   Procedure: MINOR EXCISION OF MASS;  Surgeon: Wyn Forster., MD;  Location: [facility];  Service: Orthopedics;  Laterality: Right;  excision mucoid cyst right index and dip joint debridement    There were no vitals filed for this visit.   Subjective Assessment - 02/22/21 1156    Subjective No pain in groin or lateral thigh today. Back is 3/10 in the left side. Patient reports feeling lateral thigh pain with supine clams and stretching ADD and with standing activities. She is still having difficulty maintaining upright postrure with walking.    Pertinent History DDD and spinal stenosis of the lumbar spine, COPD    How long can you stand comfortably? limitations are due to chronic LBP    Patient Stated Goals reduce Lt hip pain with standing    Currently in Pain? Yes    Pain Score 3     Pain Location Back    Pain Orientation Left    Pain Descriptors / Indicators Aching    Pain Type Chronic pain    Pain Radiating Towards into left thigh    Pain Onset More than a month ago    Pain Frequency Intermittent    Aggravating  Factors  hip ABD/ER, certain standing activities                             [facility] Adult PT Treatment/Exercise - 02/22/21 0001      Self-Care   Self-Care Other Self-Care Comments    Other Self-Care Comments  self MFR to left hip using ball; Also had lenghty discussion of POC and current status      Knee/Hip Exercises: Stretches   Hip Flexor Stretch Both;1 rep;30 seconds    Hip Flexor Stretch Limitations Thomas Test position    Other Knee/Hip Stretches butterfly stretch and SDLY clam x 10 to try to provoke lateral leg pain      Knee/Hip Exercises: Standing   Gait Training ambulated at slow cadence with upright posture; Also worked on maintaining correct posture at a slow cadence    Other Standing Knee Exercises trunk extension at counter with verbal cues to push hips forward x 10       Knee/Hip Exercises: Seated   Other Seated Knee/Hip Exercises 5# ketttle bell at chest trunk extension x 20                  PT Education - 02/22/21 1335    Education Details HEP progressed    Person(s) Educated Patient    Methods Explanation;Demonstration;Handout    Comprehension Verbalized understanding;Returned demonstration            PT Short Term Goals - 01/03/21 1622      PT SHORT TERM GOAL #1   Title independent with initial HEP    Status Achieved      PT SHORT TERM GOAL #2   Title report < or = to 4/10 Lt hip/LE pain when coming from sit to stand and getting out of the car    Baseline 4/10 max    Status Achieved      PT SHORT TERM GOAL #3   Title report a 30% reduction in the frequency and intensity of Lt hip/leg pain with change of position and walking    Status Achieved             PT Long Term Goals - 02/22/21 1157      PT LONG TERM GOAL #1   Title Independent with HEP    Status Achieved      PT LONG TERM GOAL #2   Title report < or = to 2/10 Lt hip pain with sit to stand and getting out of the car    Status Achieved      PT LONG TERM GOAL #3   Title perform 5x sit to stand in < or = to 13 seconds to reduce falls risk    Baseline 11.48    Status Achieved      PT LONG TERM GOAL #4   Title report a 90% reduction in the frequency and intensity of Lt hip pain with change of position and walking    Baseline Patient reports >= 90% reduction    Status Achieved      PT LONG TERM GOAL #5   Title demonstate 4+/5 Lt hip strength to improve stability and endurance with transitions and community activity    Status Achieved      Additional Long Term Goals   Additional Long Term Goals Yes      PT LONG TERM GOAL #6   Title Patient able to ambulate without forward trunk lean for >= 25  steps at her normal cadence.    Baseline 10 steps    Time 8    Period Weeks    Status New      PT LONG TERM GOAL #7   Title Patient to demo negative Thomas test  bil to assist with normal gait pattern.    Time 8    Period Weeks    Status New                 Plan - 02/22/21 1338    Clinical Impression Statement Patient presents today without pain in her left groin or lateral leg, but states that she continues to have intermittent lateral left thigh pain with left hip ABD and ER and some standing activities. She also reports she still has difficulty holding herself upright with walking. She can stand upright but as soon as she walks her normal cadence her trunk flexes. We tested her today and she could walk 10 steps at normal cadence before trunk flexion. At a slow cadence she could walk 25 feet. She has a positive Thomas test bil which is likely contributing to this and would also benefit from trunk extensor strengthening. Patient has met her LTGs regarding left groin pain, but she would benefit from continued PT to address these aforementioned deficits.    Personal Factors and Comorbidities Comorbidity 2    Comorbidities lumbar stenosis, DDD and COPD    Examination-Activity Limitations Sit;Squat;Stand;Transfers    Examination-Participation Restrictions Driving;Community Activity    PT Frequency 2x / week    PT Duration 4 weeks    PT Treatment/Interventions ADLs/Self Care Home Management;Cryotherapy;Electrical Stimulation;Moist Heat;Iontophoresis 4mg /ml Dexamethasone;Stair training;Gait training;Functional mobility training;Therapeutic activities;Therapeutic exercise;Balance training;Neuromuscular re-education;Manual techniques;Patient/family education;Passive range of motion;Dry needling;Taping    PT Next Visit Plan Focus on trunk extensor stretch, iliopsoas flexibiliity and gait.    PT Home Exercise Plan Access Code: [email or web address]    Recommended Other Services recert sent 08/28/48    Consulted and Agree with Plan of Care Patient           Patient will benefit from skilled therapeutic intervention in order to improve the following deficits and  impairments:  Abnormal gait,Decreased activity tolerance,Decreased balance,Postural dysfunction,Decreased strength,Impaired flexibility,Improper body mechanics,Pain,Decreased endurance,Increased muscle spasms,Difficulty walking,Decreased range of motion  Visit Diagnosis: Other abnormalities of gait and mobility  Pain in left hip  Muscle weakness (generalized)     Problem List Patient Active Problem List   Diagnosis Date Noted  . Anosmia 02/02/2021  . Degeneration of lumbar intervertebral disc 02/02/2021  . Hardening of the aorta (main artery of the heart) ([place]) 02/02/2021  . Hypercholesterolemia 02/02/2021  . Other seasonal allergic rhinitis 02/02/2021  . Other specified disorders of bone density and structure, other site 02/02/2021  . Personal history of colonic polyps 02/02/2021  . Chronic obstructive pulmonary disease ([place]) 02/02/2021  . Senile purpura ([place]) 02/02/2021  . Spinal stenosis 02/02/2021  . Abnormal findings on diagnostic imaging of other specified body structures 02/02/2021  . Coronary artery calcification 03/19/2018  . Ganglion of left wrist 03/07/2017  . Varicose veins of lower extremities with other complications 67/59/1638    Madelyn Flavors PT 02/22/2021, 1:57 PM  [facility] [address] [place], [place], [address] Phone: [phone]   Fax:  [phone]  Name: Karen Bonilla MRN: [id number] Date of Birth: [birth year]

## 2021-02-22 NOTE — Patient Instructions (Signed)
Access Code: [email or web address] URL: [email or web address] Date: 02/22/2021 Prepared by: Almyra Free  Exercises Supine Butterfly Groin Stretch - 2 x daily - 7 x weekly - 1 sets - 5 reps - 10-20 hold Seated Figure 4 Piriformis Stretch - 1 x daily - 7 x weekly - 3 sets - 10 reps Hip Flexor Stretch on Step - 2 x daily - 7 x weekly - 1 sets - 3 reps - 20 hold Sidelying ITB Stretch off Table - 2 x daily - 7 x weekly - 1 sets - 3 reps - 60 seconds hold Standing March with Counter Support - 1 x daily - 7 x weekly - 3 sets - 10 reps Standing Hip Abduction with Counter Support - 1 x daily - 7 x weekly - 3 sets - 10 reps Standing Hip Extension with Counter Support - 1 x daily - 7 x weekly - 3 sets - 10 reps Sit to Stand - 2 x daily - 7 x weekly - 2 sets - 10 reps Prone Hip Internal Rotation AROM - 1 x daily - 7 x weekly - 3 sets - 10 reps Single Leg Stance - 1 x daily - 7 x weekly - 1 sets - 5 reps Step Up - 1 x daily - 7 x weekly - 1-2 sets - 10 reps Prone Quadriceps Stretch with Strap - 1 x daily - 7 x weekly - 1 sets - 3 reps - 60 sec hold Seated Thoracic Lumbar Extension - 1 x daily - 7 x weekly - 3 sets - 10 reps Thomas Stretch on Table - 2 x daily - 7 x weekly - 3 reps - 1 sets - 30-60 sec hold

## 2021-02-28 ENCOUNTER — Ambulatory Visit: Payer: Medicare PPO

## 2021-02-28 ENCOUNTER — Telehealth: Payer: Self-pay

## 2021-02-28 NOTE — Telephone Encounter (Signed)
PT called pt due to missed appointment today.  PT left message on pt's voicemail to call back.

## 2021-03-03 ENCOUNTER — Ambulatory Visit: Payer: Medicare PPO | Admitting: Physical Therapy

## 2021-03-03 ENCOUNTER — Encounter: Payer: Self-pay | Admitting: Physical Therapy

## 2021-03-03 ENCOUNTER — Other Ambulatory Visit: Payer: Self-pay

## 2021-03-03 DIAGNOSIS — M6281 Muscle weakness (generalized): Secondary | ICD-10-CM | POA: Diagnosis not present

## 2021-03-03 DIAGNOSIS — R2689 Other abnormalities of gait and mobility: Secondary | ICD-10-CM | POA: Diagnosis not present

## 2021-03-03 DIAGNOSIS — M25552 Pain in left hip: Secondary | ICD-10-CM | POA: Diagnosis not present

## 2021-03-03 NOTE — Therapy (Signed)
[facility] [address], [place], [place], [address] Phone: [phone]   Fax:  [phone]  Physical Therapy Treatment  Patient Details  Name: Karen Bonilla MRN: [id number] Date of Birth: 11/10/1941 Referring Provider (PT): Leighton Ruff, MD   Encounter Date: 03/03/2021   PT End of Session - 03/03/21 1449    Visit Number 14    Number of Visits 21    Date for PT Re-Evaluation 04/21/21    Authorization Type Cohere: 8 visits 02/24/21-04/18/21    Authorization - Visit Number 4    Authorization - Number of Visits 8    Progress Note Due on Visit 20    PT Start Time 2229    PT Stop Time 1528    PT Time Calculation (min) 43 min           Past Medical History:  Diagnosis Date  . Anemia   . Basal cell carcinoma 09/01/2002   right shin tx exc  . BCC (basal cell carcinoma of skin) 06/20/2013   right forearm TX WITH BX  . BCC (basal cell carcinoma of skin) 06/20/2013   right upper lip TX WITH BX  . BCC (basal cell carcinoma of skin) 10/03/2016   mid upper back [place]  . Colon polyps   . COPD (chronic obstructive pulmonary disease) ([place])   . Hyperlipidemia   . Mucoid cyst of joint   . SCC (squamous cell carcinoma) 01/23/2011   left forearm scc/ka  TX CX3 5FU CAUTERY  . SCC (squamous cell carcinoma) 10/03/2016   right shin scc/ka TX CX3 5FU CAUTERY  . SCC (squamous cell carcinoma) 07/29/2019   left forehead scc TX CX3 5FU CAUTERY  . Spinal stenosis   . Squamous cell carcinoma of skin 10/23/2005   left lower shin inf. bowens  TX CX3 5FU CAUTERY  . Squamous cell carcinoma of skin 08/06/2020   in situ- right lower leg (CX35FU)  . Squamous cell carcinoma of skin 08/06/2020   in situ- right lower leg- posterior lower (CX35FU)  . Varicose veins of lower extremities with other complications 7/98/9211    Past Surgical History:  Procedure Laterality Date  . ABDOMINAL HYSTERECTOMY    . MASS EXCISION  01/11/2012    Procedure: MINOR EXCISION OF MASS;  Surgeon: Cammie Sickle., MD;  Location: [facility];  Service: Orthopedics;  Laterality: Right;  excision mucoid cyst right index and dip joint debridement    There were no vitals filed for this visit.   Subjective Assessment - 03/03/21 1448    Subjective Patient reports increased back pain throughout the week. Reports no relief generally following sessions. States that today lumbar pain is Rt sided but fluctuates and varies between Lt and Rt side.    Pertinent History DDD and spinal stenosis of the lumbar spine, COPD    How long can you stand comfortably? limitations are due to chronic LBP    Patient Stated Goals reduce Lt hip pain with standing    Currently in Pain? Yes    Pain Score 5     Pain Location Back    Pain Orientation Right    Pain Descriptors / Indicators Aching    Pain Type Chronic pain    Aggravating Factors  sidelying on Lt side and supine lying  [facility] Adult PT Treatment/Exercise - 03/03/21 0001      Knee/Hip Exercises: Stretches   Hip Flexor Stretch Both;1 rep;30 seconds    Hip Flexor Stretch Limitations fwd lunge stretch at step    Other Knee/Hip Stretches butterfly stretch 2 x 20s and SDLY clam 2 x 10      Knee/Hip Exercises: Aerobic   Nustep L3 x 5 min- PT present to discuss progress      Knee/Hip Exercises: Standing   Hip Extension Stengthening;Both;15 reps    Extension Limitations tactile cues for erect posture    Gait Training ambulating at slow cadence with focus on upright posture   VC/TC to facilitate lumbar erectors and scapular retractors   Other Standing Knee Exercises trunk extension at counter with verbal cues to push hips forward x 10      Knee/Hip Exercises: Seated   Other Seated Knee/Hip Exercises 5# ketttle bell at chest trunk extension x 20    Other Seated Knee/Hip Exercises rows green tband seated on table with black foam      Knee/Hip  Exercises: Supine   Bridges Strengthening;10 reps    Bridges Limitations articulate bridge through partial ROM    Other Supine Knee/Hip Exercises hip ADD squeeze ball 3s x 20    Other Supine Knee/Hip Exercises hip ABD yellow loop   glute sets 15x3s hold; VC for proper glute contraction                 PT Education - 03/03/21 1540    Education Details continued focus on upright trunk when ambulating    Person(s) Educated Patient    Methods Explanation;Tactile cues;Verbal cues    Comprehension Verbalized understanding;Need further instruction            PT Short Term Goals - 01/03/21 1622      PT SHORT TERM GOAL #1   Title independent with initial HEP    Status Achieved      PT SHORT TERM GOAL #2   Title report < or = to 4/10 Lt hip/LE pain when coming from sit to stand and getting out of the car    Baseline 4/10 max    Status Achieved      PT SHORT TERM GOAL #3   Title report a 30% reduction in the frequency and intensity of Lt hip/leg pain with change of position and walking    Status Achieved             PT Long Term Goals - 02/22/21 1157      PT LONG TERM GOAL #1   Title Independent with HEP    Status Achieved      PT LONG TERM GOAL #2   Title report < or = to 2/10 Lt hip pain with sit to stand and getting out of the car    Status Achieved      PT LONG TERM GOAL #3   Title perform 5x sit to stand in < or = to 13 seconds to reduce falls risk    Baseline 11.48    Status Achieved      PT LONG TERM GOAL #4   Title report a 90% reduction in the frequency and intensity of Lt hip pain with change of position and walking    Baseline Patient reports >= 90% reduction    Status Achieved      PT LONG TERM GOAL #5   Title demonstate 4+/5 Lt hip strength to improve stability and endurance with  transitions and community activity    Status Achieved      Additional Long Term Goals   Additional Long Term Goals Yes      PT LONG TERM GOAL #6   Title Patient  able to ambulate without forward trunk lean for >= 25 steps at her normal cadence.    Baseline 10 steps    Time 8    Period Weeks    Status New      PT LONG TERM GOAL #7   Title Patient to demo negative Thomas test bil to assist with normal gait pattern.    Time 8    Period Weeks    Status New                 Plan - 03/03/21 1450    Clinical Impression Statement Patient notes increased Lt LE pain when attempting Lt sidelying and when attempting to lie supine. States that groin pain is much better however she continues to have pain in the rest of her Lt LE. Patient reports compliance with HEP. Requiring cuing for proper glute activation in supine as patient resorting to excessive hip adduction rather than initiation of glute contraction. Demonstrates improved awareness of trunk positioning when ambulating but continues to be limited by poor muscular endurance of hip and trunk extensors. Patient reporting decreased lumbar pain at end of session. Would benefit from continued skilled intervention to address impairments for improved functional mobility and decreased pain.    Personal Factors and Comorbidities Comorbidity 2    Comorbidities lumbar stenosis, DDD and COPD    Examination-Activity Limitations Sit;Squat;Stand;Transfers    Examination-Participation Restrictions Driving;Community Activity    PT Frequency 2x / week    PT Duration 4 weeks    PT Treatment/Interventions ADLs/Self Care Home Management;Cryotherapy;Electrical Stimulation;Moist Heat;Iontophoresis 4mg /ml Dexamethasone;Stair training;Gait training;Functional mobility training;Therapeutic activities;Therapeutic exercise;Balance training;Neuromuscular re-education;Manual techniques;Patient/family education;Passive range of motion;Dry needling;Taping    PT Next Visit Plan continue focus on trunk extensor strengthening, gait, and iliopsoas flexibility    PT Home Exercise Plan Access Code: [email or web address]    Consulted and Agree with  Plan of Care Patient           Patient will benefit from skilled therapeutic intervention in order to improve the following deficits and impairments:  Abnormal gait,Decreased activity tolerance,Decreased balance,Postural dysfunction,Decreased strength,Impaired flexibility,Improper body mechanics,Pain,Decreased endurance,Increased muscle spasms,Difficulty walking,Decreased range of motion  Visit Diagnosis: Other abnormalities of gait and mobility  Pain in left hip  Muscle weakness (generalized)     Problem List Patient Active Problem List   Diagnosis Date Noted  . Anosmia 02/02/2021  . Degeneration of lumbar intervertebral disc 02/02/2021  . Hardening of the aorta (main artery of the heart) ([place]) 02/02/2021  . Hypercholesterolemia 02/02/2021  . Other seasonal allergic rhinitis 02/02/2021  . Other specified disorders of bone density and structure, other site 02/02/2021  . Personal history of colonic polyps 02/02/2021  . Chronic obstructive pulmonary disease ([place]) 02/02/2021  . Senile purpura ([place]) 02/02/2021  . Spinal stenosis 02/02/2021  . Abnormal findings on diagnostic imaging of other specified body structures 02/02/2021  . Coronary artery calcification 03/19/2018  . Ganglion of left wrist 03/07/2017  . Varicose veins of lower extremities with other complications 22/29/7989   Everardo All PT, DPT  03/03/21 3:41 PM  [facility] [address], [place], [place], [address] Phone: [phone]   Fax:  [phone]  Name: Karen Bonilla MRN: [id number] Date of Birth: 10/16/1941

## 2021-03-10 ENCOUNTER — Other Ambulatory Visit: Payer: Self-pay

## 2021-03-10 ENCOUNTER — Ambulatory Visit: Payer: Medicare PPO

## 2021-03-10 DIAGNOSIS — M25552 Pain in left hip: Secondary | ICD-10-CM | POA: Diagnosis not present

## 2021-03-10 DIAGNOSIS — R2689 Other abnormalities of gait and mobility: Secondary | ICD-10-CM | POA: Diagnosis not present

## 2021-03-10 DIAGNOSIS — M6281 Muscle weakness (generalized): Secondary | ICD-10-CM

## 2021-03-10 NOTE — Therapy (Signed)
[facility] [address] [place], [place], [address] Phone: [phone]   Fax:  [phone]  Physical Therapy Treatment  Patient Details  Name: Karen Bonilla MRN: [id number] Date of Birth: [birth year] Referring Provider (PT): Leighton Ruff, MD   Encounter Date: 03/10/2021   PT End of Session - 03/10/21 1102    Visit Number 15    Date for PT Re-Evaluation 04/21/21    Authorization Type Cohere: 8 visits 02/24/21-04/18/21    Authorization - Visit Number 5    Authorization - Number of Visits 8    Progress Note Due on Visit 20    PT Start Time 1020    PT Stop Time 1100    PT Time Calculation (min) 40 min    Activity Tolerance Patient tolerated treatment well    Behavior During Therapy [facility] for tasks assessed/performed           Past Medical History:  Diagnosis Date  . Anemia   . Basal cell carcinoma 09/01/2002   right shin tx exc  . BCC (basal cell carcinoma of skin) 06/20/2013   right forearm TX WITH BX  . BCC (basal cell carcinoma of skin) 06/20/2013   right upper lip TX WITH BX  . BCC (basal cell carcinoma of skin) 10/03/2016   mid upper back [place]  . Colon polyps   . COPD (chronic obstructive pulmonary disease) ([place])   . Hyperlipidemia   . Mucoid cyst of joint   . SCC (squamous cell carcinoma) 01/23/2011   left forearm scc/ka  TX CX3 5FU CAUTERY  . SCC (squamous cell carcinoma) 10/03/2016   right shin scc/ka TX CX3 5FU CAUTERY  . SCC (squamous cell carcinoma) 07/29/2019   left forehead scc TX CX3 5FU CAUTERY  . Spinal stenosis   . Squamous cell carcinoma of skin 10/23/2005   left lower shin inf. bowens  TX CX3 5FU CAUTERY  . Squamous cell carcinoma of skin 08/06/2020   in situ- right lower leg (CX35FU)  . Squamous cell carcinoma of skin 08/06/2020   in situ- right lower leg- posterior lower (CX35FU)  . Varicose veins of lower extremities with other complications 5/85/2778    Past Surgical  History:  Procedure Laterality Date  . ABDOMINAL HYSTERECTOMY    . MASS EXCISION  01/11/2012   Procedure: MINOR EXCISION OF MASS;  Surgeon: Cammie Sickle., MD;  Location: [facility];  Service: Orthopedics;  Laterality: Right;  excision mucoid cyst right index and dip joint debridement    There were no vitals filed for this visit.   Subjective Assessment - 03/10/21 1026    Subjective I'm hurting.  I haven't been doing anything for 5 days due to pain.  I did go to my water class on Monday.    Currently in Pain? Yes    Pain Score 5     Pain Location Back    Pain Orientation Left    Pain Descriptors / Indicators Aching    Pain Type Chronic pain    Pain Onset More than a month ago    Pain Frequency Intermittent    Aggravating Factors  getting up and down from a chair or bed, bending over    Pain Relieving Factors Lidocane patch, readjust myself                             [facility] Adult PT Treatment/Exercise - 03/10/21  0001      Knee/Hip Exercises: Stretches   Piriformis Stretch Both;30 seconds;3 reps    Piriformis Stretch Limitations seated    Other Knee/Hip Stretches butterfly stretch 2 x 20s and SDLY clam 2 x 10      Knee/Hip Exercises: Aerobic   Nustep L2 x 6 min- PT present to discuss progress      Knee/Hip Exercises: Seated   Other Seated Knee/Hip Exercises rows green tband seated on table with black foam      Knee/Hip Exercises: Supine   Other Supine Knee/Hip Exercises hip ADD squeeze ball 3s x 20    Other Supine Knee/Hip Exercises hip ABD yellow loop   glute sets 15x3s hold; - cueing for abdominal bracing and glute activation     Manual Therapy   Manual Therapy Myofascial release    Manual therapy comments Addaday to Lt lumbar and gluteals                    PT Short Term Goals - 01/03/21 1622      PT SHORT TERM GOAL #1   Title independent with initial HEP    Status Achieved      PT SHORT TERM GOAL #2   Title report  < or = to 4/10 Lt hip/LE pain when coming from sit to stand and getting out of the car    Baseline 4/10 max    Status Achieved      PT SHORT TERM GOAL #3   Title report a 30% reduction in the frequency and intensity of Lt hip/leg pain with change of position and walking    Status Achieved             PT Long Term Goals - 02/22/21 1157      PT LONG TERM GOAL #1   Title Independent with HEP    Status Achieved      PT LONG TERM GOAL #2   Title report < or = to 2/10 Lt hip pain with sit to stand and getting out of the car    Status Achieved      PT LONG TERM GOAL #3   Title perform 5x sit to stand in < or = to 13 seconds to reduce falls risk    Baseline 11.48    Status Achieved      PT LONG TERM GOAL #4   Title report a 90% reduction in the frequency and intensity of Lt hip pain with change of position and walking    Baseline Patient reports >= 90% reduction    Status Achieved      PT LONG TERM GOAL #5   Title demonstate 4+/5 Lt hip strength to improve stability and endurance with transitions and community activity    Status Achieved      Additional Long Term Goals   Additional Long Term Goals Yes      PT LONG TERM GOAL #6   Title Patient able to ambulate without forward trunk lean for >= 25 steps at her normal cadence.    Baseline 10 steps    Time 8    Period Weeks    Status New      PT LONG TERM GOAL #7   Title Patient to demo negative Thomas test bil to assist with normal gait pattern.    Time 8    Period Weeks    Status New  Plan - 03/10/21 1036    Clinical Impression Statement Pt arrived with 5/10 Lt sided LBP and this has lasted the past 5 days.  Pt reports that she has not been doing her HEP due to pain.  PT encouraged pt to resume gentle flexibility and mobility exercises at home and perform to tolerance.  Session focused on gentle mobility and manual to address pain today.  PT monitored for pain throughout session.  Patient reported  decreased lumbar pain at end of session. Pt will benefit from continued skilled intervention to address impairments for improved functional mobility and decreased pain.    Rehab Potential Good    PT Frequency 2x / week    PT Duration 4 weeks    PT Treatment/Interventions ADLs/Self Care Home Management;Cryotherapy;Electrical Stimulation;Moist Heat;Iontophoresis 4mg /ml Dexamethasone;Stair training;Gait training;Functional mobility training;Therapeutic activities;Therapeutic exercise;Balance training;Neuromuscular re-education;Manual techniques;Patient/family education;Passive range of motion;Dry needling;Taping    PT Next Visit Plan continue focus on trunk extensor strengthening, gait, and iliopsoas flexibility, advance activity as tolerated    PT Home Exercise Plan Access Code: [email or web address]    Consulted and Agree with Plan of Care Patient           Patient will benefit from skilled therapeutic intervention in order to improve the following deficits and impairments:  Abnormal gait,Decreased activity tolerance,Decreased balance,Postural dysfunction,Decreased strength,Impaired flexibility,Improper body mechanics,Pain,Decreased endurance,Increased muscle spasms,Difficulty walking,Decreased range of motion  Visit Diagnosis: Other abnormalities of gait and mobility  Pain in left hip  Muscle weakness (generalized)     Problem List Patient Active Problem List   Diagnosis Date Noted  . Anosmia 02/02/2021  . Degeneration of lumbar intervertebral disc 02/02/2021  . Hardening of the aorta (main artery of the heart) ([place]) 02/02/2021  . Hypercholesterolemia 02/02/2021  . Other seasonal allergic rhinitis 02/02/2021  . Other specified disorders of bone density and structure, other site 02/02/2021  . Personal history of colonic polyps 02/02/2021  . Chronic obstructive pulmonary disease ([place]) 02/02/2021  . Senile purpura ([place]) 02/02/2021  . Spinal stenosis 02/02/2021  . Abnormal findings on diagnostic  imaging of other specified body structures 02/02/2021  . Coronary artery calcification 03/19/2018  . Ganglion of left wrist 03/07/2017  . Varicose veins of lower extremities with other complications 72/08/4708    Sigurd Sos, PT 03/10/21 11:03 AM  [facility] [address] [place], [place], [address] Phone: [phone]   Fax:  [phone]  Name: REHAM SLABAUGH MRN: [id number] Date of Birth: August 31, 1941

## 2021-03-15 ENCOUNTER — Other Ambulatory Visit: Payer: Self-pay

## 2021-03-15 ENCOUNTER — Ambulatory Visit: Payer: Medicare PPO | Admitting: Physical Therapy

## 2021-03-15 DIAGNOSIS — R2689 Other abnormalities of gait and mobility: Secondary | ICD-10-CM | POA: Diagnosis not present

## 2021-03-15 DIAGNOSIS — M25552 Pain in left hip: Secondary | ICD-10-CM | POA: Diagnosis not present

## 2021-03-15 DIAGNOSIS — M6281 Muscle weakness (generalized): Secondary | ICD-10-CM

## 2021-03-15 NOTE — Therapy (Signed)
[facility] [address], [place], [place], [address] Phone: [phone]   Fax:  [phone]  Physical Therapy Treatment  Patient Details  Name: Karen Bonilla MRN: [id number] Date of Birth: [birth year] Referring Provider (PT): Leighton Ruff, MD   Encounter Date: 03/15/2021   PT End of Session - 03/15/21 1344    Visit Number 16    Number of Visits 21    Date for PT Re-Evaluation 04/21/21    Authorization Type Cohere: 8 visits 02/24/21-04/18/21    Authorization - Visit Number 6    Authorization - Number of Visits 8    Progress Note Due on Visit 20    PT Start Time 1230    PT Stop Time 1314    PT Time Calculation (min) 44 min    Activity Tolerance Patient tolerated treatment well           Past Medical History:  Diagnosis Date  . Anemia   . Basal cell carcinoma 09/01/2002   right shin tx exc  . BCC (basal cell carcinoma of skin) 06/20/2013   right forearm TX WITH BX  . BCC (basal cell carcinoma of skin) 06/20/2013   right upper lip TX WITH BX  . BCC (basal cell carcinoma of skin) 10/03/2016   mid upper back [place]  . Colon polyps   . COPD (chronic obstructive pulmonary disease) ([place])   . Hyperlipidemia   . Mucoid cyst of joint   . SCC (squamous cell carcinoma) 01/23/2011   left forearm scc/ka  TX CX3 5FU CAUTERY  . SCC (squamous cell carcinoma) 10/03/2016   right shin scc/ka TX CX3 5FU CAUTERY  . SCC (squamous cell carcinoma) 07/29/2019   left forehead scc TX CX3 5FU CAUTERY  . Spinal stenosis   . Squamous cell carcinoma of skin 10/23/2005   left lower shin inf. bowens  TX CX3 5FU CAUTERY  . Squamous cell carcinoma of skin 08/06/2020   in situ- right lower leg (CX35FU)  . Squamous cell carcinoma of skin 08/06/2020   in situ- right lower leg- posterior lower (CX35FU)  . Varicose veins of lower extremities with other complications 3/87/5643    Past Surgical History:  Procedure Laterality Date   . ABDOMINAL HYSTERECTOMY    . MASS EXCISION  01/11/2012   Procedure: MINOR EXCISION OF MASS;  Surgeon: Cammie Sickle., MD;  Location: [facility];  Service: Orthopedics;  Laterality: Right;  excision mucoid cyst right index and dip joint debridement    There were no vitals filed for this visit.   Subjective Assessment - 03/15/21 1234    Subjective My back is more of an ache and not excruciating now.  I've been trying to move around more.  I went to the pool.    Pertinent History DDD and spinal stenosis of the lumbar spine, COPD    How long can you stand comfortably? limitations are due to chronic LBP    Currently in Pain? Yes    Pain Score 3     Pain Location Back    Pain Orientation Left    Pain Type Chronic pain                             [facility] Adult PT Treatment/Exercise - 03/15/21 0001      Therapeutic Activites    Therapeutic Activities ADL's    ADL's log roll with bed mobility  Knee/Hip Exercises: Seated   Other Seated Knee/Hip Exercises encouraged continued compliance with HEP for      Moist Heat Therapy   Number Minutes Moist Heat 3 Minutes   prone   Moist Heat Location Lumbar Spine      Electrical Stimulation   Electrical Stimulation Location bil lumbar paraspinals with DN    Electrical Stimulation Action 1.5 Volts    Electrical Stimulation Parameters 8 min    Electrical Stimulation Goals Pain      Manual Therapy   Soft tissue mobilization bil lumbar paraspinal s            Trigger Point Dry Needling - 03/15/21 0001    Consent Given? Yes    Muscles Treated Back/Hip Lumbar multifidi    Dry Needling Comments bil    Electrical Stimulation Performed with Dry Needling Yes    Lumbar multifidi Response Palpable increased muscle length                  PT Short Term Goals - 01/03/21 1622      PT SHORT TERM GOAL #1   Title independent with initial HEP    Status Achieved      PT SHORT TERM GOAL #2   Title  report < or = to 4/10 Lt hip/LE pain when coming from sit to stand and getting out of the car    Baseline 4/10 max    Status Achieved      PT SHORT TERM GOAL #3   Title report a 30% reduction in the frequency and intensity of Lt hip/leg pain with change of position and walking    Status Achieved             PT Long Term Goals - 02/22/21 1157      PT LONG TERM GOAL #1   Title Independent with HEP    Status Achieved      PT LONG TERM GOAL #2   Title report < or = to 2/10 Lt hip pain with sit to stand and getting out of the car    Status Achieved      PT LONG TERM GOAL #3   Title perform 5x sit to stand in < or = to 13 seconds to reduce falls risk    Baseline 11.48    Status Achieved      PT LONG TERM GOAL #4   Title report a 90% reduction in the frequency and intensity of Lt hip pain with change of position and walking    Baseline Patient reports >= 90% reduction    Status Achieved      PT LONG TERM GOAL #5   Title demonstate 4+/5 Lt hip strength to improve stability and endurance with transitions and community activity    Status Achieved      Additional Long Term Goals   Additional Long Term Goals Yes      PT LONG TERM GOAL #6   Title Patient able to ambulate without forward trunk lean for >= 25 steps at her normal cadence.    Baseline 10 steps    Time 8    Period Weeks    Status New      PT LONG TERM GOAL #7   Title Patient to demo negative Thomas test bil to assist with normal gait pattern.    Time 8    Period Weeks    Status New  Plan - 03/15/21 1345    Clinical Impression Statement The patient reports her groin pain is signficantly better but her central LBP has been up/down and especially bothersome with turning and getting in/out of bed. Instructed in log rolling technique for bed mobility.   She has had benefit from DN in the past for thigh pain and she is receptive to trying it for treating lumbar paraspinal and multifidi region  pain.  Added ES with DN for further benefit.  She reports good relief during the treatment.  Encouraged continuation of HEP for best prognosis and outcomes.    Comorbidities lumbar stenosis, DDD and COPD    Stability/Clinical Decision Making Evolving/Moderate complexity    Rehab Potential Good    PT Frequency 2x / week    PT Duration 4 weeks    PT Treatment/Interventions ADLs/Self Care Home Management;Cryotherapy;Electrical Stimulation;Moist Heat;Iontophoresis 4mg /ml Dexamethasone;Stair training;Gait training;Functional mobility training;Therapeutic activities;Therapeutic exercise;Balance training;Neuromuscular re-education;Manual techniques;Patient/family education;Passive range of motion;Dry needling;Taping    PT Next Visit Plan assess response to DN/ES lumbar multifidi;  trunk extensore strengthening; flexibility    PT Home Exercise Plan Access Code: [email or web address]           Patient will benefit from skilled therapeutic intervention in order to improve the following deficits and impairments:  Abnormal gait,Decreased activity tolerance,Decreased balance,Postural dysfunction,Decreased strength,Impaired flexibility,Improper body mechanics,Pain,Decreased endurance,Increased muscle spasms,Difficulty walking,Decreased range of motion  Visit Diagnosis: Other abnormalities of gait and mobility  Pain in left hip  Muscle weakness (generalized)     Problem List Patient Active Problem List   Diagnosis Date Noted  . Anosmia 02/02/2021  . Degeneration of lumbar intervertebral disc 02/02/2021  . Hardening of the aorta (main artery of the heart) ([place]) 02/02/2021  . Hypercholesterolemia 02/02/2021  . Other seasonal allergic rhinitis 02/02/2021  . Other specified disorders of bone density and structure, other site 02/02/2021  . Personal history of colonic polyps 02/02/2021  . Chronic obstructive pulmonary disease ([place]) 02/02/2021  . Senile purpura ([place]) 02/02/2021  . Spinal stenosis 02/02/2021  .  Abnormal findings on diagnostic imaging of other specified body structures 02/02/2021  . Coronary artery calcification 03/19/2018  . Ganglion of left wrist 03/07/2017  . Varicose veins of lower extremities with other complications 63/87/5643   Ruben Im, PT 03/15/21 1:51 PM Phone: [phone] Fax: [phone] [name] 03/15/2021, 1:50 PM  [facility] [address], [place], [place], [address] Phone: [phone]   Fax:  [phone]  Name: Karen Bonilla MRN: [id number] Date of Birth: January 05, 1941

## 2021-03-17 ENCOUNTER — Other Ambulatory Visit: Payer: Self-pay

## 2021-03-17 ENCOUNTER — Ambulatory Visit: Payer: Medicare PPO | Admitting: Physical Therapy

## 2021-03-17 DIAGNOSIS — R2689 Other abnormalities of gait and mobility: Secondary | ICD-10-CM

## 2021-03-17 DIAGNOSIS — M6281 Muscle weakness (generalized): Secondary | ICD-10-CM

## 2021-03-17 DIAGNOSIS — M25552 Pain in left hip: Secondary | ICD-10-CM

## 2021-03-17 NOTE — Therapy (Signed)
[facility] [address], [place], [place], [address] Phone: [phone]   Fax:  [phone]  Physical Therapy Treatment  Patient Details  Name: Karen Bonilla MRN: [id number] Date of Birth: [birth year] Referring Provider (PT): Leighton Ruff, MD   Encounter Date: 03/17/2021   PT End of Session - 03/17/21 1312    Visit Number 17    Number of Visits 21    Date for PT Re-Evaluation 04/21/21    Authorization Type Cohere: 8 visits 02/24/21-04/18/21    Authorization - Visit Number 7    Authorization - Number of Visits 8    Progress Note Due on Visit 20    PT Start Time 1230    PT Stop Time 1315    PT Time Calculation (min) 45 min    Activity Tolerance Patient tolerated treatment well           Past Medical History:  Diagnosis Date  . Anemia   . Basal cell carcinoma 09/01/2002   right shin tx exc  . BCC (basal cell carcinoma of skin) 06/20/2013   right forearm TX WITH BX  . BCC (basal cell carcinoma of skin) 06/20/2013   right upper lip TX WITH BX  . BCC (basal cell carcinoma of skin) 10/03/2016   mid upper back [place]  . Colon polyps   . COPD (chronic obstructive pulmonary disease) ([place])   . Hyperlipidemia   . Mucoid cyst of joint   . SCC (squamous cell carcinoma) 01/23/2011   left forearm scc/ka  TX CX3 5FU CAUTERY  . SCC (squamous cell carcinoma) 10/03/2016   right shin scc/ka TX CX3 5FU CAUTERY  . SCC (squamous cell carcinoma) 07/29/2019   left forehead scc TX CX3 5FU CAUTERY  . Spinal stenosis   . Squamous cell carcinoma of skin 10/23/2005   left lower shin inf. bowens  TX CX3 5FU CAUTERY  . Squamous cell carcinoma of skin 08/06/2020   in situ- right lower leg (CX35FU)  . Squamous cell carcinoma of skin 08/06/2020   in situ- right lower leg- posterior lower (CX35FU)  . Varicose veins of lower extremities with other complications 8/45/3646    Past Surgical History:  Procedure Laterality Date   . ABDOMINAL HYSTERECTOMY    . MASS EXCISION  01/11/2012   Procedure: MINOR EXCISION OF MASS;  Surgeon: Cammie Sickle., MD;  Location: [facility];  Service: Orthopedics;  Laterality: Right;  excision mucoid cyst right index and dip joint debridement    There were no vitals filed for this visit.   Subjective Assessment - 03/17/21 1234    Subjective I felt great after DN, I could stand up straight and felt really good but it didn't last.  As soon as I lay down in the bed it hurt really bad which is unusual for me to hurt when I lie down.  I usually sleep well.    It has subsided some this morning when I got up.  I had to log roll to maneuver.    Pertinent History DDD and spinal stenosis of the lumbar spine, COPD    How long can you stand comfortably? limitations are due to chronic LBP    Currently in Pain? Yes    Pain Score 3     Pain Location Back    Pain Orientation Left    Pain Type Chronic pain  [place] Adult PT Treatment/Exercise - 03/17/21 0001      Lumbar Exercises: Standing   Other Standing Lumbar Exercises glute sets/abdominal set 10x      Lumbar Exercises: Seated   Other Seated Lumbar Exercises glute sets and adductor squeeze 10x    Other Seated Lumbar Exercises chair back push down 10x      Lumbar Exercises: Supine   Other Supine Lumbar Exercises green band knee bands with press out and down 10x right/left   after 10 reps on left the patient has sudden sharp pain left lumbo sacral region     Electrical Stimulation   Electrical Stimulation Location bil lumbar paraspinals    Electrical Stimulation Action 7 ma 10 min prone    Electrical Stimulation Parameters 10 min    Electrical Stimulation Goals Pain                  PT Education - 03/17/21 1325    Education Details home TENs info    Person(s) Educated Patient    Methods Explanation;Demonstration;Handout    Comprehension Returned  demonstration;Verbalized understanding            PT Short Term Goals - 01/03/21 1622      PT SHORT TERM GOAL #1   Title independent with initial HEP    Status Achieved      PT SHORT TERM GOAL #2   Title report < or = to 4/10 Lt hip/LE pain when coming from sit to stand and getting out of the car    Baseline 4/10 max    Status Achieved      PT SHORT TERM GOAL #3   Title report a 30% reduction in the frequency and intensity of Lt hip/leg pain with change of position and walking    Status Achieved             PT Long Term Goals - 02/22/21 1157      PT LONG TERM GOAL #1   Title Independent with HEP    Status Achieved      PT LONG TERM GOAL #2   Title report < or = to 2/10 Lt hip pain with sit to stand and getting out of the car    Status Achieved      PT LONG TERM GOAL #3   Title perform 5x sit to stand in < or = to 13 seconds to reduce falls risk    Baseline 11.48    Status Achieved      PT LONG TERM GOAL #4   Title report a 90% reduction in the frequency and intensity of Lt hip pain with change of position and walking    Baseline Patient reports >= 90% reduction    Status Achieved      PT LONG TERM GOAL #5   Title demonstate 4+/5 Lt hip strength to improve stability and endurance with transitions and community activity    Status Achieved      Additional Long Term Goals   Additional Long Term Goals Yes      PT LONG TERM GOAL #6   Title Patient able to ambulate without forward trunk lean for >= 25 steps at her normal cadence.    Baseline 10 steps    Time 8    Period Weeks    Status New      PT LONG TERM GOAL #7   Title Patient to demo negative Thomas test bil to assist with normal gait pattern.  Time 8    Period Weeks    Status New                 Plan - 03/17/21 1910    Clinical Impression Statement The patient reports mixed results following last treatment with DN coupled with ES to lumbar multifidi.  Initially she states she had excellent  relief and was able to walk fully erect for a while afterwards however that night she had increased pain lying down and difficulty sleeping.  She reports she is back to baseline this morning.  Initiated treatment session today with supine green band resisted leg press which she performed easily on the right and after numerous reps on the left she suddenly yelled out in response to sharp pain in PSIS region on the left.  Following that encouraged low intensity isometrics including abdominal brace, glute squeeze and hip adductor squeezes in sidelying, seated and standing which she was able to peform without pain exacerbation.  ES and heat applied to lumbar region for further pain relief.  She chose prone lying as the most comfortable position today.  She is interested in potentially getting a home TEN and she was given info on an affordable option to order.    Comorbidities lumbar stenosis, DDD and COPD    Examination-Activity Limitations Sit;Squat;Stand;Transfers    Rehab Potential Good    PT Frequency 2x / week    PT Duration 4 weeks    PT Treatment/Interventions ADLs/Self Care Home Management;Cryotherapy;Electrical Stimulation;Moist Heat;Iontophoresis 4mg /ml Dexamethasone;Stair training;Gait training;Functional mobility training;Therapeutic activities;Therapeutic exercise;Balance training;Neuromuscular re-education;Manual techniques;Patient/family education;Passive range of motion;Dry needling;Taping    PT Next Visit Plan mixed results with DN/ES;  consider reassessment on progress toward goals (may be at max of currently approved appts per insurance)    PT Home Exercise Plan Access Code: [email or web address]           Patient will benefit from skilled therapeutic intervention in order to improve the following deficits and impairments:  Abnormal gait,Decreased activity tolerance,Decreased balance,Postural dysfunction,Decreased strength,Impaired flexibility,Improper body mechanics,Pain,Decreased  endurance,Increased muscle spasms,Difficulty walking,Decreased range of motion  Visit Diagnosis: Other abnormalities of gait and mobility  Pain in left hip  Muscle weakness (generalized)     Problem List Patient Active Problem List   Diagnosis Date Noted  . Anosmia 02/02/2021  . Degeneration of lumbar intervertebral disc 02/02/2021  . Hardening of the aorta (main artery of the heart) ([place]) 02/02/2021  . Hypercholesterolemia 02/02/2021  . Other seasonal allergic rhinitis 02/02/2021  . Other specified disorders of bone density and structure, other site 02/02/2021  . Personal history of colonic polyps 02/02/2021  . Chronic obstructive pulmonary disease ([place]) 02/02/2021  . Senile purpura ([place]) 02/02/2021  . Spinal stenosis 02/02/2021  . Abnormal findings on diagnostic imaging of other specified body structures 02/02/2021  . Coronary artery calcification 03/19/2018  . Ganglion of left wrist 03/07/2017  . Varicose veins of lower extremities with other complications 38/09/1750   Ruben Im, PT 03/17/21 7:22 PM Phone: [phone] Fax: [phone] [name] 03/17/2021, 7:21 PM  [facility] [address], [place], [place], [address] Phone: [phone]   Fax:  [phone]  Name: Karen Bonilla MRN: [id number] Date of Birth: 1941-05-05

## 2021-03-17 NOTE — Patient Instructions (Signed)

## 2021-03-22 ENCOUNTER — Other Ambulatory Visit: Payer: Self-pay

## 2021-03-22 ENCOUNTER — Ambulatory Visit: Payer: Medicare PPO

## 2021-03-22 DIAGNOSIS — M6281 Muscle weakness (generalized): Secondary | ICD-10-CM

## 2021-03-22 DIAGNOSIS — M25552 Pain in left hip: Secondary | ICD-10-CM | POA: Diagnosis not present

## 2021-03-22 DIAGNOSIS — R2689 Other abnormalities of gait and mobility: Secondary | ICD-10-CM

## 2021-03-22 NOTE — Therapy (Signed)
[facility] [address] [place], [place], [address] Phone: [phone]   Fax:  [phone]  Physical Therapy Treatment  Patient Details  Name: Karen Bonilla MRN: [id number] Date of Birth: October 19, 1941 Referring Provider (PT): Leighton Ruff, MD   Encounter Date: 03/22/2021   PT End of Session - 03/22/21 1304    Visit Number 18    Date for PT Re-Evaluation 04/21/21    Authorization Type [employer]: 8 visits 02/24/21-04/18/21    Authorization - Visit Number 5    Authorization - Number of Visits 8    Progress Note Due on Visit 20    PT Start Time 1324    PT Stop Time 1317    PT Time Calculation (min) 42 min    Activity Tolerance Patient tolerated treatment well    Behavior During Therapy [facility] for tasks assessed/performed           Past Medical History:  Diagnosis Date  . Anemia   . Basal cell carcinoma 09/01/2002   right shin tx exc  . BCC (basal cell carcinoma of skin) 06/20/2013   right forearm TX WITH BX  . BCC (basal cell carcinoma of skin) 06/20/2013   right upper lip TX WITH BX  . BCC (basal cell carcinoma of skin) 10/03/2016   mid upper back [place]  . Colon polyps   . COPD (chronic obstructive pulmonary disease) ([place])   . Hyperlipidemia   . Mucoid cyst of joint   . SCC (squamous cell carcinoma) 01/23/2011   left forearm scc/ka  TX CX3 5FU CAUTERY  . SCC (squamous cell carcinoma) 10/03/2016   right shin scc/ka TX CX3 5FU CAUTERY  . SCC (squamous cell carcinoma) 07/29/2019   left forehead scc TX CX3 5FU CAUTERY  . Spinal stenosis   . Squamous cell carcinoma of skin 10/23/2005   left lower shin inf. bowens  TX CX3 5FU CAUTERY  . Squamous cell carcinoma of skin 08/06/2020   in situ- right lower leg (CX35FU)  . Squamous cell carcinoma of skin 08/06/2020   in situ- right lower leg- posterior lower (CX35FU)  . Varicose veins of lower extremities with other complications 03/26/271    Past Surgical  History:  Procedure Laterality Date  . ABDOMINAL HYSTERECTOMY    . MASS EXCISION  01/11/2012   Procedure: MINOR EXCISION OF MASS;  Surgeon: Cammie Sickle., MD;  Location: [facility];  Service: Orthopedics;  Laterality: Right;  excision mucoid cyst right index and dip joint debridement    There were no vitals filed for this visit.   Subjective Assessment - 03/22/21 1238    Subjective The dry needling and TENs have helped me a lot.    Currently in Pain? Yes    Pain Score 1     Pain Location Back    Pain Orientation Left    Pain Descriptors / Indicators Aching    Pain Type Chronic pain    Pain Onset More than a month ago    Aggravating Factors  getting up and down from a chair, bending over    Pain Relieving Factors Lidocane patch, dry needling, electrical stimulation                             [facility] Adult PT Treatment/Exercise - 03/22/21 0001      Electrical Stimulation   Electrical Stimulation Location bil lumbar paraspinals    Electrical  Stimulation Action IFC    Electrical Stimulation Parameters 15 minutes    Electrical Stimulation Goals Pain      Manual Therapy   Manual Therapy Myofascial release    Soft tissue mobilization bil lumbar paraspinals            Trigger Point Dry Needling - 03/22/21 0001    Consent Given? Yes    Muscles Treated Back/Hip Lumbar multifidi;Gluteus minimus;Gluteus medius    Dry Needling Comments bil lumbar, Lt gluteals    Gluteus Minimus Response Twitch response elicited;Palpable increased muscle length    Gluteus Medius Response Twitch response elicited;Palpable increased muscle length    Lumbar multifidi Response Palpable increased muscle length                  PT Short Term Goals - 01/03/21 1622      PT SHORT TERM GOAL #1   Title independent with initial HEP    Status Achieved      PT SHORT TERM GOAL #2   Title report < or = to 4/10 Lt hip/LE pain when coming from sit to stand and  getting out of the car    Baseline 4/10 max    Status Achieved      PT SHORT TERM GOAL #3   Title report a 30% reduction in the frequency and intensity of Lt hip/leg pain with change of position and walking    Status Achieved             PT Long Term Goals - 02/22/21 1157      PT LONG TERM GOAL #1   Title Independent with HEP    Status Achieved      PT LONG TERM GOAL #2   Title report < or = to 2/10 Lt hip pain with sit to stand and getting out of the car    Status Achieved      PT LONG TERM GOAL #3   Title perform 5x sit to stand in < or = to 13 seconds to reduce falls risk    Baseline 11.48    Status Achieved      PT LONG TERM GOAL #4   Title report a 90% reduction in the frequency and intensity of Lt hip pain with change of position and walking    Baseline Patient reports >= 90% reduction    Status Achieved      PT LONG TERM GOAL #5   Title demonstate 4+/5 Lt hip strength to improve stability and endurance with transitions and community activity    Status Achieved      Additional Long Term Goals   Additional Long Term Goals Yes      PT LONG TERM GOAL #6   Title Patient able to ambulate without forward trunk lean for >= 25 steps at her normal cadence.    Baseline 10 steps    Time 8    Period Weeks    Status New      PT LONG TERM GOAL #7   Title Patient to demo negative Thomas test bil to assist with normal gait pattern.    Time 8    Period Weeks    Status New                 Plan - 03/22/21 1309    Clinical Impression Statement Pt arrived without significant pain today, just muscle tension.  She has felt fine sine last session after electrical simulation at the end of the  session.  Pt has been avoiding supine exercise due to fear of increased pain as occurred last visit.  PT reviewed exercises verbally with pt during manual therapy and advised pt to begin to work back into supine exercise slowly as pain allows.  Pt with tension and trigger points in  Lt> Rt lumbar paraspinals and quadratus.  Pt had multiple twitch responses and demonstrated improved tissue mobility after manual therapy today.  Pt will attend 3 more sessions to finalize HEP and address pain and tissue mobility.    PT Frequency 2x / week    PT Duration 4 weeks    PT Treatment/Interventions ADLs/Self Care Home Management;Cryotherapy;Electrical Stimulation;Moist Heat;Iontophoresis 4mg /ml Dexamethasone;Stair training;Gait training;Functional mobility training;Therapeutic activities;Therapeutic exercise;Balance training;Neuromuscular re-education;Manual techniques;Patient/family education;Passive range of motion;Dry needling;Taping    PT Next Visit Plan activity as tolerated, manual therapy to address tension and pain, e-stim as tolerated.  3 more visits probable    PT Home Exercise Plan Access Code: [email or web address]    Recommended Other Services recert is signed    Consulted and Agree with Plan of Care Patient           Patient will benefit from skilled therapeutic intervention in order to improve the following deficits and impairments:  Abnormal gait,Decreased activity tolerance,Decreased balance,Postural dysfunction,Decreased strength,Impaired flexibility,Improper body mechanics,Pain,Decreased endurance,Increased muscle spasms,Difficulty walking,Decreased range of motion  Visit Diagnosis: Pain in left hip  Other abnormalities of gait and mobility  Muscle weakness (generalized)     Problem List Patient Active Problem List   Diagnosis Date Noted  . Anosmia 02/02/2021  . Degeneration of lumbar intervertebral disc 02/02/2021  . Hardening of the aorta (main artery of the heart) ([place]) 02/02/2021  . Hypercholesterolemia 02/02/2021  . Other seasonal allergic rhinitis 02/02/2021  . Other specified disorders of bone density and structure, other site 02/02/2021  . Personal history of colonic polyps 02/02/2021  . Chronic obstructive pulmonary disease ([place]) 02/02/2021  . Senile  purpura ([place]) 02/02/2021  . Spinal stenosis 02/02/2021  . Abnormal findings on diagnostic imaging of other specified body structures 02/02/2021  . Coronary artery calcification 03/19/2018  . Ganglion of left wrist 03/07/2017  . Varicose veins of lower extremities with other complications 40/81/4481     Sigurd Sos, PT 03/22/21 1:11 PM  [facility] [address], [place], [place], [address] Phone: [phone]   Fax:  [phone]  Name: KALIOPE QUINONEZ MRN: [id number] Date of Birth: 16-Jun-1941

## 2021-03-29 ENCOUNTER — Encounter: Payer: Medicare PPO | Admitting: Physical Therapy

## 2021-04-04 ENCOUNTER — Ambulatory Visit: Payer: Medicare PPO | Admitting: Dermatology

## 2021-04-05 ENCOUNTER — Ambulatory Visit: Payer: Medicare PPO | Attending: Family Medicine | Admitting: Physical Therapy

## 2021-04-05 ENCOUNTER — Other Ambulatory Visit: Payer: Self-pay

## 2021-04-05 DIAGNOSIS — M6281 Muscle weakness (generalized): Secondary | ICD-10-CM

## 2021-04-05 DIAGNOSIS — R2689 Other abnormalities of gait and mobility: Secondary | ICD-10-CM | POA: Diagnosis not present

## 2021-04-05 DIAGNOSIS — M25552 Pain in left hip: Secondary | ICD-10-CM

## 2021-04-05 NOTE — Therapy (Signed)
[facility] [address] [place], [place], [address] Phone: [phone]   Fax:  [phone]  Physical Therapy Treatment  Patient Details  Name: Karen Bonilla MRN: [id number] Date of Birth: 1941/04/14 Referring Provider (PT): Leighton Ruff, MD   Encounter Date: 04/05/2021   PT End of Session - 04/05/21 1212    Visit Number 19    Number of Visits 21    Date for PT Re-Evaluation 04/21/21    Authorization Type Cohere: 8 visits 02/24/21-04/18/21    Authorization - Visit Number 6    Authorization - Number of Visits 8    Progress Note Due on Visit 20    PT Start Time 1152    PT Stop Time 1228   DN   PT Time Calculation (min) 36 min    Activity Tolerance Patient tolerated treatment well           Past Medical History:  Diagnosis Date  . Anemia   . Basal cell carcinoma 09/01/2002   right shin tx exc  . BCC (basal cell carcinoma of skin) 06/20/2013   right forearm TX WITH BX  . BCC (basal cell carcinoma of skin) 06/20/2013   right upper lip TX WITH BX  . BCC (basal cell carcinoma of skin) 10/03/2016   mid upper back [place]  . Colon polyps   . COPD (chronic obstructive pulmonary disease) ([place])   . Hyperlipidemia   . Mucoid cyst of joint   . SCC (squamous cell carcinoma) 01/23/2011   left forearm scc/ka  TX CX3 5FU CAUTERY  . SCC (squamous cell carcinoma) 10/03/2016   right shin scc/ka TX CX3 5FU CAUTERY  . SCC (squamous cell carcinoma) 07/29/2019   left forehead scc TX CX3 5FU CAUTERY  . Spinal stenosis   . Squamous cell carcinoma of skin 10/23/2005   left lower shin inf. bowens  TX CX3 5FU CAUTERY  . Squamous cell carcinoma of skin 08/06/2020   in situ- right lower leg (CX35FU)  . Squamous cell carcinoma of skin 08/06/2020   in situ- right lower leg- posterior lower (CX35FU)  . Varicose veins of lower extremities with other complications 0/63/0160    Past Surgical History:  Procedure Laterality  Date  . ABDOMINAL HYSTERECTOMY    . MASS EXCISION  01/11/2012   Procedure: MINOR EXCISION OF MASS;  Surgeon: Cammie Sickle., MD;  Location: [facility];  Service: Orthopedics;  Laterality: Right;  excision mucoid cyst right index and dip joint debridement    There were no vitals filed for this visit.   Subjective Assessment - 04/05/21 1200    Subjective No more acute back pain pain or groin pain.  Front thigh pain.  Back still aches though but not crippling.    I haven't gotten my TENS unit yet.  My shoulder feels better after gardening yesterday.  Doing butterfly and seated piriformis stretches.  Doing aquatic ex.  I sat too much at the beach.    Pertinent History DDD and spinal stenosis of the lumbar spine, COPD    Currently in Pain? Yes    Pain Score 2     Pain Location Back    Pain Orientation Left                             [facility] Adult PT Treatment/Exercise - 04/05/21 0001      Acupuncturist  Stimulation Location bil lumbar paraspinals    Electrical Stimulation Goals Pain      Manual Therapy   Manual Therapy Myofascial release    Soft tissue mobilization bil lumbar paraspinals      Kinesiotix   Facilitate Muscle  star pattern            Trigger Point Dry Needling - 04/05/21 0001    Consent Given? Yes    Muscles Treated Back/Hip Lumbar multifidi    Dry Needling Comments bil    Electrical Stimulation Performed with Dry Needling Yes    Lumbar multifidi Response Palpable increased muscle length                  PT Short Term Goals - 01/03/21 1622      PT SHORT TERM GOAL #1   Title independent with initial HEP    Status Achieved      PT SHORT TERM GOAL #2   Title report < or = to 4/10 Lt hip/LE pain when coming from sit to stand and getting out of the car    Baseline 4/10 max    Status Achieved      PT SHORT TERM GOAL #3   Title report a 30% reduction in the frequency and intensity of Lt hip/leg  pain with change of position and walking    Status Achieved             PT Long Term Goals - 02/22/21 1157      PT LONG TERM GOAL #1   Title Independent with HEP    Status Achieved      PT LONG TERM GOAL #2   Title report < or = to 2/10 Lt hip pain with sit to stand and getting out of the car    Status Achieved      PT LONG TERM GOAL #3   Title perform 5x sit to stand in < or = to 13 seconds to reduce falls risk    Baseline 11.48    Status Achieved      PT LONG TERM GOAL #4   Title report a 90% reduction in the frequency and intensity of Lt hip pain with change of position and walking    Baseline Patient reports >= 90% reduction    Status Achieved      PT LONG TERM GOAL #5   Title demonstate 4+/5 Lt hip strength to improve stability and endurance with transitions and community activity    Status Achieved      Additional Long Term Goals   Additional Long Term Goals Yes      PT LONG TERM GOAL #6   Title Patient able to ambulate without forward trunk lean for >= 25 steps at her normal cadence.    Baseline 10 steps    Time 8    Period Weeks    Status New      PT LONG TERM GOAL #7   Title Patient to demo negative Thomas test bil to assist with normal gait pattern.    Time 8    Period Weeks    Status New                 Plan - 04/05/21 2145    Clinical Impression Statement The patient reports no sharp acute pain but she does have lower intensity central low back pain which affects her ability to stand up straight.  She was able to travel to the beach for vacation last  week without exacerbation of symptoms.  She states she was able to return to her water ex class which she likes b/c she can stand erect in the pool.  Good response to DN/ES to lumbar multifidi and added KT tape for activation and support to lumbo-pelvic musculature.  Therapist monitoring response throughout treatment session.    Comorbidities lumbar stenosis, DDD and COPD    Rehab Potential Good     PT Frequency 2x / week    PT Duration 4 weeks    PT Treatment/Interventions ADLs/Self Care Home Management;Cryotherapy;Electrical Stimulation;Moist Heat;Iontophoresis 4mg /ml Dexamethasone;Stair training;Gait training;Functional mobility training;Therapeutic activities;Therapeutic exercise;Balance training;Neuromuscular re-education;Manual techniques;Patient/family education;Passive range of motion;Dry needling;Taping    PT Next Visit Plan 20th visit progress note;  activity as tolerated, manual therapy to address tension and pain, e-stim as tolerated.  1-2 more visits probable    PT Home Exercise Plan Access Code: [email or web address]           Patient will benefit from skilled therapeutic intervention in order to improve the following deficits and impairments:  Abnormal gait,Decreased activity tolerance,Decreased balance,Postural dysfunction,Decreased strength,Impaired flexibility,Improper body mechanics,Pain,Decreased endurance,Increased muscle spasms,Difficulty walking,Decreased range of motion  Visit Diagnosis: Pain in left hip  Other abnormalities of gait and mobility  Muscle weakness (generalized)     Problem List Patient Active Problem List   Diagnosis Date Noted  . Anosmia 02/02/2021  . Degeneration of lumbar intervertebral disc 02/02/2021  . Hardening of the aorta (main artery of the heart) ([place]) 02/02/2021  . Hypercholesterolemia 02/02/2021  . Other seasonal allergic rhinitis 02/02/2021  . Other specified disorders of bone density and structure, other site 02/02/2021  . Personal history of colonic polyps 02/02/2021  . Chronic obstructive pulmonary disease ([place]) 02/02/2021  . Senile purpura ([place]) 02/02/2021  . Spinal stenosis 02/02/2021  . Abnormal findings on diagnostic imaging of other specified body structures 02/02/2021  . Coronary artery calcification 03/19/2018  . Ganglion of left wrist 03/07/2017  . Varicose veins of lower extremities with other complications 95/08/3266    Ruben Im, PT 04/05/21 9:51 PM Phone: [phone] Fax: [phone] [name] 04/05/2021, 9:50 PM  [facility] [address], [place], [place], [address] Phone: [phone]   Fax:  [phone]  Name: Karen Bonilla MRN: [id number] Date of Birth: January 12, 1941

## 2021-04-07 ENCOUNTER — Ambulatory Visit: Payer: Medicare PPO

## 2021-04-07 ENCOUNTER — Other Ambulatory Visit: Payer: Self-pay

## 2021-04-07 DIAGNOSIS — M6281 Muscle weakness (generalized): Secondary | ICD-10-CM

## 2021-04-07 DIAGNOSIS — R2689 Other abnormalities of gait and mobility: Secondary | ICD-10-CM | POA: Diagnosis not present

## 2021-04-07 DIAGNOSIS — M25552 Pain in left hip: Secondary | ICD-10-CM

## 2021-04-07 NOTE — Therapy (Signed)
[facility] [address] [place], [place], [address] Phone: [phone]   Fax:  [phone]  Physical Therapy Treatment  Patient Details  Name: Karen Bonilla MRN: [id number] Date of Birth: May 04, 1941 Referring Provider (PT): Leighton Ruff, MD   Encounter Date: 04/07/2021 Progress Note Reporting Period 02/11/21 to 04/07/21  See note below for Objective Data and Assessment of Progress/Goals.       PT End of Session - 04/07/21 1225    Visit Number 20    Date for PT Re-Evaluation 04/21/21    Authorization Type Cohere: 8 visits 02/24/21-04/18/21    Authorization - Visit Number 7    Authorization - Number of Visits 8    PT Start Time 3818   late   PT Stop Time 1226    PT Time Calculation (min) 33 min    Activity Tolerance Patient tolerated treatment well    Behavior During Therapy WFL for tasks assessed/performed           Past Medical History:  Diagnosis Date  . Anemia   . Basal cell carcinoma 09/01/2002   right shin tx exc  . BCC (basal cell carcinoma of skin) 06/20/2013   right forearm TX WITH BX  . BCC (basal cell carcinoma of skin) 06/20/2013   right upper lip TX WITH BX  . BCC (basal cell carcinoma of skin) 10/03/2016   mid upper back [place]  . Colon polyps   . COPD (chronic obstructive pulmonary disease) ([place])   . Hyperlipidemia   . Mucoid cyst of joint   . SCC (squamous cell carcinoma) 01/23/2011   left forearm scc/ka  TX CX3 5FU CAUTERY  . SCC (squamous cell carcinoma) 10/03/2016   right shin scc/ka TX CX3 5FU CAUTERY  . SCC (squamous cell carcinoma) 07/29/2019   left forehead scc TX CX3 5FU CAUTERY  . Spinal stenosis   . Squamous cell carcinoma of skin 10/23/2005   left lower shin inf. bowens  TX CX3 5FU CAUTERY  . Squamous cell carcinoma of skin 08/06/2020   in situ- right lower leg (CX35FU)  . Squamous cell carcinoma of skin 08/06/2020   in situ- right lower leg- posterior lower  (CX35FU)  . Varicose veins of lower extremities with other complications 2/99/3716    Past Surgical History:  Procedure Laterality Date  . ABDOMINAL HYSTERECTOMY    . MASS EXCISION  01/11/2012   Procedure: MINOR EXCISION OF MASS;  Surgeon: Cammie Sickle., MD;  Location: [facility];  Service: Orthopedics;  Laterality: Right;  excision mucoid cyst right index and dip joint debridement    There were no vitals filed for this visit.   Subjective Assessment - 04/07/21 1155    Subjective I feel 75% better.    Currently in Pain? No/denies              [facility] PT Assessment - 04/07/21 0001      Assessment   Medical Diagnosis Lt groin pain    Referring Provider (PT) Leighton Ruff, MD    Onset Date/Surgical Date 05/25/20    Hand Dominance Right      Prior Function   Level of Independence Independent                         [facility] Adult PT Treatment/Exercise - 04/07/21 0001      Knee/Hip Exercises: Stretches   Piriformis Stretch Both;30 seconds;3 reps  Other Knee/Hip Stretches butterfly stretch 5x 20 seconds      Knee/Hip Exercises: Standing   Forward Step Up Both;2 sets;10 reps;Step Height: 6"    SLS 3x10 seconds Rt and Lt with light hold      Knee/Hip Exercises: Seated   Sit to Sand 2 sets;10 reps      Knee/Hip Exercises: Supine   Other Supine Knee/Hip Exercises clam green loop 2x10                    PT Short Term Goals - 01/03/21 1622      PT SHORT TERM GOAL #1   Title independent with initial HEP    Status Achieved      PT SHORT TERM GOAL #2   Title report < or = to 4/10 Lt hip/LE pain when coming from sit to stand and getting out of the car    Baseline 4/10 max    Status Achieved      PT SHORT TERM GOAL #3   Title report a 30% reduction in the frequency and intensity of Lt hip/leg pain with change of position and walking    Status Achieved             PT Long Term Goals - 04/07/21 1156      PT LONG TERM  GOAL #1   Title Independent with HEP    Baseline PT will finalize prior to D/C    Status On-going      PT LONG TERM GOAL #2   Title report < or = to 2/10 Lt hip pain with sit to stand and getting out of the car    Baseline No pain with this now    Status Achieved      PT LONG TERM GOAL #4   Title report a 90% reduction in the frequency and intensity of Lt hip pain with change of position and walking    Baseline Patient reports >= 90% reduction      PT LONG TERM GOAL #5   Title demonstate 4+/5 Lt hip strength to improve stability and endurance with transitions and community activity                 Plan - 04/07/21 1157    Clinical Impression Statement Pt reports 85% overall reduction in LBP and 100% improvement in Lt groin pain.   Pt no longer experiences groin pain with transitional motions.  Pt reports some continued tension in the lumbar spine and plan is to perform 1 more dry needling next session prior to D/C.  PT spent session looking through HEP with pt on Medbridge and eliminated some due to volume of exercises.  PT emphasized the importance of compliance with balance exercises to improve stability and safety.  [facility]  Pt required some verbal cueing with exercise in the clinic today for alignment and muscle recruitment.  Pt will attend 1 more session for manual therapy to address lumbar tension and to finalize HEP.    Rehab Potential Good    PT Frequency 2x / week    PT Duration 4 weeks    PT Treatment/Interventions ADLs/Self Care Home Management;Cryotherapy;Electrical Stimulation;Moist Heat;Iontophoresis 4mg /ml Dexamethasone;Stair training;Gait training;Functional mobility training;Therapeutic activities;Therapeutic exercise;Balance training;Neuromuscular re-education;Manual techniques;Patient/family education;Passive range of motion;Dry needling;Taping    PT Next Visit Plan 1 more session- DN to lumbar spine.  Finalize HEP if  needed.    PT Home Exercise Plan Access  Code: [email or web address]    Consulted and Agree with Plan of Care Patient           Patient will benefit from skilled therapeutic intervention in order to improve the following deficits and impairments:  Abnormal gait,Decreased activity tolerance,Decreased balance,Postural dysfunction,Decreased strength,Impaired flexibility,Improper body mechanics,Pain,Decreased endurance,Increased muscle spasms,Difficulty walking,Decreased range of motion  Visit Diagnosis: Pain in left hip  Other abnormalities of gait and mobility  Muscle weakness (generalized)     Problem List Patient Active Problem List   Diagnosis Date Noted  . Anosmia 02/02/2021  . Degeneration of lumbar intervertebral disc 02/02/2021  . Hardening of the aorta (main artery of the heart) ([place]) 02/02/2021  . Hypercholesterolemia 02/02/2021  . Other seasonal allergic rhinitis 02/02/2021  . Other specified disorders of bone density and structure, other site 02/02/2021  . Personal history of colonic polyps 02/02/2021  . Chronic obstructive pulmonary disease ([place]) 02/02/2021  . Senile purpura ([place]) 02/02/2021  . Spinal stenosis 02/02/2021  . Abnormal findings on diagnostic imaging of other specified body structures 02/02/2021  . Coronary artery calcification 03/19/2018  . Ganglion of left wrist 03/07/2017  . Varicose veins of lower extremities with other complications 03/97/9536    Sigurd Sos, PT 04/07/21 12:27 PM  [facility] [address], [place], [place], [address] Phone: [phone]   Fax:  [phone]  Name: Karen Bonilla MRN: [id number] Date of Birth: 10/15/1941

## 2021-04-12 ENCOUNTER — Other Ambulatory Visit: Payer: Self-pay

## 2021-04-12 ENCOUNTER — Ambulatory Visit: Payer: Medicare PPO

## 2021-04-12 DIAGNOSIS — R2689 Other abnormalities of gait and mobility: Secondary | ICD-10-CM | POA: Diagnosis not present

## 2021-04-12 DIAGNOSIS — M25552 Pain in left hip: Secondary | ICD-10-CM

## 2021-04-12 DIAGNOSIS — M6281 Muscle weakness (generalized): Secondary | ICD-10-CM

## 2021-04-12 NOTE — Therapy (Addendum)
[facility] [address], [place], [place], [address] Phone: [phone]   Fax:  [phone]  Physical Therapy Treatment  Patient Details  Name: Karen Bonilla MRN: [id number] Date of Birth: 1941/11/01 Referring Provider (PT): Leighton Ruff, MD   Encounter Date: 04/12/2021   PT End of Session - 04/12/21 1226    Visit Number 21    Authorization - Visit Number 8    Authorization - Number of Visits 8    PT Start Time 1151   dry needling   PT Stop Time 1225    PT Time Calculation (min) 34 min    Activity Tolerance Patient tolerated treatment well    Behavior During Therapy [facility] for tasks assessed/performed           Past Medical History:  Diagnosis Date  . Anemia   . Basal cell carcinoma 09/01/2002   right shin tx exc  . BCC (basal cell carcinoma of skin) 06/20/2013   right forearm TX WITH BX  . BCC (basal cell carcinoma of skin) 06/20/2013   right upper lip TX WITH BX  . BCC (basal cell carcinoma of skin) 10/03/2016   mid upper back [place]  . Colon polyps   . COPD (chronic obstructive pulmonary disease) ([place])   . Hyperlipidemia   . Mucoid cyst of joint   . SCC (squamous cell carcinoma) 01/23/2011   left forearm scc/ka  TX CX3 5FU CAUTERY  . SCC (squamous cell carcinoma) 10/03/2016   right shin scc/ka TX CX3 5FU CAUTERY  . SCC (squamous cell carcinoma) 07/29/2019   left forehead scc TX CX3 5FU CAUTERY  . Spinal stenosis   . Squamous cell carcinoma of skin 10/23/2005   left lower shin inf. bowens  TX CX3 5FU CAUTERY  . Squamous cell carcinoma of skin 08/06/2020   in situ- right lower leg (CX35FU)  . Squamous cell carcinoma of skin 08/06/2020   in situ- right lower leg- posterior lower (CX35FU)  . Varicose veins of lower extremities with other complications 0/09/2724    Past Surgical History:  Procedure Laterality Date  . ABDOMINAL HYSTERECTOMY    . MASS EXCISION  01/11/2012   Procedure: MINOR  EXCISION OF MASS;  Surgeon: Cammie Sickle., MD;  Location: [facility];  Service: Orthopedics;  Laterality: Right;  excision mucoid cyst right index and dip joint debridement    There were no vitals filed for this visit.   Subjective Assessment - 04/12/21 1153    Subjective Ready to D/C to HEP.    Currently in Pain? Yes    Pain Score 4     Pain Location Back    Pain Orientation Left    Pain Descriptors / Indicators Aching;Sore    Pain Type Chronic pain    Pain Onset More than a month ago    Pain Frequency Intermittent    Aggravating Factors  getting up/down from a chair, bending over, just my usual ache    Pain Relieving Factors Needling, electrical stimulation, Lidocane patch                             [facility] Adult PT Treatment/Exercise - 04/12/21 0001      Lumbar Exercises: Seated   Other Seated Lumbar Exercises sidebending x20 seconds -added to HEP      Manual Therapy   Manual Therapy Myofascial release    Manual therapy comments elongation  to Lt adductors            Trigger Point Dry Needling - 04/12/21 0001    Consent Given? Yes    Muscles Treated Lower Quadrant Adductor longus/brevis/magnus   Lt only   Adductor Response Twitch response elicited;Palpable increased muscle length                PT Education - 04/12/21 1200    Education Details Access Code: [email or web address]    Person(s) Educated Patient    Methods Explanation;Demonstration    Comprehension Verbalized understanding;Returned demonstration            PT Short Term Goals - 01/03/21 1622      PT SHORT TERM GOAL #1   Title independent with initial HEP    Status Achieved      PT SHORT TERM GOAL #2   Title report < or = to 4/10 Lt hip/LE pain when coming from sit to stand and getting out of the car    Baseline 4/10 max    Status Achieved      PT SHORT TERM GOAL #3   Title report a 30% reduction in the frequency and intensity of Lt hip/leg pain with change of  position and walking    Status Achieved             PT Long Term Goals - 04/12/21 1230      PT LONG TERM GOAL #1   Title Independent with HEP    Status Achieved      PT LONG TERM GOAL #2   Title report < or = to 2/10 Lt hip pain with sit to stand and getting out of the car    Baseline No pain with this now    Status Achieved      PT LONG TERM GOAL #3   Title perform 5x sit to stand in < or = to 13 seconds to reduce falls risk    Baseline 11.48    Status Achieved      PT LONG TERM GOAL #4   Title report a 90% reduction in the frequency and intensity of Lt hip pain with change of position and walking    Baseline Patient reports >= 90% reduction    Status Achieved      PT LONG TERM GOAL #5   Title demonstate 4+/5 Lt hip strength to improve stability and endurance with transitions and community activity    Status Partially Met      PT LONG TERM GOAL #6   Title Patient able to ambulate without forward trunk lean for >= 25 steps at her normal cadence.    Baseline 10 steps    Status Partially Met      PT LONG TERM GOAL #7   Title Patient to demo negative Thomas test bil to assist with normal gait pattern.    Status Partially Met                 Plan - 04/12/21 1227    Clinical Impression Statement Pt is ready for D/C to HEP.  Last session was spent modifying and finalizing her HEP.  Pt is independent and consistent with this program.  Pt reports 85% overall reduction in LBP and 100% improvement in Lt groin pain.   Pt no longer experiences groin pain with transitional motions.  Pt reports some continued tension in the Lt adductors.  [facility]  Pt with  tension and trigger points in the L adductors and good response to dry needling with multiple twitch responses.  Pt with reduced tension after manual therapy today.  Pt will D/C to HEP and follow-up with MD as needed.    PT Next Visit Plan D/C PT to HEP today    Consulted and Agree  with Plan of Care Patient           Patient will benefit from skilled therapeutic intervention in order to improve the following deficits and impairments:     Visit Diagnosis: Pain in left hip  Other abnormalities of gait and mobility  Muscle weakness (generalized)     Problem List Patient Active Problem List   Diagnosis Date Noted  . Anosmia 02/02/2021  . Degeneration of lumbar intervertebral disc 02/02/2021  . Hardening of the aorta (main artery of the heart) ([place]) 02/02/2021  . Hypercholesterolemia 02/02/2021  . Other seasonal allergic rhinitis 02/02/2021  . Other specified disorders of bone density and structure, other site 02/02/2021  . Personal history of colonic polyps 02/02/2021  . Chronic obstructive pulmonary disease ([place]) 02/02/2021  . Senile purpura ([place]) 02/02/2021  . Spinal stenosis 02/02/2021  . Abnormal findings on diagnostic imaging of other specified body structures 02/02/2021  . Coronary artery calcification 03/19/2018  . Ganglion of left wrist 03/07/2017  . Varicose veins of lower extremities with other complications 03/70/4888   PHYSICAL THERAPY DISCHARGE SUMMARY  Visits from Start of Care: 21  Current functional level related to goals / functional outcomes: See above for current status.     Remaining deficits: Postural assymmetries and muscle length differences due to scoliosis.  Pt has HEP for lumbopelvic flexibility and strength and will continue with water aerobics.     Education / Equipment: HEP, posture/body mechanics Plan: Patient agrees to discharge.  Patient goals were partially met. Patient is being discharged due to being pleased with the current functional level.  ?????         Sigurd Sos, PT 04/12/21 12:31 PM  [facility] [address] [place], [place], [address] Phone: [phone]   Fax:  [phone]  Name: Karen Bonilla MRN: [id number] Date of Birth:  July 28, 1941

## 2021-04-12 NOTE — Patient Instructions (Signed)
Access Code: [email or web address] URL: [email or web address] Date: 04/12/2021 Prepared by: Shelva Majestic Stretch on Table - 2 x daily - 7 x weekly - 3 reps - 1 sets - 30-60 sec hold

## 2021-04-13 ENCOUNTER — Encounter: Payer: Self-pay | Admitting: Physician Assistant

## 2021-04-13 ENCOUNTER — Ambulatory Visit: Payer: Medicare PPO | Admitting: Physician Assistant

## 2021-04-13 DIAGNOSIS — D229 Melanocytic nevi, unspecified: Secondary | ICD-10-CM | POA: Diagnosis not present

## 2021-04-13 DIAGNOSIS — Z1283 Encounter for screening for malignant neoplasm of skin: Secondary | ICD-10-CM | POA: Diagnosis not present

## 2021-04-13 DIAGNOSIS — L820 Inflamed seborrheic keratosis: Secondary | ICD-10-CM | POA: Diagnosis not present

## 2021-04-13 DIAGNOSIS — D1800 Hemangioma unspecified site: Secondary | ICD-10-CM | POA: Diagnosis not present

## 2021-04-13 DIAGNOSIS — L578 Other skin changes due to chronic exposure to nonionizing radiation: Secondary | ICD-10-CM

## 2021-04-13 DIAGNOSIS — Z86007 Personal history of in-situ neoplasm of skin: Secondary | ICD-10-CM

## 2021-04-13 DIAGNOSIS — L814 Other melanin hyperpigmentation: Secondary | ICD-10-CM

## 2021-04-13 DIAGNOSIS — L821 Other seborrheic keratosis: Secondary | ICD-10-CM

## 2021-05-05 ENCOUNTER — Encounter: Payer: Self-pay | Admitting: Physician Assistant

## 2021-05-05 NOTE — Progress Notes (Signed)
   Follow-Up Visit   Subjective  VARSHA KNOCK is a 80 y.o. female who presents for the following: Follow-up (3 month f/u right lower leg anterior upper & right lower leg posterior lower- healing good no concerns. Per records reviewed multiple non mole skin cancers. ).   The following portions of the chart were reviewed this encounter and updated as appropriate:      Objective  Well appearing patient in no apparent distress; mood and affect are within normal limits.  A full examination was performed including scalp, head, eyes, ears, nose, lips, neck, chest, axillae, abdomen, back, buttocks, bilateral upper extremities, bilateral lower extremities, hands, feet, fingers, toes, fingernails, and toenails. All findings within normal limits unless otherwise noted below.  Objective  Chest - Medial (Center) (5), Head - Anterior (Face) (4), Mid Back (10): Erythematous stuck-on, waxy papule.  Objective  Right Lower Leg - Anterior: History of CIS right lower leg anterior and posterior. Scars are clear.   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 Patient will continue with yearly skin exams.   Lentigines - Scattered tan macules - Discussed due to sun exposure - Benign, observe - Call for any changes  Seborrheic Keratoses - Stuck-on, waxy, tan-brown papules and plaques  - Discussed benign etiology and prognosis. - Observe - Call for any  changes  Melanocytic Nevi - Tan-brown and/or pink-flesh-colored symmetric macules and papules - Benign appearing on exam today - Observation - Call clinic for new or changing moles - Recommend daily use of broad spectrum spf 30+ sunscreen to sun-exposed areas.   Hemangiomas - Red papules - Discussed benign nature - Observe - Call for any changes  Actinic Damage - diffuse scaly erythematous macules with underlying dyspigmentation - Recommend daily broad spectrum sunscreen SPF 30+ to sun-exposed areas, reapply every 2 hours as needed.  - Call for new or changing lesions.  Skin cancer screening performed today.   I, Tamella Tuccillo, PA-C, have reviewed all documentation's for this visit.  The documentation on 05/05/21 for the exam, diagnosis, procedures and orders are all accurate and complete.

## 2021-06-06 DIAGNOSIS — H5203 Hypermetropia, bilateral: Secondary | ICD-10-CM | POA: Diagnosis not present

## 2021-06-06 DIAGNOSIS — H524 Presbyopia: Secondary | ICD-10-CM | POA: Diagnosis not present

## 2021-06-06 DIAGNOSIS — H2513 Age-related nuclear cataract, bilateral: Secondary | ICD-10-CM | POA: Diagnosis not present

## 2021-06-06 DIAGNOSIS — H25013 Cortical age-related cataract, bilateral: Secondary | ICD-10-CM | POA: Diagnosis not present

## 2021-06-20 DIAGNOSIS — Z1389 Encounter for screening for other disorder: Secondary | ICD-10-CM | POA: Diagnosis not present

## 2021-06-20 DIAGNOSIS — Z0000 Encounter for general adult medical examination without abnormal findings: Secondary | ICD-10-CM | POA: Diagnosis not present

## 2021-06-21 DIAGNOSIS — R1909 Other intra-abdominal and pelvic swelling, mass and lump: Secondary | ICD-10-CM | POA: Diagnosis not present

## 2021-06-21 DIAGNOSIS — M4800 Spinal stenosis, site unspecified: Secondary | ICD-10-CM | POA: Diagnosis not present

## 2021-06-21 DIAGNOSIS — J449 Chronic obstructive pulmonary disease, unspecified: Secondary | ICD-10-CM | POA: Diagnosis not present

## 2021-06-21 DIAGNOSIS — I700 Atherosclerosis of aorta: Secondary | ICD-10-CM | POA: Diagnosis not present

## 2021-06-21 DIAGNOSIS — I2510 Atherosclerotic heart disease of native coronary artery without angina pectoris: Secondary | ICD-10-CM | POA: Diagnosis not present

## 2021-06-21 DIAGNOSIS — E7800 Pure hypercholesterolemia, unspecified: Secondary | ICD-10-CM | POA: Diagnosis not present

## 2021-06-21 DIAGNOSIS — R030 Elevated blood-pressure reading, without diagnosis of hypertension: Secondary | ICD-10-CM | POA: Diagnosis not present

## 2021-06-21 DIAGNOSIS — J439 Emphysema, unspecified: Secondary | ICD-10-CM | POA: Diagnosis not present

## 2021-06-21 DIAGNOSIS — M5136 Other intervertebral disc degeneration, lumbar region: Secondary | ICD-10-CM | POA: Diagnosis not present

## 2021-06-22 DIAGNOSIS — M4800 Spinal stenosis, site unspecified: Secondary | ICD-10-CM | POA: Diagnosis not present

## 2021-06-22 DIAGNOSIS — E7800 Pure hypercholesterolemia, unspecified: Secondary | ICD-10-CM | POA: Diagnosis not present

## 2021-06-22 DIAGNOSIS — M5136 Other intervertebral disc degeneration, lumbar region: Secondary | ICD-10-CM | POA: Diagnosis not present

## 2021-06-22 DIAGNOSIS — J439 Emphysema, unspecified: Secondary | ICD-10-CM | POA: Diagnosis not present

## 2021-06-22 DIAGNOSIS — R1909 Other intra-abdominal and pelvic swelling, mass and lump: Secondary | ICD-10-CM | POA: Diagnosis not present

## 2021-06-22 DIAGNOSIS — J449 Chronic obstructive pulmonary disease, unspecified: Secondary | ICD-10-CM | POA: Diagnosis not present

## 2021-06-22 DIAGNOSIS — I700 Atherosclerosis of aorta: Secondary | ICD-10-CM | POA: Diagnosis not present

## 2021-06-22 DIAGNOSIS — I2510 Atherosclerotic heart disease of native coronary artery without angina pectoris: Secondary | ICD-10-CM | POA: Diagnosis not present

## 2021-06-22 DIAGNOSIS — R030 Elevated blood-pressure reading, without diagnosis of hypertension: Secondary | ICD-10-CM | POA: Diagnosis not present

## 2021-06-22 DIAGNOSIS — M8588 Other specified disorders of bone density and structure, other site: Secondary | ICD-10-CM | POA: Diagnosis not present

## 2021-06-24 ENCOUNTER — Other Ambulatory Visit: Payer: Self-pay | Admitting: Family Medicine

## 2021-06-24 DIAGNOSIS — R1909 Other intra-abdominal and pelvic swelling, mass and lump: Secondary | ICD-10-CM

## 2021-07-08 ENCOUNTER — Ambulatory Visit
Admission: RE | Admit: 2021-07-08 | Discharge: 2021-07-08 | Disposition: A | Discharge status: Home / Self Care | Payer: Medicare PPO | Source: Ambulatory Visit | Attending: Family Medicine | Admitting: Family Medicine

## 2021-07-08 DIAGNOSIS — R1909 Other intra-abdominal and pelvic swelling, mass and lump: Secondary | ICD-10-CM

## 2021-08-01 DIAGNOSIS — J439 Emphysema, unspecified: Secondary | ICD-10-CM | POA: Diagnosis not present

## 2021-08-01 DIAGNOSIS — M4800 Spinal stenosis, site unspecified: Secondary | ICD-10-CM | POA: Diagnosis not present

## 2021-08-01 DIAGNOSIS — R030 Elevated blood-pressure reading, without diagnosis of hypertension: Secondary | ICD-10-CM | POA: Diagnosis not present

## 2021-08-01 DIAGNOSIS — M5136 Other intervertebral disc degeneration, lumbar region: Secondary | ICD-10-CM | POA: Diagnosis not present

## 2021-08-01 DIAGNOSIS — J449 Chronic obstructive pulmonary disease, unspecified: Secondary | ICD-10-CM | POA: Diagnosis not present

## 2021-08-01 DIAGNOSIS — D692 Other nonthrombocytopenic purpura: Secondary | ICD-10-CM | POA: Diagnosis not present

## 2021-08-01 DIAGNOSIS — I2510 Atherosclerotic heart disease of native coronary artery without angina pectoris: Secondary | ICD-10-CM | POA: Diagnosis not present

## 2021-08-01 DIAGNOSIS — I700 Atherosclerosis of aorta: Secondary | ICD-10-CM | POA: Diagnosis not present

## 2021-08-01 DIAGNOSIS — E7800 Pure hypercholesterolemia, unspecified: Secondary | ICD-10-CM | POA: Diagnosis not present

## 2021-08-15 DIAGNOSIS — M85851 Other specified disorders of bone density and structure, right thigh: Secondary | ICD-10-CM | POA: Diagnosis not present

## 2021-08-15 DIAGNOSIS — Z1231 Encounter for screening mammogram for malignant neoplasm of breast: Secondary | ICD-10-CM | POA: Diagnosis not present

## 2021-08-15 DIAGNOSIS — M85852 Other specified disorders of bone density and structure, left thigh: Secondary | ICD-10-CM | POA: Diagnosis not present

## 2021-08-15 DIAGNOSIS — Z780 Asymptomatic menopausal state: Secondary | ICD-10-CM | POA: Diagnosis not present

## 2021-08-16 ENCOUNTER — Ambulatory Visit: Payer: Medicare PPO | Admitting: Physician Assistant

## 2021-08-16 ENCOUNTER — Other Ambulatory Visit: Payer: Self-pay

## 2021-08-16 ENCOUNTER — Encounter: Payer: Self-pay | Admitting: Physician Assistant

## 2021-08-16 DIAGNOSIS — L820 Inflamed seborrheic keratosis: Secondary | ICD-10-CM | POA: Diagnosis not present

## 2021-08-16 DIAGNOSIS — Z1283 Encounter for screening for malignant neoplasm of skin: Secondary | ICD-10-CM

## 2021-08-16 DIAGNOSIS — Z85828 Personal history of other malignant neoplasm of skin: Secondary | ICD-10-CM | POA: Diagnosis not present

## 2021-09-08 ENCOUNTER — Encounter: Payer: Self-pay | Admitting: Physician Assistant

## 2021-09-08 NOTE — Progress Notes (Signed)
   Follow-Up Visit   Subjective  Karen Bonilla is a 80 y.o. female who presents for the following: Annual Exam (Wants sk removed on forehead, chest & each arm. Personal history of non mole skin cancers but no melanoma. No family history of melanoma or non mole skin cancers. ).   The following portions of the chart were reviewed this encounter and updated as appropriate:  Tobacco  Allergies  Meds  Problems  Med Hx  Surg Hx  Fam Hx      Objective  Well appearing patient in no apparent distress; mood and affect are within normal limits.  A full examination was performed including scalp, head, eyes, ears, nose, lips, neck, chest, axillae, abdomen, back, buttocks, bilateral upper extremities, bilateral lower extremities, hands, feet, fingers, toes, fingernails, and toenails. All findings within normal limits unless otherwise noted below.  Chest - Medial [facility]), Head - Anterior (Face), Left Breast, Left Forearm - Anterior, Right Breast, Right Forearm - Anterior Erythematous stuck-on, waxy plaque.   Assessment & Plan  Inflamed seborrheic keratosis Head - Anterior (Face); Left Forearm - Anterior; Right Forearm - Anterior; Chest - Medial [facility]); Left Breast; Right Breast  Destruction of lesion - Chest - Medial (Center), Head - Anterior (Face), Left Breast, Left Forearm - Anterior, Right Breast, Right Forearm - Anterior Complexity: simple   Destruction method: cryotherapy   Informed consent: discussed and consent obtained   Timeout:  patient name, date of birth, surgical site, and procedure verified Lesion destroyed using liquid nitrogen: Yes   Cryotherapy cycles:  1 Outcome: patient tolerated procedure well with no complications   Post-procedure details: wound care instructions given     I, Layden Caterino, PA-C, have reviewed all documentation's for this visit.  The documentation on 09/08/21 for the exam, diagnosis, procedures and orders are all accurate and complete.

## 2021-09-13 DIAGNOSIS — Z720 Tobacco use: Secondary | ICD-10-CM | POA: Diagnosis not present

## 2021-09-13 DIAGNOSIS — K4090 Unilateral inguinal hernia, without obstruction or gangrene, not specified as recurrent: Secondary | ICD-10-CM | POA: Diagnosis not present

## 2021-09-21 DIAGNOSIS — M5440 Lumbago with sciatica, unspecified side: Secondary | ICD-10-CM | POA: Diagnosis not present

## 2021-09-21 DIAGNOSIS — Z6826 Body mass index (BMI) 26.0-26.9, adult: Secondary | ICD-10-CM | POA: Diagnosis not present

## 2021-09-28 DIAGNOSIS — M5442 Lumbago with sciatica, left side: Secondary | ICD-10-CM | POA: Diagnosis not present

## 2021-09-28 DIAGNOSIS — G8929 Other chronic pain: Secondary | ICD-10-CM | POA: Diagnosis not present

## 2021-09-28 DIAGNOSIS — M4120 Other idiopathic scoliosis, site unspecified: Secondary | ICD-10-CM | POA: Diagnosis not present

## 2021-09-28 DIAGNOSIS — M5416 Radiculopathy, lumbar region: Secondary | ICD-10-CM | POA: Diagnosis not present

## 2021-10-05 DIAGNOSIS — M545 Low back pain, unspecified: Secondary | ICD-10-CM | POA: Diagnosis not present

## 2021-10-05 DIAGNOSIS — M5442 Lumbago with sciatica, left side: Secondary | ICD-10-CM | POA: Diagnosis not present

## 2021-10-20 DIAGNOSIS — M5416 Radiculopathy, lumbar region: Secondary | ICD-10-CM | POA: Diagnosis not present

## 2021-10-20 DIAGNOSIS — R030 Elevated blood-pressure reading, without diagnosis of hypertension: Secondary | ICD-10-CM | POA: Diagnosis not present

## 2021-11-02 DIAGNOSIS — K649 Unspecified hemorrhoids: Secondary | ICD-10-CM | POA: Diagnosis not present

## 2021-11-02 DIAGNOSIS — Z1211 Encounter for screening for malignant neoplasm of colon: Secondary | ICD-10-CM | POA: Diagnosis not present

## 2021-11-02 DIAGNOSIS — K635 Polyp of colon: Secondary | ICD-10-CM | POA: Diagnosis not present

## 2021-11-02 DIAGNOSIS — K5730 Diverticulosis of large intestine without perforation or abscess without bleeding: Secondary | ICD-10-CM | POA: Diagnosis not present

## 2021-11-02 DIAGNOSIS — K621 Rectal polyp: Secondary | ICD-10-CM | POA: Diagnosis not present

## 2021-11-14 DIAGNOSIS — M5416 Radiculopathy, lumbar region: Secondary | ICD-10-CM | POA: Diagnosis not present

## 2021-12-12 DIAGNOSIS — L97912 Non-pressure chronic ulcer of unspecified part of right lower leg with fat layer exposed: Secondary | ICD-10-CM | POA: Diagnosis not present

## 2021-12-12 DIAGNOSIS — L03115 Cellulitis of right lower limb: Secondary | ICD-10-CM | POA: Diagnosis not present

## 2021-12-14 ENCOUNTER — Other Ambulatory Visit: Payer: Self-pay | Admitting: *Deleted

## 2021-12-14 DIAGNOSIS — Z87891 Personal history of nicotine dependence: Secondary | ICD-10-CM

## 2021-12-14 DIAGNOSIS — F17210 Nicotine dependence, cigarettes, uncomplicated: Secondary | ICD-10-CM

## 2021-12-15 DIAGNOSIS — L03115 Cellulitis of right lower limb: Secondary | ICD-10-CM | POA: Diagnosis not present

## 2021-12-22 DIAGNOSIS — L089 Local infection of the skin and subcutaneous tissue, unspecified: Secondary | ICD-10-CM | POA: Diagnosis not present

## 2021-12-28 ENCOUNTER — Ambulatory Visit: Payer: Self-pay | Admitting: General Surgery

## 2022-01-02 ENCOUNTER — Ambulatory Visit (INDEPENDENT_AMBULATORY_CARE_PROVIDER_SITE_OTHER)
Admission: RE | Admit: 2022-01-02 | Discharge: 2022-01-02 | Disposition: A | Discharge status: Home / Self Care | Payer: Medicare PPO | Source: Ambulatory Visit | Attending: Family Medicine | Admitting: Family Medicine

## 2022-01-02 ENCOUNTER — Other Ambulatory Visit: Payer: Self-pay

## 2022-01-02 DIAGNOSIS — Z87891 Personal history of nicotine dependence: Secondary | ICD-10-CM

## 2022-01-02 DIAGNOSIS — F17210 Nicotine dependence, cigarettes, uncomplicated: Secondary | ICD-10-CM | POA: Diagnosis not present

## 2022-01-04 ENCOUNTER — Other Ambulatory Visit: Payer: Self-pay | Admitting: Acute Care

## 2022-01-04 DIAGNOSIS — F17210 Nicotine dependence, cigarettes, uncomplicated: Secondary | ICD-10-CM

## 2022-01-04 DIAGNOSIS — Z87891 Personal history of nicotine dependence: Secondary | ICD-10-CM

## 2022-01-05 NOTE — Progress Notes (Signed)
Covid test on          Come thru Main entrance at [facility].  Have a seat in the lobby in right as you come thru the door.  Call [phone] and give them your name and let them know you are here for covid testing.     Your procedure is scheduled on:    01/20/2022   Report to [facility] Main  Entrance   Report to admitting at    [phone]     Call this number if you have problems the morning of surgery [phone]    REMEMBER: NO  SOLID FOOD CANDY OR GUM AFTER MIDNIGHT. CLEAR LIQUIDS UNTIL   0630am      . NOTHING BY MOUTH EXCEPT CLEAR LIQUIDS UNTIL 0630am   . PLEASE FINISH ENSURE DRINK PER SURGEON ORDER  WHICH NEEDS TO BE COMPLETED AT   0630am   .      CLEAR LIQUID DIET   Foods Allowed                                                                    Coffee and tea, regular and decaf                            Fruit ices (not with fruit pulp)                                      Iced Popsicles                                    Carbonated beverages, regular and diet                                    Cranberry, grape and apple juices Sports drinks like Gatorade Lightly seasoned clear broth or consume(fat free) Sugar, honey syrup ___________________________________________________________________      BRUSH YOUR TEETH MORNING OF SURGERY AND RINSE YOUR MOUTH OUT, NO CHEWING GUM CANDY OR MINTS.     Take these medicines the morning of surgery with A SIP OF WATER:  inhalers as usual and bring   DO NOT TAKE ANY DIABETIC MEDICATIONS DAY OF YOUR SURGERY                               You may not have any metal on your body including hair pins and              piercings  Do not wear jewelry, make-up, lotions, powders or perfumes, deodorant             Do not wear nail polish on your fingernails.  Do not shave  48 hours prior to surgery.              Men may shave face and neck.   Do not bring valuables to the hospital. [facility] IS NOT  RESPONSIBLE   FOR  VALUABLES.  Contacts, dentures or bridgework may not be worn into surgery.  Leave suitcase in the car. After surgery it may be brought to your room.     Patients discharged the day of surgery will not be allowed to drive home. IF YOU ARE HAVING SURGERY AND GOING HOME THE SAME DAY, YOU MUST HAVE AN ADULT TO DRIVE YOU HOME AND BE WITH YOU FOR 24 HOURS. YOU MAY GO HOME BY TAXI OR UBER OR ORTHERWISE, BUT AN ADULT MUST ACCOMPANY YOU HOME AND STAY WITH YOU FOR 24 HOURS.  Name and phone number of your driver:  Special Instructions: N/A              Please read over the following fact sheets you were given: _____________________________________________________________________  [facility] - Preparing for Surgery Before surgery, you can play an important role.  Because skin is not sterile, your skin needs to be as free of germs as possible.  You can reduce the number of germs on your skin by washing with CHG (chlorahexidine gluconate) soap before surgery.  CHG is an antiseptic cleaner which kills germs and bonds with the skin to continue killing germs even after washing. Please DO NOT use if you have an allergy to CHG or antibacterial soaps.  If your skin becomes reddened/irritated stop using the CHG and inform your nurse when you arrive at Short Stay. Do not shave (including legs and underarms) for at least 48 hours prior to the first CHG shower.  You may shave your face/neck. Please follow these instructions carefully:  1.  Shower with CHG Soap the night before surgery and the  morning of Surgery.  2.  If you choose to wash your hair, wash your hair first as usual with your  normal  shampoo.  3.  After you shampoo, rinse your hair and body thoroughly to remove the  shampoo.                           4.  Use CHG as you would any other liquid soap.  You can apply chg directly  to the skin and wash                       Gently with a scrungie or clean washcloth.  5.  Apply the CHG Soap to your body ONLY  FROM THE NECK DOWN.   Do not use on face/ open                           Wound or open sores. Avoid contact with eyes, ears mouth and genitals (private parts).                       Wash face,  Genitals (private parts) with your normal soap.             6.  Wash thoroughly, paying special attention to the area where your surgery  will be performed.  7.  Thoroughly rinse your body with warm water from the neck down.  8.  DO NOT shower/wash with your normal soap after using and rinsing off  the CHG Soap.                9.  Pat yourself dry with a clean towel.            10.  Wear clean pajamas.            11.  Place clean sheets on your bed the night of your first shower and do not  sleep with pets. Day of Surgery : Do not apply any lotions/deodorants the morning of surgery.  Please wear clean clothes to the hospital/surgery center.  FAILURE TO FOLLOW THESE INSTRUCTIONS MAY RESULT IN THE CANCELLATION OF YOUR SURGERY PATIENT SIGNATURE_________________________________  NURSE SIGNATURE__________________________________  ________________________________________________________________________

## 2022-01-05 NOTE — Progress Notes (Addendum)
Anesthesia Review:  PCP: Dr Ulanda Edison  Cardiologist : none  Chest x-ray : 01/03/2022 CT Chest /lung  EKG : Echo : Stress test: Cardiac Cath :  Activity level: can do a flight of stairs without difficulty  Sleep Study/ CPAP : none  Fasting Blood Sugar :      / Checks Blood Sugar -- times a day:   Blood Thinner/ Instructions /Last Dose: ASA / Instructions/ Last Dose :   Covid test  01/17/22 AT 0945 AM  sMOKER

## 2022-01-09 ENCOUNTER — Encounter (HOSPITAL_COMMUNITY)
Admission: RE | Admit: 2022-01-09 | Discharge: 2022-01-09 | Disposition: A | Discharge status: Home / Self Care | Payer: Medicare PPO | Source: Ambulatory Visit | Attending: General Surgery | Admitting: General Surgery

## 2022-01-09 ENCOUNTER — Other Ambulatory Visit: Payer: Self-pay

## 2022-01-09 ENCOUNTER — Encounter (HOSPITAL_COMMUNITY): Payer: Self-pay

## 2022-01-09 VITALS — BP 143/74 | HR 69 | Temp 98.4°F | Resp 16 | Ht 66.5 in | Wt 148.0 lb

## 2022-01-09 DIAGNOSIS — Z01812 Encounter for preprocedural laboratory examination: Secondary | ICD-10-CM | POA: Insufficient documentation

## 2022-01-09 DIAGNOSIS — Z01818 Encounter for other preprocedural examination: Secondary | ICD-10-CM

## 2022-01-09 HISTORY — DX: Unspecified osteoarthritis, unspecified site: M19.90

## 2022-01-09 LAB — CBC
HCT: 43.9 % (ref 36.0–46.0)
Hemoglobin: 14.4 g/dL (ref 12.0–15.0)
MCH: 31 pg (ref 26.0–34.0)
MCHC: 32.8 g/dL (ref 30.0–36.0)
MCV: 94.6 fL (ref 80.0–100.0)
Platelets: 288 10*3/uL (ref 150–400)
RBC: 4.64 MIL/uL (ref 3.87–5.11)
RDW: 12.5 % (ref 11.5–15.5)
WBC: 6.3 10*3/uL (ref 4.0–10.5)
nRBC: 0 % (ref 0.0–0.2)

## 2022-01-16 DIAGNOSIS — M47816 Spondylosis without myelopathy or radiculopathy, lumbar region: Secondary | ICD-10-CM | POA: Diagnosis not present

## 2022-01-17 ENCOUNTER — Other Ambulatory Visit: Payer: Self-pay

## 2022-01-17 ENCOUNTER — Encounter (HOSPITAL_COMMUNITY)
Admission: RE | Admit: 2022-01-17 | Discharge: 2022-01-17 | Disposition: A | Discharge status: Home / Self Care | Payer: Medicare PPO | Source: Ambulatory Visit | Attending: General Surgery | Admitting: General Surgery

## 2022-01-17 DIAGNOSIS — Z01818 Encounter for other preprocedural examination: Secondary | ICD-10-CM

## 2022-01-17 DIAGNOSIS — Z01812 Encounter for preprocedural laboratory examination: Secondary | ICD-10-CM | POA: Insufficient documentation

## 2022-01-17 DIAGNOSIS — Z20822 Contact with and (suspected) exposure to covid-19: Secondary | ICD-10-CM | POA: Diagnosis not present

## 2022-01-17 LAB — SARS CORONAVIRUS 2 (TAT 6-24 HRS): SARS Coronavirus 2: NEGATIVE

## 2022-01-20 ENCOUNTER — Ambulatory Visit (HOSPITAL_COMMUNITY): Payer: Medicare PPO | Admitting: Physician Assistant

## 2022-01-20 ENCOUNTER — Other Ambulatory Visit: Payer: Self-pay

## 2022-01-20 ENCOUNTER — Observation Stay (HOSPITAL_COMMUNITY)
Admission: RE | Admit: 2022-01-20 | Discharge: 2022-01-21 | Disposition: A | Discharge status: Home / Self Care | Payer: Medicare PPO | Attending: General Surgery | Admitting: General Surgery

## 2022-01-20 ENCOUNTER — Encounter (HOSPITAL_COMMUNITY)
Admission: RE | Disposition: A | Discharge status: Home / Self Care | Payer: Self-pay | Source: Home / Self Care | Attending: General Surgery

## 2022-01-20 ENCOUNTER — Ambulatory Visit (HOSPITAL_COMMUNITY): Payer: Medicare PPO | Admitting: Certified Registered Nurse Anesthetist

## 2022-01-20 ENCOUNTER — Encounter (HOSPITAL_COMMUNITY): Payer: Self-pay | Admitting: General Surgery

## 2022-01-20 DIAGNOSIS — Z9089 Acquired absence of other organs: Secondary | ICD-10-CM | POA: Insufficient documentation

## 2022-01-20 DIAGNOSIS — J449 Chronic obstructive pulmonary disease, unspecified: Secondary | ICD-10-CM | POA: Insufficient documentation

## 2022-01-20 DIAGNOSIS — F17210 Nicotine dependence, cigarettes, uncomplicated: Secondary | ICD-10-CM | POA: Insufficient documentation

## 2022-01-20 DIAGNOSIS — I2510 Atherosclerotic heart disease of native coronary artery without angina pectoris: Secondary | ICD-10-CM | POA: Diagnosis not present

## 2022-01-20 DIAGNOSIS — M1990 Unspecified osteoarthritis, unspecified site: Secondary | ICD-10-CM | POA: Diagnosis not present

## 2022-01-20 DIAGNOSIS — Z79899 Other long term (current) drug therapy: Secondary | ICD-10-CM | POA: Diagnosis not present

## 2022-01-20 DIAGNOSIS — K4020 Bilateral inguinal hernia, without obstruction or gangrene, not specified as recurrent: Secondary | ICD-10-CM | POA: Diagnosis not present

## 2022-01-20 HISTORY — PX: INGUINAL HERNIA REPAIR: SHX194

## 2022-01-20 SURGERY — REPAIR, HERNIA, INGUINAL, LAPAROSCOPIC
Anesthesia: General | Laterality: Bilateral

## 2022-01-20 MED ORDER — AMISULPRIDE (ANTIEMETIC) 5 MG/2ML IV SOLN: 10.0000 mg | Freq: Once | INTRAVENOUS | Status: DC | PRN

## 2022-01-20 MED ORDER — DIPHENHYDRAMINE HCL 50 MG/ML IJ SOLN: 12.5000 mg | Freq: Four times a day (QID) | INTRAMUSCULAR | Status: DC | PRN

## 2022-01-20 MED ORDER — ALBUTEROL SULFATE (2.5 MG/3ML) 0.083% IN NEBU: 3.0000 mL | INHALATION_SOLUTION | Freq: Four times a day (QID) | RESPIRATORY_TRACT | Status: DC | PRN

## 2022-01-20 MED ORDER — ROCURONIUM BROMIDE 10 MG/ML (PF) SYRINGE
PREFILLED_SYRINGE | INTRAVENOUS | Status: DC | PRN
  Administered 2022-01-20: 40 mg via INTRAVENOUS
  Administered 2022-01-20: 20 mg via INTRAVENOUS

## 2022-01-20 MED ORDER — LACTATED RINGERS IV SOLN: INTRAVENOUS | Status: DC

## 2022-01-20 MED ORDER — PROPOFOL 500 MG/50ML IV EMUL
INTRAVENOUS | Status: DC | PRN
  Administered 2022-01-20: 30 ug/kg/min via INTRAVENOUS

## 2022-01-20 MED ORDER — FENTANYL CITRATE PF 50 MCG/ML IJ SOSY
PREFILLED_SYRINGE | INTRAMUSCULAR | Status: AC
  Filled 2022-01-20: qty 2

## 2022-01-20 MED ORDER — CHLORHEXIDINE GLUCONATE 0.12 % MT SOLN
15.0000 mL | Freq: Once | OROMUCOSAL | Status: AC
  Administered 2022-01-20: 15 mL via OROMUCOSAL

## 2022-01-20 MED ORDER — ROCURONIUM BROMIDE 10 MG/ML (PF) SYRINGE
PREFILLED_SYRINGE | INTRAVENOUS | Status: AC
  Filled 2022-01-20: qty 10

## 2022-01-20 MED ORDER — EPHEDRINE SULFATE-NACL 50-0.9 MG/10ML-% IV SOSY
PREFILLED_SYRINGE | INTRAVENOUS | Status: DC | PRN
  Administered 2022-01-20: 10 mg via INTRAVENOUS

## 2022-01-20 MED ORDER — PHENYLEPHRINE 40 MCG/ML (10ML) SYRINGE FOR IV PUSH (FOR BLOOD PRESSURE SUPPORT)
PREFILLED_SYRINGE | INTRAVENOUS | Status: AC
  Filled 2022-01-20: qty 10

## 2022-01-20 MED ORDER — BUPIVACAINE-EPINEPHRINE (PF) 0.25% -1:200000 IJ SOLN
INTRAMUSCULAR | Status: AC
  Filled 2022-01-20: qty 30

## 2022-01-20 MED ORDER — GLYCOPYRROLATE 0.2 MG/ML IJ SOLN
INTRAMUSCULAR | Status: AC
  Filled 2022-01-20: qty 1

## 2022-01-20 MED ORDER — FENTANYL CITRATE (PF) 100 MCG/2ML IJ SOLN
INTRAMUSCULAR | Status: AC
  Filled 2022-01-20: qty 2

## 2022-01-20 MED ORDER — CHLORHEXIDINE GLUCONATE CLOTH 2 % EX PADS: 6.0000 | MEDICATED_PAD | Freq: Once | CUTANEOUS | Status: DC

## 2022-01-20 MED ORDER — ORAL CARE MOUTH RINSE: 15.0000 mL | Freq: Once | OROMUCOSAL | Status: AC

## 2022-01-20 MED ORDER — LIDOCAINE 2% (20 MG/ML) 5 ML SYRINGE
INTRAMUSCULAR | Status: DC | PRN
  Administered 2022-01-20: 60 mg via INTRAVENOUS

## 2022-01-20 MED ORDER — VITAMIN D3 25 MCG (1000 UNIT) PO TABS
1000.0000 [IU] | ORAL_TABLET | Freq: Every day | ORAL | Status: DC
  Administered 2022-01-20: 1000 [IU] via ORAL
  Filled 2022-01-20: qty 1

## 2022-01-20 MED ORDER — ONDANSETRON HCL 4 MG/2ML IJ SOLN
INTRAMUSCULAR | Status: AC
  Filled 2022-01-20: qty 2

## 2022-01-20 MED ORDER — FENTANYL CITRATE (PF) 100 MCG/2ML IJ SOLN
INTRAMUSCULAR | Status: DC | PRN
  Administered 2022-01-20 (×2): 50 ug via INTRAVENOUS

## 2022-01-20 MED ORDER — DEXAMETHASONE SODIUM PHOSPHATE 10 MG/ML IJ SOLN
INTRAMUSCULAR | Status: DC | PRN
  Administered 2022-01-20: 10 mg via INTRAVENOUS

## 2022-01-20 MED ORDER — SUGAMMADEX SODIUM 200 MG/2ML IV SOLN
INTRAVENOUS | Status: DC | PRN
  Administered 2022-01-20: 200 mg via INTRAVENOUS

## 2022-01-20 MED ORDER — PROPOFOL 500 MG/50ML IV EMUL
INTRAVENOUS | Status: AC
  Filled 2022-01-20: qty 50

## 2022-01-20 MED ORDER — ONDANSETRON HCL 4 MG/2ML IJ SOLN: 4.0000 mg | Freq: Four times a day (QID) | INTRAMUSCULAR | Status: DC | PRN

## 2022-01-20 MED ORDER — IBUPROFEN 400 MG PO TABS
600.0000 mg | ORAL_TABLET | Freq: Four times a day (QID) | ORAL | Status: DC | PRN
  Administered 2022-01-20: 600 mg via ORAL
  Filled 2022-01-20: qty 1

## 2022-01-20 MED ORDER — ONDANSETRON HCL 4 MG/2ML IJ SOLN
4.0000 mg | Freq: Once | INTRAMUSCULAR | Status: AC | PRN
  Administered 2022-01-20: 4 mg via INTRAVENOUS

## 2022-01-20 MED ORDER — POLYVINYL ALCOHOL 1.4 % OP SOLN
1.0000 [drp] | Freq: Two times a day (BID) | OPHTHALMIC | Status: DC | PRN
  Filled 2022-01-20: qty 15

## 2022-01-20 MED ORDER — PROPOFOL 10 MG/ML IV BOLUS
INTRAVENOUS | Status: DC | PRN
Start: 2022-01-20 — End: 2022-01-20
  Administered 2022-01-20: 100 mg via INTRAVENOUS

## 2022-01-20 MED ORDER — ACETAMINOPHEN 500 MG PO TABS
1000.0000 mg | ORAL_TABLET | ORAL | Status: AC
  Administered 2022-01-20: 1000 mg via ORAL
  Filled 2022-01-20: qty 2

## 2022-01-20 MED ORDER — MORPHINE SULFATE (PF) 2 MG/ML IV SOLN: 2.0000 mg | INTRAVENOUS | Status: DC | PRN

## 2022-01-20 MED ORDER — DEXAMETHASONE SODIUM PHOSPHATE 10 MG/ML IJ SOLN
INTRAMUSCULAR | Status: AC
  Filled 2022-01-20: qty 1

## 2022-01-20 MED ORDER — OXYCODONE HCL 5 MG PO TABS: 5.0000 mg | ORAL_TABLET | ORAL | Status: DC | PRN

## 2022-01-20 MED ORDER — BUPIVACAINE LIPOSOME 1.3 % IJ SUSP
INTRAMUSCULAR | Status: DC | PRN
  Administered 2022-01-20: 20 mL

## 2022-01-20 MED ORDER — PROPOFOL 10 MG/ML IV BOLUS
INTRAVENOUS | Status: AC
  Filled 2022-01-20: qty 20

## 2022-01-20 MED ORDER — BUPIVACAINE LIPOSOME 1.3 % IJ SUSP
INTRAMUSCULAR | Status: AC
  Filled 2022-01-20: qty 20

## 2022-01-20 MED ORDER — ONDANSETRON 4 MG PO TBDP: 4.0000 mg | ORAL_TABLET | Freq: Four times a day (QID) | ORAL | Status: DC | PRN

## 2022-01-20 MED ORDER — BUPIVACAINE-EPINEPHRINE (PF) 0.25% -1:200000 IJ SOLN
INTRAMUSCULAR | Status: DC | PRN
  Administered 2022-01-20: 30 mL

## 2022-01-20 MED ORDER — PRAVASTATIN SODIUM 20 MG PO TABS
20.0000 mg | ORAL_TABLET | Freq: Every day | ORAL | Status: DC
  Administered 2022-01-20: 20 mg via ORAL
  Filled 2022-01-20: qty 1

## 2022-01-20 MED ORDER — DIPHENHYDRAMINE HCL 12.5 MG/5ML PO ELIX: 12.5000 mg | ORAL_SOLUTION | Freq: Four times a day (QID) | ORAL | Status: DC | PRN

## 2022-01-20 MED ORDER — FENTANYL CITRATE PF 50 MCG/ML IJ SOSY
25.0000 ug | PREFILLED_SYRINGE | INTRAMUSCULAR | Status: DC | PRN
  Administered 2022-01-20: 50 ug via INTRAVENOUS

## 2022-01-20 MED ORDER — POLYETHYLENE GLYCOL 3350 17 G PO PACK: 17.0000 g | PACK | Freq: Every day | ORAL | Status: DC | PRN

## 2022-01-20 MED ORDER — CEFAZOLIN SODIUM-DEXTROSE 2-4 GM/100ML-% IV SOLN
2.0000 g | INTRAVENOUS | Status: AC
  Administered 2022-01-20: 2 g via INTRAVENOUS
  Filled 2022-01-20: qty 100

## 2022-01-20 MED ORDER — METOPROLOL TARTRATE 5 MG/5ML IV SOLN: 5.0000 mg | Freq: Four times a day (QID) | INTRAVENOUS | Status: DC | PRN

## 2022-01-20 SURGICAL SUPPLY — 37 items
APPLIER CLIP 5 13 M/L LIGAMAX5 (MISCELLANEOUS)
BENZOIN TINCTURE PRP APPL 2/3 (GAUZE/BANDAGES/DRESSINGS) IMPLANT
BNDG ADH 1X3 SHEER STRL LF (GAUZE/BANDAGES/DRESSINGS) IMPLANT
CABLE HIGH FREQUENCY MONO STRZ (ELECTRODE) ×2 IMPLANT
CHLORAPREP W/TINT 26 (MISCELLANEOUS) ×2 IMPLANT
CLIP APPLIE 5 13 M/L LIGAMAX5 (MISCELLANEOUS) IMPLANT
COVER SURGICAL LIGHT HANDLE (MISCELLANEOUS) ×2 IMPLANT
DECANTER SPIKE VIAL GLASS SM (MISCELLANEOUS) ×2 IMPLANT
DERMABOND ADVANCED (GAUZE/BANDAGES/DRESSINGS)
DERMABOND ADVANCED .7 DNX12 (GAUZE/BANDAGES/DRESSINGS) IMPLANT
DEVICE SECURE STRAP 25 ABSORB (INSTRUMENTS) ×2 IMPLANT
DEVICE TROCAR PUNCTURE CLOSURE (ENDOMECHANICALS) ×2 IMPLANT
ELECT REM PT RETURN 15FT ADLT (MISCELLANEOUS) ×1 IMPLANT
GLOVE SURG POLYISO LF SZ7 (GLOVE) ×2 IMPLANT
GLOVE SURG UNDER POLY LF SZ7 (GLOVE) ×2 IMPLANT
GOWN STRL REUS W/TWL LRG LVL3 (GOWN DISPOSABLE) ×2 IMPLANT
GOWN STRL REUS W/TWL XL LVL3 (GOWN DISPOSABLE) ×4 IMPLANT
GRASPER SUT TROCAR 14GX15 (MISCELLANEOUS) ×1 IMPLANT
IRRIG SUCT STRYKERFLOW 2 WTIP (MISCELLANEOUS)
IRRIGATION SUCT STRKRFLW 2 WTP (MISCELLANEOUS) IMPLANT
KIT BASIN OR (CUSTOM PROCEDURE TRAY) ×2 IMPLANT
KIT TURNOVER KIT A (KITS) IMPLANT
MARKER SKIN DUAL TIP RULER LAB (MISCELLANEOUS) ×2 IMPLANT
MESH 3DMAX 4X6 LT LRG (Mesh General) ×1 IMPLANT
MESH 3DMAX 4X6 RT LRG (Mesh General) ×1 IMPLANT
SCISSORS LAP 5X35 DISP (ENDOMECHANICALS) IMPLANT
SET TUBE SMOKE EVAC HIGH FLOW (TUBING) ×1 IMPLANT
SLEEVE XCEL OPT CAN 5 100 (ENDOMECHANICALS) ×3 IMPLANT
STRIP CLOSURE SKIN 1/2X4 (GAUZE/BANDAGES/DRESSINGS) IMPLANT
SUT MNCRL AB 4-0 PS2 18 (SUTURE) ×2 IMPLANT
SUT VICRYL 0 TIES 12 18 (SUTURE) ×1 IMPLANT
SUT VICRYL 0 UR6 27IN ABS (SUTURE) ×2 IMPLANT
TOWEL OR 17X26 10 PK STRL BLUE (TOWEL DISPOSABLE) ×2 IMPLANT
TRAY FOLEY MTR SLVR 16FR STAT (SET/KITS/TRAYS/PACK) ×2 IMPLANT
TRAY LAPAROSCOPIC (CUSTOM PROCEDURE TRAY) ×2 IMPLANT
TROCAR BLADELESS OPT 5 100 (ENDOMECHANICALS) ×2 IMPLANT
TROCAR XCEL 12X100 BLDLESS (ENDOMECHANICALS) ×2 IMPLANT

## 2022-01-20 NOTE — Op Note (Signed)
Preop diagnosis: right inguinal hernia  Postop diagnosis: bilateral inguinal hernia  Procedure: laparoscopic Bilateral inguinal hernia repair with mesh  Surgeon: Gurney Maxin, M.D.  Asst: Radonna Ricker, M.D.  Anesthesia: Gen.   Indications for procedure: Karen Bonilla is a 81 y.o. female with symptoms of pain and enlarging Right inguinal hernia(s). After discussing risks, alternatives and benefits she decided on laparoscopic repair and was brought to day surgery for repair.  Description of procedure: The patient was brought into the operative suite, placed supine. Anesthesia was administered with endotracheal tube. Patient was strapped in place. The patient was prepped and draped in the usual sterile fashion.  Next, a left subcostal incision was made. A 9mm trocar was used to gain access to the peritoneal cavity by optical entry technique. Pneumoperitoneum was applied with a high flow and low pressure. The laparoscope was reinserted to confirm position. There was a moderate amount of adhesive disease of the omentum to the mid abdominal wall. 1 5 mm trocar was placed in the left lateral space and the adhesions were taken down with harmonic scalpel. Next, 1 12 mm trocar was placed in the infraumbilical scap and 1 5 mm trocar was placed in the right lateral aspect. Bilateral TAP blocks were placed with Marcaine:Exparel mix. On visualization of the pelvis there was a moderate right direct inguinal hernia and a small left indirect inguinal hernia. Bilateral peritoneal flaps were created with harmonic scalpel down to the pelvic bone.  The right hernia was reduced and there was a small indirect hernia along the round ligament.  THe left hernia was reduced.  A large right 3D max mesh was inserted and tacked medially to the lacunar ligament. A large left 3D max mesh was inserted and tacked medially to the lacunar ligament. The mesh was positioned flat and directly up against the direct and indirect  areas. The flaps were re-apposed to the abdominal wall with additional tacks.  The CO2 was evacuated while watching to ensure the mesh did not migrate. The fascia of the 12 mm incision was closed with 0 vicryl by suture passer and all skin incisions were closed with 4-0 monocryl subcu stitch. The patient awoke from anesthesia and was brought to PACU in stable condition.  Findings: bilateral direct inguinal hernia  Specimen: none  Blood loss: 10 ml  Local anesthesia: 50 ml Exparel:Marcaine mix  Complications: none  Implant: large right and large left Bard 3D max mesh  Images:     Gurney Maxin, M.D. General, Bariatric, & Minimally Invasive Surgery [facility] Surgery, [place] 10:43 AM 01/20/2022

## 2022-01-20 NOTE — Anesthesia Preprocedure Evaluation (Addendum)
Anesthesia Evaluation  Patient identified by MRN, date of birth, ID band Patient awake    Reviewed: Allergy & Precautions, NPO status , Patient's Chart, lab work & pertinent test results  Airway Mallampati: II  TM Distance: >3 FB Neck ROM: Full    Dental no notable dental hx.    Pulmonary COPD,  COPD inhaler, Current Smoker and Patient abstained from smoking.,    Pulmonary exam normal breath sounds clear to auscultation       Cardiovascular + CAD and + Peripheral Vascular Disease  Normal cardiovascular exam Rhythm:Regular Rate:Normal     Neuro/Psych negative neurological ROS  negative psych ROS   GI/Hepatic negative GI ROS, Neg liver ROS,   Endo/Other  negative endocrine ROS  Renal/GU negative Renal ROS     Musculoskeletal  (+) Arthritis ,   Abdominal   Peds  Hematology negative hematology ROS (+)   Anesthesia Other Findings RIGHT INGUINAL HERNIA  Reproductive/Obstetrics                           Anesthesia Physical Anesthesia Plan  ASA: 3  Anesthesia Plan: General   Post-op Pain Management:    Induction: Intravenous  PONV Risk Score and Plan: 2 and Ondansetron, Dexamethasone, Propofol infusion and Treatment may vary due to age or medical condition  Airway Management Planned: Oral ETT  Additional Equipment:   Intra-op Plan:   Post-operative Plan: Extubation in OR  Informed Consent: I have reviewed the patients History and Physical, chart, labs and discussed the procedure including the risks, benefits and alternatives for the proposed anesthesia with the patient or authorized representative who has indicated his/her understanding and acceptance.     Dental advisory given  Plan Discussed with: CRNA  Anesthesia Plan Comments:        Anesthesia Quick Evaluation

## 2022-01-20 NOTE — H&P (Signed)
Subjective   Chief Complaint: Right Inguinal Hernia  History of Present Illness: Karen Bonilla is a 81 y.o. female who is seen today as an office consultation at the request of Dr. Jacelyn Grip for evaluation of Right Inguinal Hernia .  She first noticed the hernia 1 year ago. Symptoms are bulge on the right and occasional discomfort. She denies nausea or vomiting or bowel habit change.  She does smoke She does not have diabetes She has no history of hernias  Review of Systems: A complete review of systems was obtained from the patient. I have reviewed this information and discussed as appropriate with the patient. See HPI as well for other ROS.  Review of Systems  Constitutional: Negative.  HENT: Negative.  Eyes: Negative.  Respiratory: Negative.  Cardiovascular: Negative.  Gastrointestinal: Negative.  Genitourinary: Negative.  Musculoskeletal: Negative.  Skin: Negative.  Neurological: Negative.  Endo/Heme/Allergies: Negative.  Psychiatric/Behavioral: Negative.    Medical History: Past Medical History:  Diagnosis Date   COPD (chronic obstructive pulmonary disease) (CMS-HCC)   There is no problem list on file for this patient.  Past Surgical History:  Procedure Laterality Date   APPENDECTOMY   HYSTERECTOMY   Minor Excision of Mass 01/11/2012  Dr. Luisa Dago.    No Known Allergies  Current Outpatient Medications on File Prior to Visit  Medication Sig Dispense Refill   albuterol 90 mcg/actuation inhaler Inhale into the lungs   alendronate (FOSAMAX) 70 MG tablet   cholecalciferol (VITAMIN D3) 2,000 unit tablet Take by mouth   ibuprofen (MOTRIN) 200 MG tablet Take by mouth   pravastatin (PRAVACHOL) 20 MG tablet Take 20 mg by mouth once daily   No current facility-administered medications on file prior to visit.   Family History  Problem Relation Age of Onset   Breast cancer Sister    Social History   Tobacco Use  Smoking Status Current Every Day Smoker    Packs/day: 1.00  Smokeless Tobacco Never Used    Social History   Socioeconomic History   Marital status: Divorced  Tobacco Use   Smoking status: Current Every Day Smoker  Packs/day: 1.00   Smokeless tobacco: Never Used  Substance and Sexual Activity   Alcohol use: Yes  Alcohol/week: 7.0 standard drinks  Types: 7 Glasses of wine per week   Drug use: Never   Objective:   Vitals:  09/13/21 1549  Pulse: 95  Temp: 36.7 C (98.1 F)  SpO2: 95%  Weight: 69.3 kg (152 lb 12.8 oz)  Height: 168.9 cm (5' 6.5")   Body mass index is 24.29 kg/m.  Physical Exam Constitutional:  Appearance: Normal appearance.  HENT:  Head: Normocephalic and atraumatic.  Pulmonary:  Effort: Pulmonary effort is normal.  Abdominal:  Comments: Right sided bulge  Musculoskeletal:  General: Normal range of motion.  Cervical back: Normal range of motion.  Neurological:  General: No focal deficit present.  Mental Status: She is alert and oriented to person, place, and time. Mental status is at baseline.  Psychiatric:  Mood and Affect: Mood normal.  Behavior: Behavior normal.  Thought Content: Thought content normal.     Labs, Imaging and Diagnostic Testing: I reviewed notes by Yaakov Guthrie. I reviewed  Assessment and Plan:  Diagnoses and all orders for this visit:  Non-recurrent bilateral inguinal hernia without obstruction or gangrene - [facility] Case Posting Request; Future  Tobacco abuse   We discussed etiology of hernias and how they can cause pain. We discussed options for inguinal hernia repair vs observation.  We discussed details of the surgery of general anesthesia, surgical approach and incisions, dissecting the sack away from structures in the area and nerves and placement of mesh. We discussed risks of bleeding, infection, recurrence, injury to structures in the area, nerve injury, and chronic pain. She showed good understanding and wanted proceed with laparoscopic right, possible left  inguinal hernia repair with observation stay.

## 2022-01-20 NOTE — Progress Notes (Signed)
°  Transition of Care [facility]) Screening Note   Patient Details  Name: Karen Bonilla Date of Birth: [birth year]   Transition of Care [facility]) CM/SW Contact:    Lennart Pall, LCSW Phone Number: 01/20/2022, 2:53 PM    Transition of Care Department [facility]) has reviewed patient and no TOC needs have been identified at this time. We will continue to monitor patient advancement through interdisciplinary progression rounds. If new patient transition needs arise, please place a TOC consult.

## 2022-01-20 NOTE — Anesthesia Procedure Notes (Addendum)
Procedure Name: Intubation Date/Time: 01/20/2022 9:12 AM Performed by: Gerald Leitz, CRNA Pre-anesthesia Checklist: Patient identified, Patient being monitored, Timeout performed, Emergency Drugs available and Suction available Patient Re-evaluated:Patient Re-evaluated prior to induction Oxygen Delivery Method: Circle system utilized Preoxygenation: Pre-oxygenation with 100% oxygen Induction Type: IV induction Ventilation: Mask ventilation without difficulty Laryngoscope Size: Mac and 3 Grade View: Grade I Tube type: Oral Tube size: 7.0 mm Number of attempts: 1 Airway Equipment and Method: Stylet Placement Confirmation: ETT inserted through vocal cords under direct vision, positive ETCO2 and breath sounds checked- equal and bilateral Secured at: 21 cm Tube secured with: Tape Dental Injury: Teeth and Oropharynx as per pre-operative assessment  Comments: Soft palate mass noted in oropharynx.

## 2022-01-20 NOTE — Transfer of Care (Signed)
Immediate Anesthesia Transfer of Care Note  Patient: Karen Bonilla  Procedure(s) Performed: Procedure(s): LAPAROSCOPIC BILATERAL INGUINAL HERNIA REPAIR WITH MESH (Bilateral)  Patient Location: PACU  Anesthesia Type:General  Level of Consciousness: Alert, Awake, Oriented  Airway & Oxygen Therapy: Patient Spontanous Breathing  Post-op Assessment: Report given to RN  Post vital signs: Reviewed and stable  Last Vitals:  Vitals:   01/20/22 0745 01/20/22 1055  BP: (!) 148/71   Pulse: (!) 57 81  Resp: 17 19  Temp: 36.6 C   SpO2: 80% 998%    Complications: No apparent anesthesia complications

## 2022-01-20 NOTE — Anesthesia Postprocedure Evaluation (Signed)
Anesthesia Post Note  Patient: Karen Bonilla  Procedure(s) Performed: LAPAROSCOPIC BILATERAL INGUINAL HERNIA REPAIR WITH MESH (Bilateral)     Patient location during evaluation: PACU Anesthesia Type: General Level of consciousness: awake Pain management: pain level controlled Vital Signs Assessment: post-procedure vital signs reviewed and stable Respiratory status: spontaneous breathing, nonlabored ventilation, respiratory function stable and patient connected to nasal cannula oxygen Cardiovascular status: blood pressure returned to baseline and stable Postop Assessment: no apparent nausea or vomiting Anesthetic complications: no   No notable events documented.  Last Vitals:  Vitals:   01/20/22 1500 01/20/22 1600  BP: 138/86 (!) 142/65  Pulse: 69 (!) 59  Resp: 18 18  Temp: 36.5 C (!) 36.4 C  SpO2: 100% 100%    Last Pain:  Vitals:   01/20/22 1500  TempSrc:   PainSc: 1                  Yerlin Gasparyan P [place]

## 2022-01-21 MED ORDER — TRAMADOL HCL 50 MG PO TABS: 50.0000 mg | ORAL_TABLET | Freq: Four times a day (QID) | ORAL | 0 refills | Status: AC | PRN

## 2022-01-21 NOTE — Progress Notes (Signed)
Reviewed written d/c instructions w pt and all questions answered. She verbalized understanding. D/C via w/c w all belongings in stable condition. 

## 2022-01-21 NOTE — Discharge Summary (Signed)
Physician Discharge Summary  Patient ID: Karen Bonilla MRN: [id number] DOB/AGE: August 05, 1941 81 y.o.  Admit date: 01/20/2022 Discharge date: 01/21/2022  Admission Diagnoses: Patient Active Problem List   Diagnosis Date Noted   Bilateral inguinal hernia 01/20/2022   Anosmia 02/02/2021   Degeneration of lumbar intervertebral disc 02/02/2021   Hardening of the aorta (main artery of the heart) ([place]) 02/02/2021   Hypercholesterolemia 02/02/2021   Other seasonal allergic rhinitis 02/02/2021   Other specified disorders of bone density and structure, other site 02/02/2021   Personal history of colonic polyps 02/02/2021   Chronic obstructive pulmonary disease ([place]) 02/02/2021   Senile purpura ([place]) 02/02/2021   Spinal stenosis 02/02/2021   Abnormal findings on diagnostic imaging of other specified body structures 02/02/2021   Coronary artery calcification 03/19/2018   Ganglion of left wrist 03/07/2017   Varicose veins of lower extremities with other complications 41/28/7867    Discharge Diagnoses:  Principal Problem:   Bilateral inguinal hernia  And same as above  Discharged Condition: stable  Hospital Course:  Patient was admitted for observation following a laparoscopic bilateral inguinal hernia 10/20/21.  The right side was anticipated, and the left side was a possible, but after both sides were done, she had a bit more pain initially than was anticipated.  She did well overnight.  She had no n/v.  She tolerated a diet.  She was ambulatory.  Her biggest complaint on day of discharge was irritation from  the IV site in her left hand.  She was able to void spontaneously and was passing gas.    Consults: None  Significant Diagnostic Studies: labs: none post op  Treatments: surgery: see above  Discharge Exam: Blood pressure (!) 120/56, pulse 63, temperature 98.2 F (36.8 C), temperature source Oral, resp. rate 18, height 5' 6.5" (1.689 m), weight 67.1 kg, SpO2 95 %. General  appearance: alert, cooperative, and no distress Resp: breathing comfortably GI: soft, non distended, non tender.  Some bruising at umbilical incision.  Remaining incisions unremarkable Extremities: extremities normal, atraumatic, no cyanosis or edema and left hand iv without any evidence of infection.    Disposition: Discharge disposition: 01-Home or Self Care       Discharge Instructions     Call MD for:  difficulty breathing, headache or visual disturbances   Complete by: As directed    Call MD for:  hives   Complete by: As directed    Call MD for:  persistant nausea and vomiting   Complete by: As directed    Call MD for:  redness, tenderness, or signs of infection (pain, swelling, redness, odor or green/yellow discharge around incision site)   Complete by: As directed    Call MD for:  severe uncontrolled pain   Complete by: As directed    Call MD for:  temperature >100.4   Complete by: As directed    Diet - low sodium heart healthy   Complete by: As directed    Increase activity slowly   Complete by: As directed       Allergies as of 01/21/2022   No Known Allergies      Medication List     TAKE these medications    albuterol 108 (90 Base) MCG/ACT inhaler Commonly known as: VENTOLIN HFA Inhale into the lungs every 6 (six) hours as needed for wheezing or shortness of breath.   cholecalciferol 25 MCG (1000 UNIT) tablet Commonly known as: VITAMIN D Take 1,000 Units by mouth at bedtime.   ibuprofen  200 MG tablet Commonly known as: ADVIL Take 1 tablet (200 mg total) by mouth every 4 (four) hours as needed for pain.   naproxen sodium 220 MG tablet Commonly known as: ALEVE Take 220 mg by mouth 2 (two) times daily as needed (pain.).   pravastatin 20 MG tablet Commonly known as: PRAVACHOL Take 20 mg by mouth at bedtime.   Systane 0.4-0.3 % Soln Generic drug: Polyethyl Glycol-Propyl Glycol Place 1-2 drops into both eyes 2 (two) times daily as needed  (dry/irritated eyes.).   traMADol 50 MG tablet Commonly known as: ULTRAM Take 1 tablet (50 mg total) by mouth every 6 (six) hours as needed for moderate pain or severe pain.        Follow-up Information     Kinsinger, Arta Bruce, MD Follow up in 3 week(s).   Specialty: General Surgery Why: or as scheduled already. Contact information: [place] [address] [phone]                 Signed: Stark Klein 01/21/2022, 9:49 AM

## 2022-01-23 ENCOUNTER — Encounter (HOSPITAL_COMMUNITY): Payer: Self-pay | Admitting: General Surgery

## 2022-02-14 DIAGNOSIS — J449 Chronic obstructive pulmonary disease, unspecified: Secondary | ICD-10-CM | POA: Diagnosis not present

## 2022-02-14 DIAGNOSIS — M5136 Other intervertebral disc degeneration, lumbar region: Secondary | ICD-10-CM | POA: Diagnosis not present

## 2022-02-14 DIAGNOSIS — E7800 Pure hypercholesterolemia, unspecified: Secondary | ICD-10-CM | POA: Diagnosis not present

## 2022-02-14 DIAGNOSIS — M4800 Spinal stenosis, site unspecified: Secondary | ICD-10-CM | POA: Diagnosis not present

## 2022-02-14 DIAGNOSIS — I2510 Atherosclerotic heart disease of native coronary artery without angina pectoris: Secondary | ICD-10-CM | POA: Diagnosis not present

## 2022-02-14 DIAGNOSIS — I700 Atherosclerosis of aorta: Secondary | ICD-10-CM | POA: Diagnosis not present

## 2022-02-14 DIAGNOSIS — M8588 Other specified disorders of bone density and structure, other site: Secondary | ICD-10-CM | POA: Diagnosis not present

## 2022-02-14 DIAGNOSIS — D692 Other nonthrombocytopenic purpura: Secondary | ICD-10-CM | POA: Diagnosis not present

## 2022-02-14 DIAGNOSIS — J439 Emphysema, unspecified: Secondary | ICD-10-CM | POA: Diagnosis not present

## 2022-02-21 ENCOUNTER — Ambulatory Visit: Payer: Medicare PPO | Admitting: Physician Assistant

## 2022-02-28 IMAGING — US US PELVIS LIMITED
1 series · 14 of 17 positions shown · non-contrast
Comparison: None.

CLINICAL DATA: Swelling of inguinal region.

EXAM:
US PELVIS LIMITED
TECHNIQUE: Grayscale images were obtained.

[Series 1: us pelvis limited · 17 acquisitions, 14 frames shown]
[im 1/17]
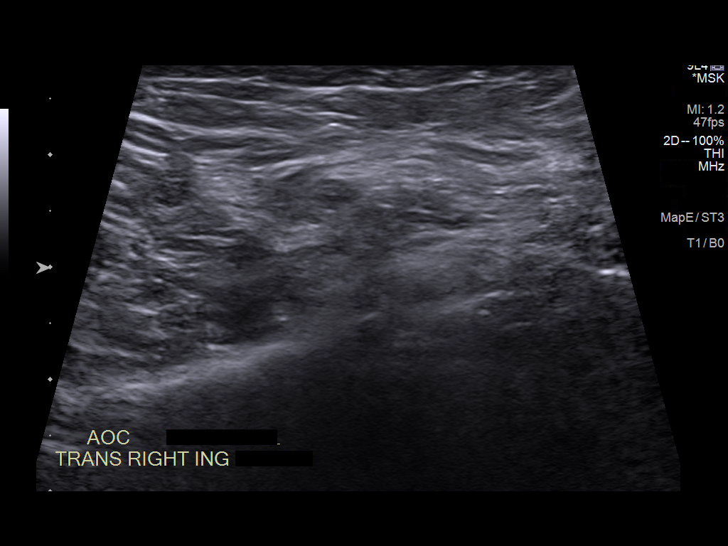
[im 2/17]
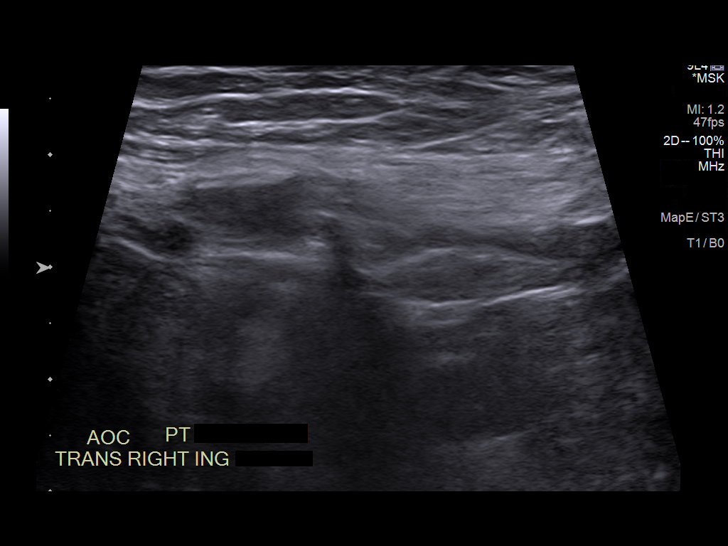
[im 4/17]
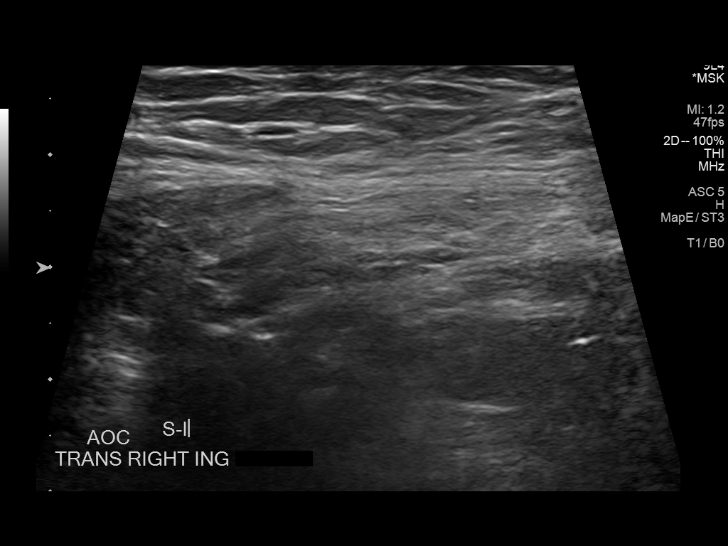
[im 5/17]
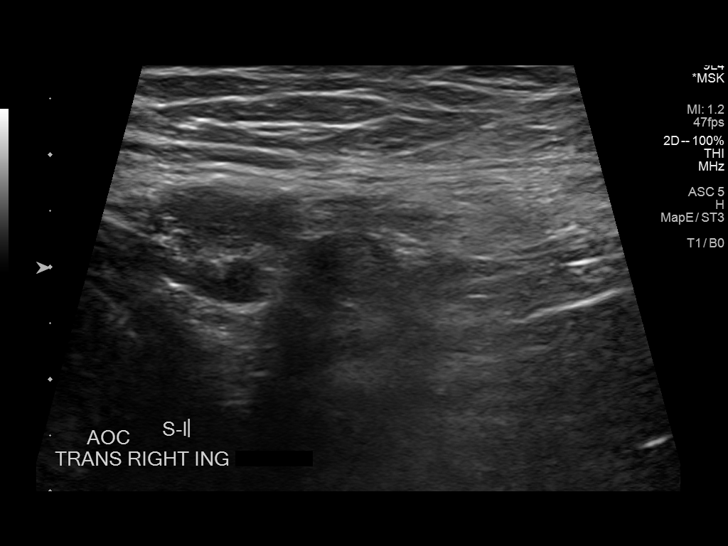
[im 6/17]
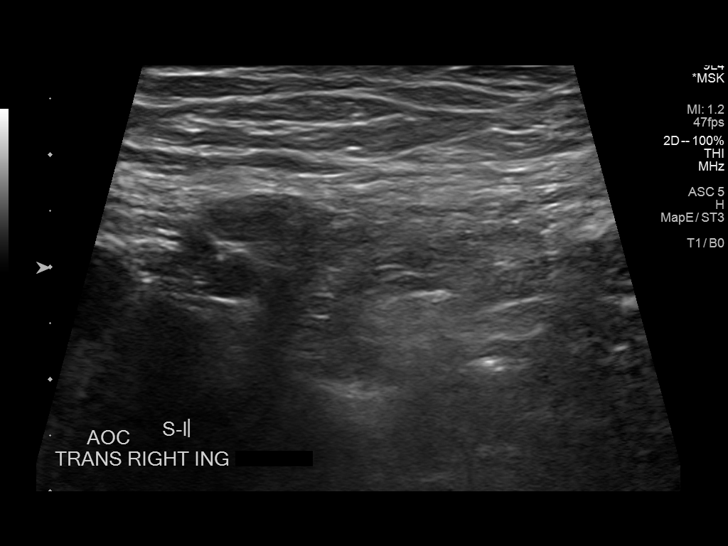
[im 7/17]
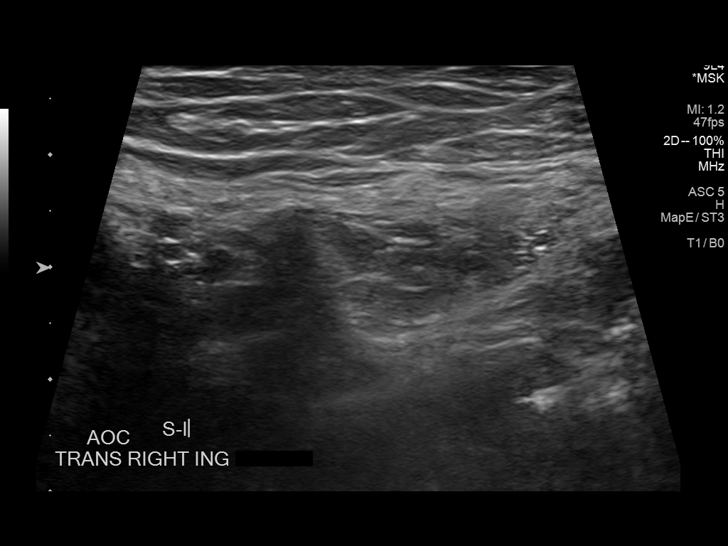
[im 8/17]
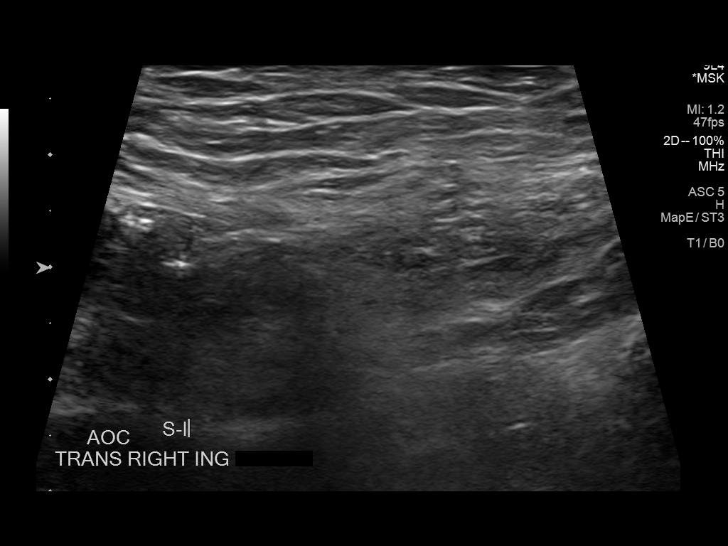
[im 10/17]
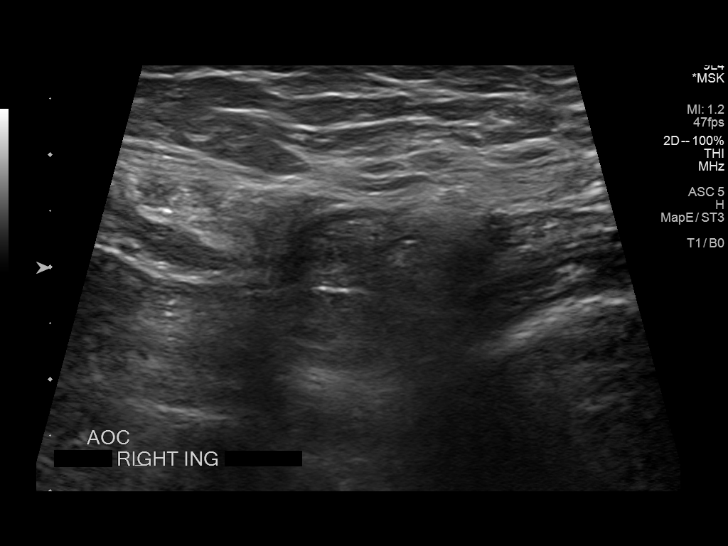
[im 11/17]
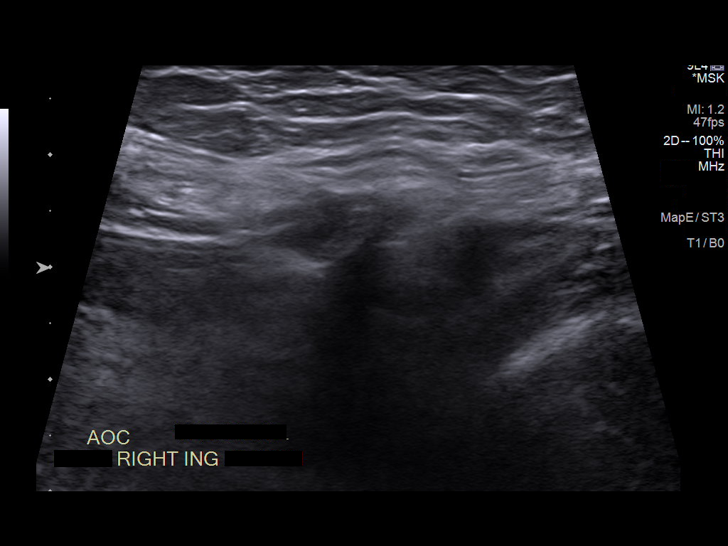
[im 12/17]
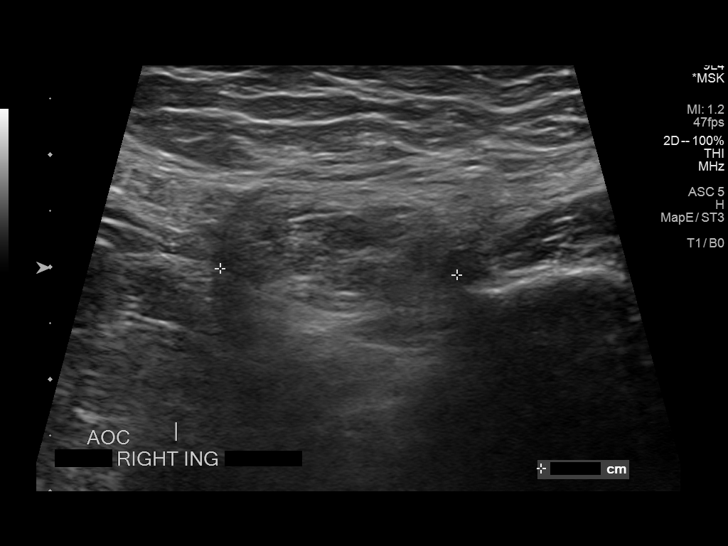
[im 13/17]
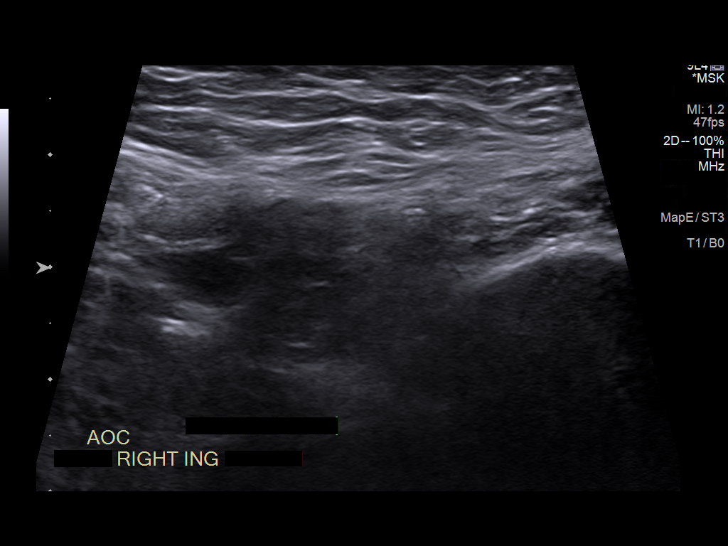
[im 14/17]
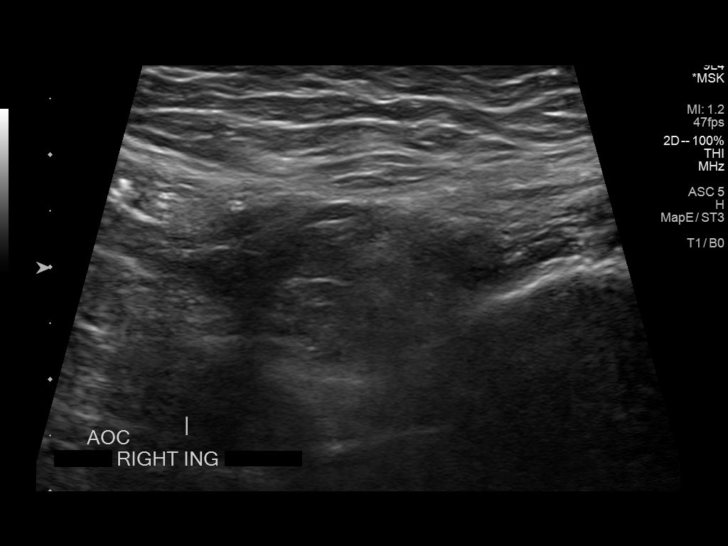
[im 16/17]
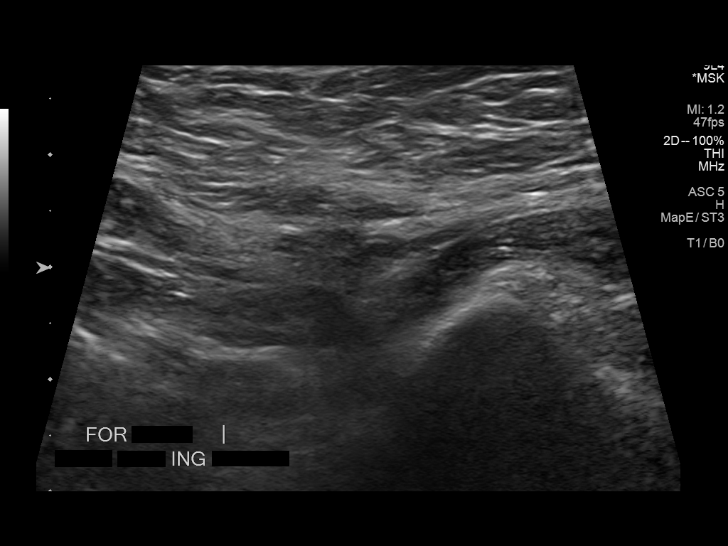
[im 17/17]
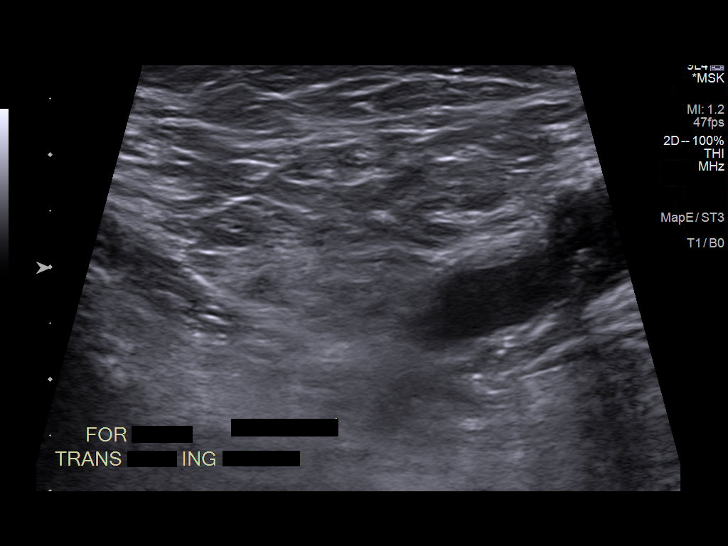

[14 of 17 positions shown; findings below may reference images not displayed]

FINDINGS: A right-sided inguinal hernia containing bowel and fat is
identified. The left was imaged for comparison and is unremarkable.
IMPRESSION: There is a right-sided inguinal hernia containing bowel and fat.

## 2022-03-03 DIAGNOSIS — J019 Acute sinusitis, unspecified: Secondary | ICD-10-CM | POA: Diagnosis not present

## 2022-03-03 DIAGNOSIS — J209 Acute bronchitis, unspecified: Secondary | ICD-10-CM | POA: Diagnosis not present

## 2022-03-03 DIAGNOSIS — R059 Cough, unspecified: Secondary | ICD-10-CM | POA: Diagnosis not present

## 2022-03-03 DIAGNOSIS — Z8709 Personal history of other diseases of the respiratory system: Secondary | ICD-10-CM | POA: Diagnosis not present

## 2022-03-13 ENCOUNTER — Other Ambulatory Visit: Payer: Self-pay

## 2022-03-13 ENCOUNTER — Ambulatory Visit: Payer: Medicare PPO | Admitting: Interventional Cardiology

## 2022-03-13 ENCOUNTER — Encounter: Payer: Self-pay | Admitting: Interventional Cardiology

## 2022-03-13 VITALS — BP 130/74 | HR 90 | Ht 66.5 in | Wt 150.0 lb

## 2022-03-13 DIAGNOSIS — Z720 Tobacco use: Secondary | ICD-10-CM

## 2022-03-13 DIAGNOSIS — I2584 Coronary atherosclerosis due to calcified coronary lesion: Secondary | ICD-10-CM | POA: Diagnosis not present

## 2022-03-13 DIAGNOSIS — E782 Mixed hyperlipidemia: Secondary | ICD-10-CM

## 2022-03-13 DIAGNOSIS — I700 Atherosclerosis of aorta: Secondary | ICD-10-CM | POA: Diagnosis not present

## 2022-03-13 DIAGNOSIS — I2510 Atherosclerotic heart disease of native coronary artery without angina pectoris: Secondary | ICD-10-CM

## 2022-03-13 NOTE — Progress Notes (Signed)
?  ?Cardiology Office Note ? ? ?Date:  03/13/2022  ? ?ID:  Karen Bonilla, DOB 26-Jul-1941, MRN [id number] ? ?PCP:  Vernie Shanks, MD  ? ? ?Chief Complaint  ?Patient presents with  ? Establish Care  ? ?Coronary artery calcification, aortic atherosclerosis ? ?Wt Readings from Last 3 Encounters:  ?03/13/22 150 lb (68 kg)  ?01/20/22 148 lb (67.1 kg)  ?01/09/22 148 lb (67.1 kg)  ?  ? ?  ?History of Present Illness: ?Karen Bonilla is a 81 y.o. female  with a history of tobacco abuse.  She underwent screening CT scan for lung cancer.  This revealed atherosclerotic calcifications in the coronary arteries and aortic valve. ?  ?February 2019 CT scan findings as follows:  ?Cardiovascular: Atherosclerotic calcification of the arterial ?vasculature, including coronary arteries and aortic valve. Heart ?size normal. No pericardial effusion. ?  ?No lung CA.  Emphysema noted.  ?  ?I saw Karen Bonilla many years ago.  She underwent stress testing in January 2011.  At that time, there is no evidence of ischemia.  Ejection fraction was 75% by nuclear stress test.  Exercise capacity was 10 METs.  Between 2011 and 2019, she lost 20 lbs. ?  ?She is also had carotid artery testing done showing minimal carotid artery disease bilaterally in 2010.  In 2010 she also had abdominal aorta screening due to her tobacco history.  There is no evidence of abdominal aortic aneurysm. ?  ?She continued to smoke as of 2019.  ? ?Did well with hernia surgery in Jan 2023.   ? ?Jan 2023 lung cancer screening showed: ?"Cardiovascular: Heart size is normal. There is no significant ?pericardial fluid, thickening or pericardial calcification. There is ?aortic atherosclerosis, as well as atherosclerosis of the great ?vessels of the mediastinum and the coronary arteries, including ?calcified atherosclerotic plaque in the left anterior descending, ?left circumflex and right coronary arteries. Mild calcifications of ?the aortic valve." ?Aortic atherosclerosis ? ?Denies :  Chest pain. Dizziness. Leg edema. Nitroglycerin use. Orthopnea. Palpitations. Paroxysmal nocturnal dyspnea. Shortness of breath. Syncope.   ? ?Only felt poorly when she had bronchitis.   ? ?She is cleared to walk more.  Not back to walking fully.  Did go to water aerobics today and felt well.  ?She still smokes.  ? ? ? ?Past Medical History:  ?Diagnosis Date  ? Abnormal chest CT   ? Anemia   ? Anosmia   ? Aortic atherosclerosis ([place])   ? Arthritis   ? Back pain of lumbar region with sciatica   ? Basal cell carcinoma 09/01/2002  ? right shin tx exc  ? BCC (basal cell carcinoma of skin) 06/20/2013  ? right forearm TX WITH BX  ? BCC (basal cell carcinoma of skin) 06/20/2013  ? right upper lip TX WITH BX  ? BCC (basal cell carcinoma of skin) 10/03/2016  ? mid upper back [place]  ? CAD (coronary artery disease)   ? Colon polyps   ? COPD (chronic obstructive pulmonary disease) ([place])   ? DDD (degenerative disc disease), lumbar   ? Diverticulosis large intestine w/o perforation or abscess w/o bleeding   ? Emphysema, unspecified ([place])   ? History of colonic polyps   ? Hypercholesterolemia   ? Hyperlipidemia   ? Inguinal hernia   ? Mucoid cyst of joint   ? SCC (squamous cell carcinoma) 01/23/2011  ? left forearm scc/ka  TX CX3 5FU CAUTERY  ? SCC (squamous cell carcinoma) 10/03/2016  ? right  shin scc/ka TX CX3 5FU CAUTERY  ? SCC (squamous cell carcinoma) 07/29/2019  ? left forehead scc TX CX3 5FU CAUTERY  ? Scoliosis of lumbar spine, unspecified scoliosis type   ? Seasonal allergies   ? Senile purpura ([place])   ? Spinal stenosis   ? Spinal stenosis   ? Squamous cell carcinoma of skin 10/23/2005  ? left lower shin inf. bowens  TX CX3 5FU CAUTERY  ? Squamous cell carcinoma of skin 08/06/2020  ? in situ- right lower leg (CX35FU)  ? Squamous cell carcinoma of skin 08/06/2020  ? in situ- right lower leg- posterior lower (CX35FU)  ? Varicose veins of lower extremities with other complications 02/63/7858  ? ? ?Past Surgical History:   ?Procedure Laterality Date  ? ABDOMINAL HYSTERECTOMY    ? APPENDECTOMY    ? INGUINAL HERNIA REPAIR Bilateral 01/20/2022  ? Procedure: LAPAROSCOPIC BILATERAL INGUINAL HERNIA REPAIR WITH MESH;  Surgeon: Kinsinger, Arta Bruce, MD;  Location: [facility];  Service: General;  Laterality: Bilateral;  ? MASS EXCISION  01/11/2012  ? Procedure: MINOR EXCISION OF MASS;  Surgeon: Cammie Sickle., MD;  Location: [facility];  Service: Orthopedics;  Laterality: Right;  excision mucoid cyst right index and dip joint debridement  ? ? ? ?Current Outpatient Medications  ?Medication Sig Dispense Refill  ? albuterol (PROVENTIL HFA;VENTOLIN HFA) 108 (90 BASE) MCG/ACT inhaler Inhale into the lungs every 6 (six) hours as needed for wheezing or shortness of breath.    ? calcium carbonate (TUMS - DOSED IN MG ELEMENTAL CALCIUM) 500 MG chewable tablet Chew 1 tablet by mouth daily as needed.    ? cholecalciferol (VITAMIN D) 25 MCG (1000 UNIT) tablet Take 1,000 Units by mouth at bedtime.    ? ibuprofen (ADVIL,MOTRIN) 200 MG tablet Take 1 tablet (200 mg total) by mouth every 4 (four) hours as needed for pain. 30 tablet 0  ? naproxen sodium (ALEVE) 220 MG tablet Take 220 mg by mouth daily as needed (pain.).    ? Polyethyl Glycol-Propyl Glycol (SYSTANE) 0.4-0.3 % SOLN Place 1-2 drops into both eyes 2 (two) times daily as needed (dry/irritated eyes.).    ? pravastatin (PRAVACHOL) 20 MG tablet Take 20 mg by mouth at bedtime.    ? traMADol (ULTRAM) 50 MG tablet Take 1 tablet (50 mg total) by mouth every 6 (six) hours as needed for moderate pain or severe pain. 10 tablet 0  ? alendronate (FOSAMAX) 70 MG tablet Take 70 mg by mouth once a week.    ? ?No current facility-administered medications for this visit.  ? ? ?Allergies:   Patient has no known allergies.  ? ? ?Social History:  The patient  reports that she has been smoking cigarettes. She started smoking about 65 years ago. She has a 60.00 pack-year smoking history. She has never  used smokeless tobacco. She reports current alcohol use of about 5.0 standard drinks per week. She reports that she does not use drugs.  ? ?Family History:  The patient's family history includes Hypertension in her father and mother; Other in her father.  ? ? ?ROS:  Please see the history of present illness.   Otherwise, review of systems are positive for fatigue post bronchitis.   All other systems are reviewed and negative.  ? ? ?PHYSICAL EXAM: ?VS:  BP 130/74   Pulse 90   Ht 5' 6.5" (1.689 m)   Wt 150 lb (68 kg)   SpO2 99%   BMI 23.85 kg/m?  ,  BMI Body mass index is 23.85 kg/m?. ?GEN: Well nourished, well developed, in no acute distress ?HEENT: normal ?Neck: no JVD, carotid bruits, or masses ?Cardiac: RRR; no murmurs, rubs, or gallops,no edema  ?Respiratory:  clear to auscultation bilaterally, normal work of breathing ?GI: soft, nontender, nondistended, + BS ?MS: no deformity or atrophy ?Skin: warm and dry, no rash ?Neuro:  Strength and sensation are intact ?Psych: euthymic mood, full affect ? ? ?EKG:   ?The ekg ordered today demonstrates normal ECG ? ? ?Recent Labs: ?01/09/2022: Hemoglobin 14.4; Platelets 288  ? ?Lipid Panel ?No results found for: CHOL, TRIG, HDL, CHOLHDL, VLDL, LDLCALC, LDLDIRECT ?  ?Other studies Reviewed: ?Additional studies/ records that were reviewed today with results demonstrating: labs reviewed. ? ? ?ASSESSMENT AND PLAN: ? ?Coronary artery calcium: Noted in the past as well.  No angina on medical therapy.  Tolerated hernia surgery well.  Given the lack of symptoms  would not pursue any stress testing at this time continue statin.  Stopped aspirin due to easy bruising. Restart aspirin 81 mg daily.  SHe will likely restart 3x/week.  I spoke about healthy diet.  She should try to minimize red meat.  She is accustomed to eating beef and potatoes regularly. ?Aortic atherosclerosis: Continue pravastatin.  Whole food, plant-based diet.  High-fiber food.  Avoid processed  foods. ?Hyperlipidemia: In June 2022 total cholesterol 145 HDL 56 LDL 78 triglycerides 53.  COntinue pravastatin.   ?Tobacco abuse: Needs to stop smoking.  She does not want to stop now.  ? ? ?Current medicines are reviewed at

## 2022-03-13 NOTE — Patient Instructions (Signed)

## 2022-03-16 ENCOUNTER — Ambulatory Visit: Payer: Medicare PPO | Admitting: Physician Assistant

## 2022-03-16 ENCOUNTER — Encounter: Payer: Self-pay | Admitting: Physician Assistant

## 2022-03-16 ENCOUNTER — Other Ambulatory Visit: Payer: Self-pay

## 2022-03-16 DIAGNOSIS — Z85828 Personal history of other malignant neoplasm of skin: Secondary | ICD-10-CM

## 2022-03-16 DIAGNOSIS — Z808 Family history of malignant neoplasm of other organs or systems: Secondary | ICD-10-CM | POA: Diagnosis not present

## 2022-03-16 DIAGNOSIS — Z1283 Encounter for screening for malignant neoplasm of skin: Secondary | ICD-10-CM | POA: Diagnosis not present

## 2022-03-16 DIAGNOSIS — L820 Inflamed seborrheic keratosis: Secondary | ICD-10-CM

## 2022-04-06 ENCOUNTER — Encounter: Payer: Self-pay | Admitting: Physician Assistant

## 2022-04-06 NOTE — Progress Notes (Signed)
? ?  Follow-Up Visit ?  ?Subjective  ?Karen Bonilla is a 81 y.o. female who presents for the following: Annual Exam (6 month recheck- left chest- keeps picking it. Family & Personal history of non mole skin cancer but no melanoma. ). ? ? ?The following portions of the chart were reviewed this encounter and updated as appropriate:  Tobacco  Allergies  Meds  Problems  Med Hx  Surg Hx  Fam Hx   ?  ? ?Objective  ?Well appearing patient in no apparent distress; mood and affect are within normal limits. ? ?A full examination was performed including scalp, head, eyes, ears, nose, lips, neck, chest, axillae, abdomen, back, buttocks, bilateral upper extremities, bilateral lower extremities, hands, feet, fingers, toes, fingernails, and toenails. All findings within normal limits unless otherwise noted below. ? ?No atypical nevi or signs of NMSC noted at the time of the visit.  ? ?Chest - Medial ([place]) (15) ?Crusted plaques on an erythematous base.  ? ? ?Assessment & Plan  ?Encounter for screening for malignant neoplasm of skin ? ?Yearly skin examination ? ?Seborrheic keratosis, inflamed (15) ?Chest - Medial [facility]) ? ?Destruction of lesion - Chest - Medial [facility]) ?Complexity: simple   ?Destruction method: cryotherapy   ?Informed consent: discussed and consent obtained   ?Timeout:  patient name, date of birth, surgical site, and procedure verified ?Lesion destroyed using liquid nitrogen: Yes   ?Cryotherapy cycles:  3 ?Outcome: patient tolerated procedure well with no complications   ? ? ? ?I, Hadia Minier, PA-C, have reviewed all documentation's for this visit.  The documentation on 04/06/22 for the exam, diagnosis, procedures and orders are all accurate and complete. ?

## 2022-06-08 DIAGNOSIS — H25013 Cortical age-related cataract, bilateral: Secondary | ICD-10-CM | POA: Diagnosis not present

## 2022-06-08 DIAGNOSIS — H5203 Hypermetropia, bilateral: Secondary | ICD-10-CM | POA: Diagnosis not present

## 2022-06-09 DIAGNOSIS — R059 Cough, unspecified: Secondary | ICD-10-CM | POA: Diagnosis not present

## 2022-06-09 DIAGNOSIS — J449 Chronic obstructive pulmonary disease, unspecified: Secondary | ICD-10-CM | POA: Diagnosis not present

## 2022-06-09 DIAGNOSIS — L03115 Cellulitis of right lower limb: Secondary | ICD-10-CM | POA: Diagnosis not present

## 2022-06-19 DIAGNOSIS — L97911 Non-pressure chronic ulcer of unspecified part of right lower leg limited to breakdown of skin: Secondary | ICD-10-CM | POA: Diagnosis not present

## 2022-06-19 DIAGNOSIS — I872 Venous insufficiency (chronic) (peripheral): Secondary | ICD-10-CM | POA: Diagnosis not present

## 2022-08-25 IMAGING — CT CT CHEST LUNG CANCER SCREENING LOW DOSE W/O CM
2 of 4 series · 15 of 36 positions shown, 18 images · non-contrast
Comparison: Low-dose lung cancer screening chest CT 11/05/2020.

CLINICAL DATA: 80-year-old female current smoker with 63 pack-year
history of smoking. Lung cancer screening examination.

EXAM:
CT CHEST WITHOUT CONTRAST LOW-DOSE FOR LUNG CANCER SCREENING
TECHNIQUE: Multidetector CT imaging of the chest was performed following the
standard protocol without IV contrast.

[Series 3: lung thins 1.0 · axial · 0.72mm/px · z∈[-579,-276]mm · 12 of 335 slices shown, 15 images]
[im 16/335  mediastinal]
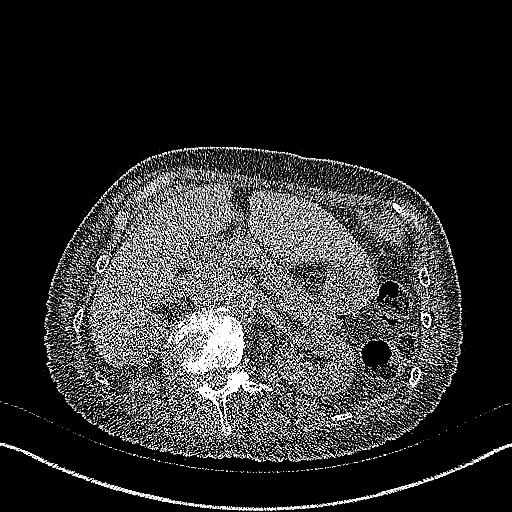
[im 16/335  lung]
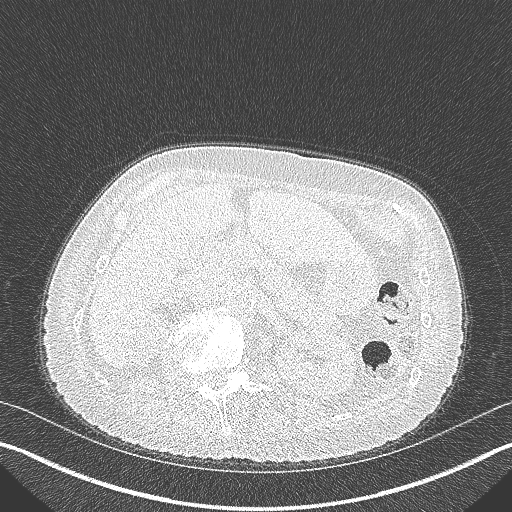
[im 46/335  lung]
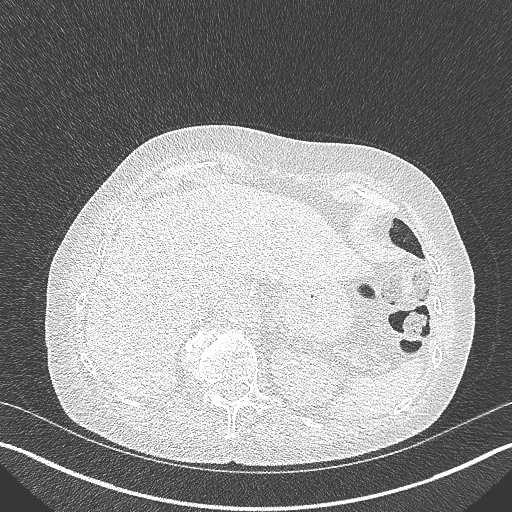
[im 76/335  lung]
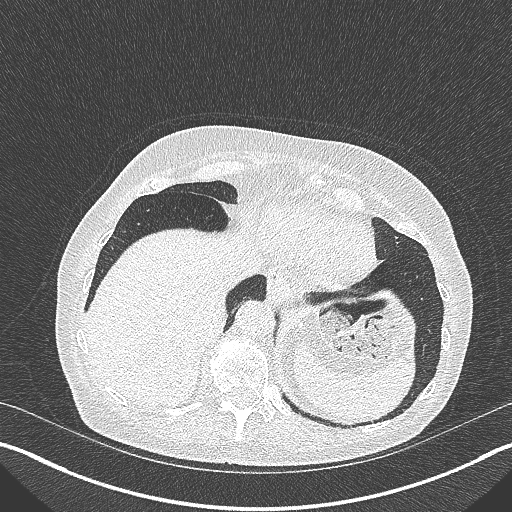
[im 107/335  lung]
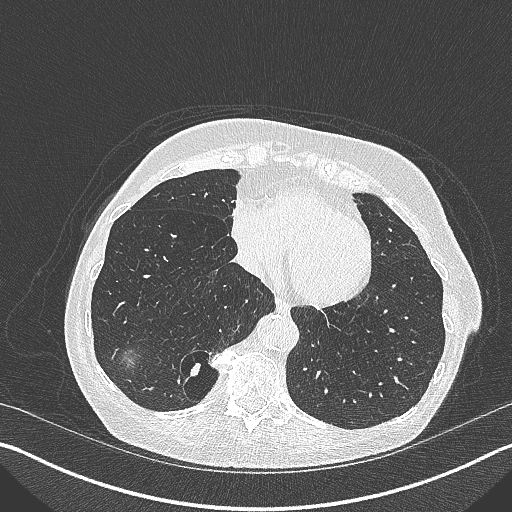
[im 122/335  mediastinal]
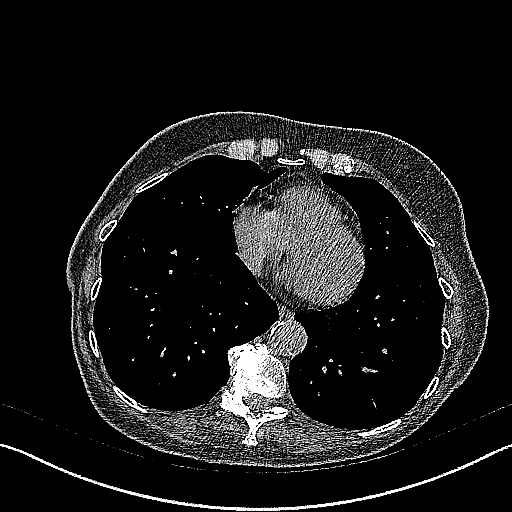
[im 122/335  lung]
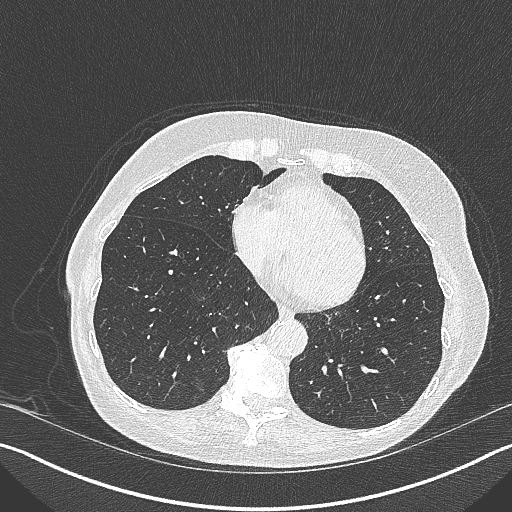
[im 152/335  lung]
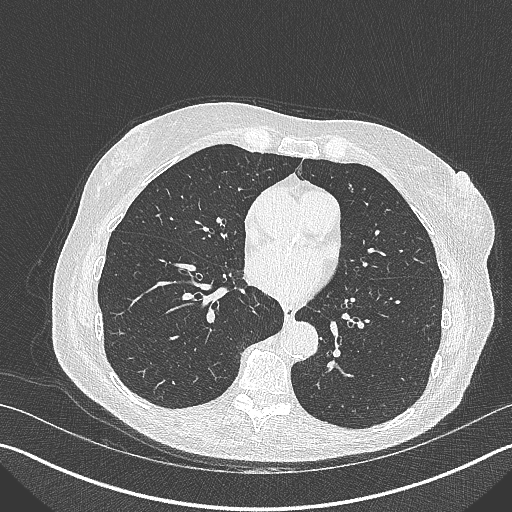
[im 183/335  lung]
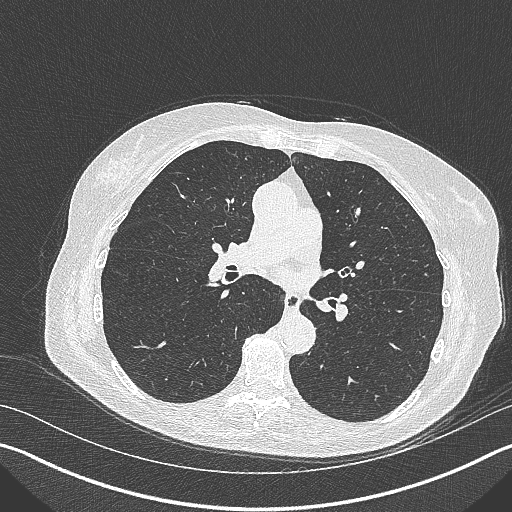
[im 213/335  lung]
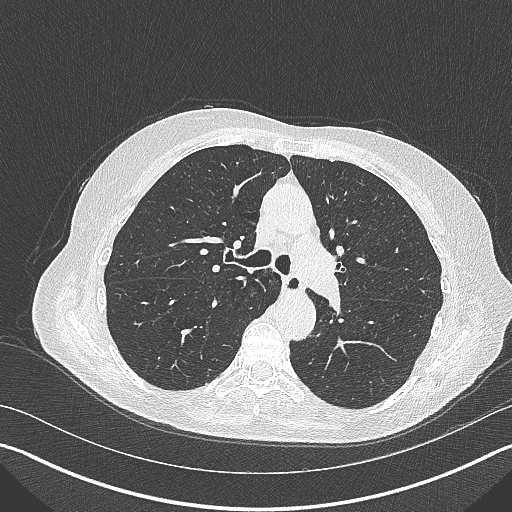
[im 228/335  mediastinal]
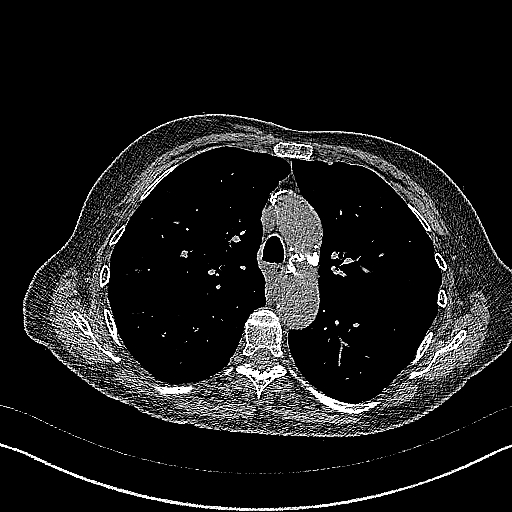
[im 228/335  lung]
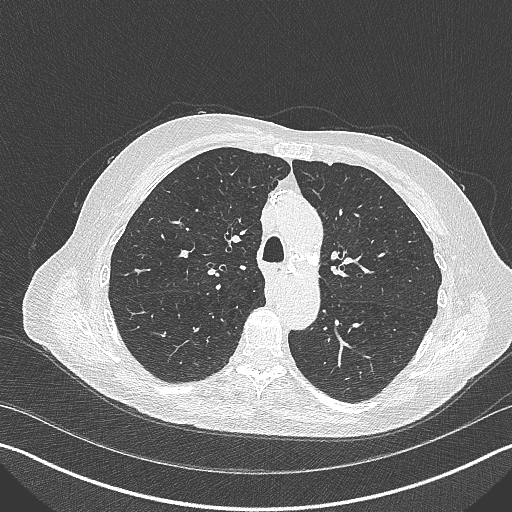
[im 259/335  lung]
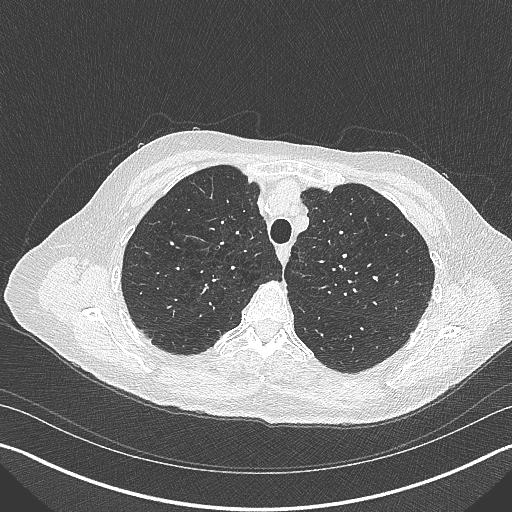
[im 289/335  lung]
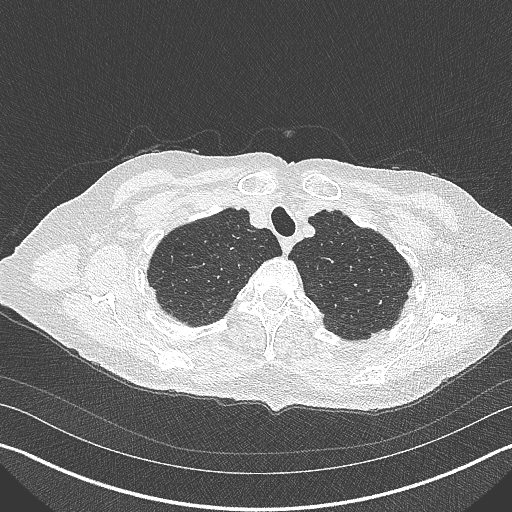
[im 319/335  lung]
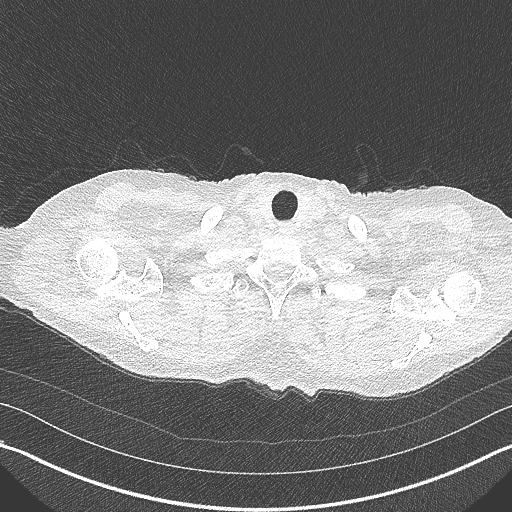

[Series 5: coronal · coronal · 0.59mm/px · 3 of 120 slices shown]
[im 24/120  lung]
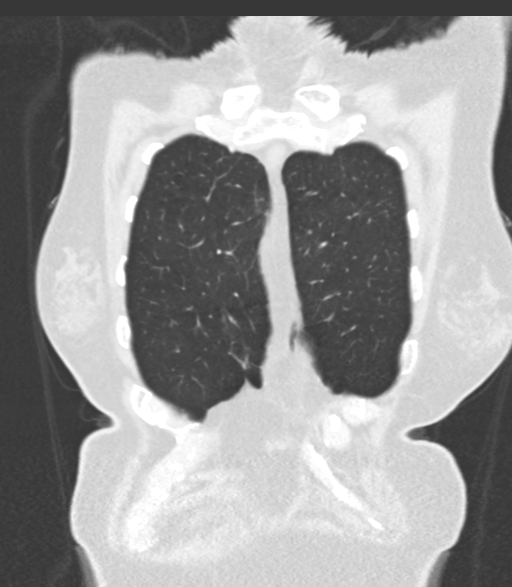
[im 48/120  lung]
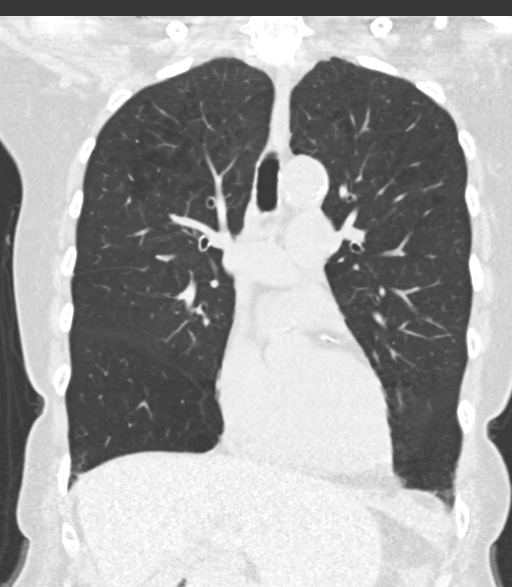
[im 72/120  lung]
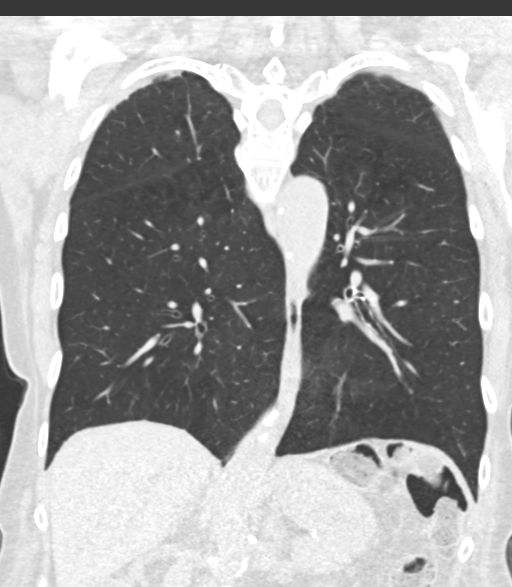

[15 of 36 positions shown; findings below may reference images not displayed]

FINDINGS: Cardiovascular: Heart size is normal. There is no significant
pericardial fluid, thickening or pericardial calcification. There is
aortic atherosclerosis, as well as atherosclerosis of the great
vessels of the mediastinum and the coronary arteries, including
calcified atherosclerotic plaque in the left anterior descending,
left circumflex and right coronary arteries. Mild calcifications of
the aortic valve.

Mediastinum/Nodes: No pathologically enlarged mediastinal or hilar
lymph nodes. Please note that accurate exclusion of hilar adenopathy
is limited on noncontrast CT scans. Esophagus is unremarkable in
appearance. No axillary lymphadenopathy.

Lungs/Pleura: Small pulmonary nodules appear similar in number and
size to the prior examination, largest of which is located within a
cystic region in the right lower lobe (axial image 102 of series 3),
stable compared to numerous prior examinations, currently with a
volume derived mean diameter of 8.9 mm. No larger more suspicious
appearing pulmonary nodules or masses are noted. No acute
consolidative airspace disease. No pleural effusions. Diffuse
bronchial wall thickening with mild centrilobular and paraseptal
emphysema.

Upper Abdomen: Aortic atherosclerosis.

Musculoskeletal: There are no aggressive appearing lytic or blastic
lesions noted in the visualized portions of the skeleton.
IMPRESSION: 1. Lung-RADS 2S, benign appearance or behavior. Continue annual
screening with low-dose chest CT without contrast in 12 months.
2. The "S" modifier above refers to potentially clinically
significant non lung cancer related findings. Specifically, there is
aortic atherosclerosis, in addition to three-vessel coronary artery
disease.
3. Mild diffuse bronchial wall thickening with mild centrilobular
and paraseptal emphysema; imaging findings suggestive of underlying
COPD.
4. There are calcifications of the aortic valve. Echocardiographic
correlation for evaluation of potential valvular dysfunction may be
warranted if clinically indicated.

Aortic Atherosclerosis (VJ7LU-BDJ.J) and Emphysema (VJ7LU-IXV.6).

## 2022-09-11 DIAGNOSIS — M5136 Other intervertebral disc degeneration, lumbar region: Secondary | ICD-10-CM | POA: Diagnosis not present

## 2022-09-11 DIAGNOSIS — M4317 Spondylolisthesis, lumbosacral region: Secondary | ICD-10-CM | POA: Diagnosis not present

## 2022-09-11 DIAGNOSIS — M4120 Other idiopathic scoliosis, site unspecified: Secondary | ICD-10-CM | POA: Diagnosis not present

## 2022-09-11 DIAGNOSIS — M5416 Radiculopathy, lumbar region: Secondary | ICD-10-CM | POA: Diagnosis not present

## 2022-09-26 ENCOUNTER — Encounter (HOSPITAL_BASED_OUTPATIENT_CLINIC_OR_DEPARTMENT_OTHER): Payer: Self-pay | Admitting: Physical Therapy

## 2022-09-26 ENCOUNTER — Ambulatory Visit (HOSPITAL_BASED_OUTPATIENT_CLINIC_OR_DEPARTMENT_OTHER): Payer: Medicare PPO | Attending: Neurosurgery | Admitting: Physical Therapy

## 2022-09-26 DIAGNOSIS — M6281 Muscle weakness (generalized): Secondary | ICD-10-CM | POA: Diagnosis not present

## 2022-09-26 DIAGNOSIS — R2689 Other abnormalities of gait and mobility: Secondary | ICD-10-CM | POA: Diagnosis not present

## 2022-09-26 DIAGNOSIS — M25552 Pain in left hip: Secondary | ICD-10-CM | POA: Diagnosis not present

## 2022-09-26 DIAGNOSIS — M5459 Other low back pain: Secondary | ICD-10-CM

## 2022-09-26 NOTE — Therapy (Signed)
OUTPATIENT PHYSICAL THERAPY THORACOLUMBAR EVALUATION   Patient Name: Karen Bonilla MRN: [id number] DOB:1941-02-08, 81 y.o., female Today's Date: 09/26/2022   PT End of Session - 09/26/22 1353     Visit Number 1    Number of Visits 13    Date for PT Re-Evaluation 11/07/22    Authorization Type [employer]    Authorization Time Period 09/26/22 to 11/07/22    PT Start Time 1302    PT Stop Time 1342    PT Time Calculation (min) 40 min    Activity Tolerance Patient tolerated treatment well    Behavior During Therapy [facility] for tasks assessed/performed             Past Medical History:  Diagnosis Date   Abnormal chest CT    Anemia    Anosmia    Aortic atherosclerosis (HCC)    Arthritis    Back pain of lumbar region with sciatica    Basal cell carcinoma 09/01/2002   right shin tx exc   BCC (basal cell carcinoma of skin) 06/20/2013   right forearm TX WITH BX   BCC (basal cell carcinoma of skin) 06/20/2013   right upper lip TX WITH BX   BCC (basal cell carcinoma of skin) 10/03/2016   mid upper back TX WITH BX   CAD (coronary artery disease)    Colon polyps    COPD (chronic obstructive pulmonary disease) ([place])    DDD (degenerative disc disease), lumbar    Diverticulosis large intestine w/o perforation or abscess w/o bleeding    Emphysema, unspecified (HCC)    History of colonic polyps    Hypercholesterolemia    Hyperlipidemia    Inguinal hernia    Mucoid cyst of joint    SCC (squamous cell carcinoma) 01/23/2011   left forearm scc/ka  TX CX3 5FU CAUTERY   SCC (squamous cell carcinoma) 10/03/2016   right shin scc/ka TX CX3 5FU CAUTERY   SCC (squamous cell carcinoma) 07/29/2019   left forehead scc TX CX3 5FU CAUTERY   Scoliosis of lumbar spine, unspecified scoliosis type    Seasonal allergies    Senile purpura ([place])    Spinal stenosis    Spinal stenosis    Squamous cell carcinoma of skin 10/23/2005   left lower shin inf. bowens  TX CX3 5FU CAUTERY   Squamous cell carcinoma  of skin 08/06/2020   in situ- right lower leg (CX35FU)   Squamous cell carcinoma of skin 08/06/2020   in situ- right lower leg- posterior lower (CX35FU)   Varicose veins of lower extremities with other complications 53/97/6734   Past Surgical History:  Procedure Laterality Date   ABDOMINAL HYSTERECTOMY     APPENDECTOMY     INGUINAL HERNIA REPAIR Bilateral 01/20/2022   Procedure: LAPAROSCOPIC BILATERAL INGUINAL HERNIA REPAIR WITH MESH;  Surgeon: Kinsinger, Arta Bruce, MD;  Location: [facility];  Service: General;  Laterality: Bilateral;   MASS EXCISION  01/11/2012   Procedure: MINOR EXCISION OF MASS;  Surgeon: Cammie Sickle., MD;  Location: [place];  Service: Orthopedics;  Laterality: Right;  excision mucoid cyst right index and dip joint debridement   Patient Active Problem List   Diagnosis Date Noted   Bilateral inguinal hernia 01/20/2022   Anosmia 02/02/2021   Degeneration of lumbar intervertebral disc 02/02/2021   Hardening of the aorta (main artery of the heart) ([place]) 02/02/2021   Hypercholesterolemia 02/02/2021   Other seasonal allergic rhinitis 02/02/2021   Other specified disorders of bone density and  structure, other site 02/02/2021   Personal history of colonic polyps 02/02/2021   Chronic obstructive pulmonary disease ([place]) 02/02/2021   Senile purpura ([place]) 02/02/2021   Spinal stenosis 02/02/2021   Abnormal findings on diagnostic imaging of other specified body structures 02/02/2021   Coronary artery calcification 03/19/2018   Ganglion of left wrist 03/07/2017   Varicose veins of lower extremities with other complications 16/09/9603    PCP: Yaakov Guthrie   REFERRING PROVIDER: Marvis Moeller, NP   REFERRING DIAG: M54.16 (ICD-10-CM) - Radiculopathy, lumbar region   Rationale for Evaluation and Treatment Rehabilitation  THERAPY DIAG:  Other low back pain - Plan: PT plan of care cert/re-cert  Pain in left hip - Plan: PT plan of care  cert/re-cert  Muscle weakness (generalized) - Plan: PT plan of care cert/re-cert  Other abnormalities of gait and mobility - Plan: PT plan of care cert/re-cert  ONSET DATE: 54/08/8118   SUBJECTIVE:                                                                                                                                                                                           SUBJECTIVE STATEMENT: I have collapsed discs, scoliosis, narrowing of my spinal cord, arthritis, bone spurs, maybe some other things in my images. My real problem is in my left leg, somehow everything has crept into my left leg pain wise. The pain makes it hard for me to sleep, have to lean on a cart to make it in the store. Having to stand or walking without cart makes my back hurt. I can't walk without bending over, I can stand up straight I automatically go forward some; if I have to stand I take a cane. I have a prescription for Tramadol but its been keeping me awake.   PERTINENT HISTORY:    PAIN:  Are you having pain? No no pain sitting down; when I stand it catches me across my L groin and something pulls down to my knee on the inside of my leg. Leg pain can get to 6-7/10 at worst, back pain can get to 3-4/10 at worst    PRECAUTIONS: None  WEIGHT BEARING RESTRICTIONS No  FALLS:  Has patient fallen in last 6 months? No  LIVING ENVIRONMENT: Lives with: lives alone Lives in: House/apartment Stairs: 6 STE B rails, no steps inside  Has following equipment at home: Single point cane  OCCUPATION: retired   PLOF: Independent, Independent with basic ADLs, Independent with gait, and Independent with transfers  PATIENT GOALS stop leg pain, "leg pain is my main concern, I don't think there will be anything that will stop  the back pain"    OBJECTIVE:   DIAGNOSTIC FINDINGS:    PATIENT SURVEYS:  FOTO 53.3  SCREENING FOR RED FLAGS: Bowel or bladder incontinence: No Spinal tumors: No Cauda equina  syndrome: No Compression fracture: No Abdominal aneurysm: No  COGNITION:  Overall cognitive status: Within functional limits for tasks assessed     SENSATION: Not tested  MUSCLE LENGTH: Quads severe limitation B, hip flexors severe limitation B, B hamstrings moderate limitation, B piriformis moderate limitation B  POSTURE: rounded shoulders, forward head, decreased lumbar lordosis, increased thoracic kyphosis, and flexed trunk   PALPATION: B lumbar paraspinals tight and TTP, L glutes and piriformis TTP and tight   LUMBAR ROM:   Active  A/PROM  eval  Flexion Mild limitation   Extension Severe limitation   Right lateral flexion Severe limitation   Left lateral flexion Severe limitation   Right rotation   Left rotation    (Blank rows = not tested)  L hip globally less mobile than the R PROM   LOWER EXTREMITY ROM:     Active  Right eval Left eval  Hip flexion    Hip extension    Hip abduction    Hip adduction    Hip internal rotation    Hip external rotation    Knee flexion    Knee extension    Ankle dorsiflexion    Ankle plantarflexion    Ankle inversion    Ankle eversion     (Blank rows = not tested)  LOWER EXTREMITY MMT:    MMT Right eval Left eval  Hip flexion 4 4  Hip extension 3- 3-  Hip abduction 3 4  Hip adduction    Hip internal rotation    Hip external rotation    Knee flexion 4 4  Knee extension 5 5  Ankle dorsiflexion 5 5  Ankle plantarflexion    Ankle inversion    Ankle eversion     (Blank rows = not tested)  LUMBAR SPECIAL TESTS:  RLE slightly longer than L did not attempt MET today   FUNCTIONAL TESTS:    GAIT: Distance walked: in clinic distances  Assistive device utilized: None Level of assistance: Complete Independence Comments: flexed at hips, Pelvis shifted to the R/shoulders to the L due to scoliosis, + trend     TODAY'S TREATMENT   Eval  TherEx:  Bridges x10  Lumbar rotation stretches 3x10 seconds  Piriformis  stretch 2x30 seconds B    PATIENT EDUCATION:  Education details: exam findings, POC, HEP  Person educated: Patient Education method: Explanation, Demonstration, and Handouts Education comprehension: verbalized understanding and returned demonstration   HOME EXERCISE PROGRAM: [id number]  ASSESSMENT:  CLINICAL IMPRESSION: Patient is a 81 y.o. F who was seen today for physical therapy evaluation and treatment for lumbar radiculopathy. Exam reveals significant postural deviation likely due to scoliosis, functional muscle weakness and flexibility impairment, gait deviation, and limited tolerance to functional task performance. Will benefit from skilled PT services to address impairments, reduce pain, and optimize overall level of function.    OBJECTIVE IMPAIRMENTS Abnormal gait, decreased mobility, difficulty walking, decreased ROM, decreased strength, hypomobility, increased fascial restrictions, increased muscle spasms, impaired flexibility, postural dysfunction, and pain.   ACTIVITY LIMITATIONS standing, sleeping, transfers, dressing, and locomotion level  PARTICIPATION LIMITATIONS: cleaning, laundry, shopping, community activity, and yard work  PERSONAL FACTORS Age, Behavior pattern, Past/current experiences, and Time since onset of injury/illness/exacerbation are also affecting patient's functional outcome.   REHAB POTENTIAL: Good  CLINICAL DECISION MAKING:  Stable/uncomplicated  EVALUATION COMPLEXITY: Low   GOALS: Goals reviewed with patient? Yes  SHORT TERM GOALS: Target date: 10/17/2022  Will be compliant with appropriate progressive HEP  Baseline: Goal status: INITIAL  2.  Will demonstrate at least a 50% improvement in piriformis and HS flexibility B  Baseline:  Goal status: INITIAL  3.  LE pain to be no more than 4/10 at worst when standing for 60 minutes to cook  Baseline:  Goal status: INITIAL  4.  Will have better understanding of posture and general ergonomics   Baseline:  Goal status: INITIAL   LONG TERM GOALS: Target date: 11/07/2022  MMT to improve by at least 1 grade in all weak groups  Baseline:  Goal status: INITIAL  2.  Low back and LE pain to be no more than 2/10 with extended standing for greater than 90 minutes  Baseline:  Goal status: INITIAL  3.  Will be able to sleep for at least 4 hours without waking due to pain  Baseline:  Goal status: INITIAL  4.  Will be compliant with appropriate gym vs advanced HEP to maintain functional gains and prevent recurrence of pain  Baseline:  Goal status: INITIAL    PLAN: PT FREQUENCY: 2x/week  PT DURATION: 6 weeks  PLANNED INTERVENTIONS: Therapeutic exercises, Therapeutic activity, Neuromuscular re-education, Balance training, Gait training, Patient/Family education, Self Care, Aquatic Therapy, Manual therapy, and Re-evaluation.  PLAN FOR NEXT SESSION: core and hip strengthening, flexibility, hip ROM. Consider trying lumbar shifts to L to see if this helps pain, also consider shoe lift    Deloise Marchant U PT DPT PN2  09/26/2022, 1:57 PM   Referring diagnosis? M54.16  Treatment diagnosis? (if different than referring diagnosis) M54.59, M25.552, M62.81, R26.89 What was this (referring dx) caused by? []  Surgery []  Fall [x]  Ongoing issue [x]  Arthritis []  Other: ____________  Laterality: []  Rt [x]  Lt []  Both  Check all possible CPT codes:  *CHOOSE 10 OR LESS*    []  97110 (Therapeutic Exercise)  []  92507 (SLP Treatment)  []  97112 (Neuro Re-ed)   []  92526 (Swallowing Treatment)   []  97116 (Gait Training)   []  [id number] (Cognitive Training, 1st 15 minutes) []  97140 (Manual Therapy)   []  97130 (Cognitive Training, each add'l 15 minutes)  []  97164 (Re-evaluation)                              []  Other, List CPT Code ____________  []  11552 (Therapeutic Activities)     []  97535 (Self Care)   [x]  All codes above (97110 - 97535)  []  97012 (Mechanical Traction)  []  97014 (E-stim  Unattended)  []  97032 (E-stim manual)  []  97033 (Ionto)  []  97035 (Ultrasound) []  97750 (Physical Performance Training) [x]  [id number] (Aquatic Therapy) []  97016 (Vasopneumatic Device) []  [id number] (Paraffin) []  97034 (Contrast Bath) []  97597 (Wound Care 1st 20 sq cm) []  97598 (Wound Care each add'l 20 sq cm) []  97760 (Orthotic Fabrication, Fitting, Training Initial) []  [id number] (Prosthetic Management and Training Initial) []  [id number] (Orthotic or Prosthetic Training/ Modification Subsequent)

## 2022-10-02 ENCOUNTER — Ambulatory Visit (HOSPITAL_BASED_OUTPATIENT_CLINIC_OR_DEPARTMENT_OTHER): Payer: Medicare PPO | Admitting: Physical Therapy

## 2022-10-02 DIAGNOSIS — R2689 Other abnormalities of gait and mobility: Secondary | ICD-10-CM | POA: Diagnosis not present

## 2022-10-02 DIAGNOSIS — M6281 Muscle weakness (generalized): Secondary | ICD-10-CM | POA: Diagnosis not present

## 2022-10-02 DIAGNOSIS — M25552 Pain in left hip: Secondary | ICD-10-CM | POA: Diagnosis not present

## 2022-10-02 DIAGNOSIS — M5459 Other low back pain: Secondary | ICD-10-CM | POA: Diagnosis not present

## 2022-10-02 NOTE — Therapy (Signed)
OUTPATIENT PHYSICAL THERAPY THORACOLUMBAR EVALUATION   Patient Name: Karen Bonilla MRN: [id number] DOB:[birth year], 81 y.o., female Today's Date: 10/02/2022     Past Medical History:  Diagnosis Date   Abnormal chest CT    Anemia    Anosmia    Aortic atherosclerosis (HCC)    Arthritis    Back pain of lumbar region with sciatica    Basal cell carcinoma 09/01/2002   right shin tx exc   BCC (basal cell carcinoma of skin) 06/20/2013   right forearm TX WITH BX   BCC (basal cell carcinoma of skin) 06/20/2013   right upper lip TX WITH BX   BCC (basal cell carcinoma of skin) 10/03/2016   mid upper back TX WITH BX   CAD (coronary artery disease)    Colon polyps    COPD (chronic obstructive pulmonary disease) (HCC)    DDD (degenerative disc disease), lumbar    Diverticulosis large intestine w/o perforation or abscess w/o bleeding    Emphysema, unspecified (HCC)    History of colonic polyps    Hypercholesterolemia    Hyperlipidemia    Inguinal hernia    Mucoid cyst of joint    SCC (squamous cell carcinoma) 01/23/2011   left forearm scc/ka  TX CX3 5FU CAUTERY   SCC (squamous cell carcinoma) 10/03/2016   right shin scc/ka TX CX3 5FU CAUTERY   SCC (squamous cell carcinoma) 07/29/2019   left forehead scc TX CX3 5FU CAUTERY   Scoliosis of lumbar spine, unspecified scoliosis type    Seasonal allergies    Senile purpura ([place])    Spinal stenosis    Spinal stenosis    Squamous cell carcinoma of skin 10/23/2005   left lower shin inf. bowens  TX CX3 5FU CAUTERY   Squamous cell carcinoma of skin 08/06/2020   in situ- right lower leg (CX35FU)   Squamous cell carcinoma of skin 08/06/2020   in situ- right lower leg- posterior lower (CX35FU)   Varicose veins of lower extremities with other complications 43/32/9518   Past Surgical History:  Procedure Laterality Date   ABDOMINAL HYSTERECTOMY     APPENDECTOMY     INGUINAL HERNIA REPAIR Bilateral 01/20/2022   Procedure: LAPAROSCOPIC  BILATERAL INGUINAL HERNIA REPAIR WITH MESH;  Surgeon: Kinsinger, Arta Bruce, MD;  Location: [facility];  Service: General;  Laterality: Bilateral;   MASS EXCISION  01/11/2012   Procedure: MINOR EXCISION OF MASS;  Surgeon: Cammie Sickle., MD;  Location: [place];  Service: Orthopedics;  Laterality: Right;  excision mucoid cyst right index and dip joint debridement   Patient Active Problem List   Diagnosis Date Noted   Bilateral inguinal hernia 01/20/2022   Anosmia 02/02/2021   Degeneration of lumbar intervertebral disc 02/02/2021   Hardening of the aorta (main artery of the heart) ([place]) 02/02/2021   Hypercholesterolemia 02/02/2021   Other seasonal allergic rhinitis 02/02/2021   Other specified disorders of bone density and structure, other site 02/02/2021   Personal history of colonic polyps 02/02/2021   Chronic obstructive pulmonary disease ([place]) 02/02/2021   Senile purpura ([place]) 02/02/2021   Spinal stenosis 02/02/2021   Abnormal findings on diagnostic imaging of other specified body structures 02/02/2021   Coronary artery calcification 03/19/2018   Ganglion of left wrist 03/07/2017   Varicose veins of lower extremities with other complications 84/16/6063    PCP: Yaakov Guthrie   REFERRING PROVIDER: Marvis Moeller, NP   REFERRING DIAG: M54.16 (ICD-10-CM) - Radiculopathy, lumbar region   Rationale for  Evaluation and Treatment Rehabilitation  THERAPY DIAG:  No diagnosis found.  ONSET DATE: 09/11/2022   SUBJECTIVE:                                                                                                                                                                                           SUBJECTIVE STATEMENT: I have collapsed discs, scoliosis, narrowing of my spinal cord, arthritis, bone spurs, maybe some other things in my images. My real problem is in my left leg, somehow everything has crept into my left leg pain wise. The pain makes it hard for  me to sleep, have to lean on a cart to make it in the store. Having to stand or walking without cart makes my back hurt. I can't walk without bending over, I can stand up straight I automatically go forward some; if I have to stand I take a cane. I have a prescription for Tramadol but its been keeping me awake.   PERTINENT HISTORY:    PAIN:  Are you having pain? No no pain sitting down; when I stand it catches me across my L groin and something pulls down to my knee on the inside of my leg. Leg pain can get to 6-7/10 at worst, back pain can get to 3-4/10 at worst    PRECAUTIONS: None  WEIGHT BEARING RESTRICTIONS No  FALLS:  Has patient fallen in last 6 months? No  LIVING ENVIRONMENT: Lives with: lives alone Lives in: House/apartment Stairs: 6 STE B rails, no steps inside  Has following equipment at home: Single point cane  OCCUPATION: retired   PLOF: Independent, Independent with basic ADLs, Independent with gait, and Independent with transfers  PATIENT GOALS stop leg pain, "leg pain is my main concern, I don't think there will be anything that will stop the back pain"    OBJECTIVE:   DIAGNOSTIC FINDINGS:    PATIENT SURVEYS:  FOTO 53.3  SCREENING FOR RED FLAGS: Bowel or bladder incontinence: No Spinal tumors: No Cauda equina syndrome: No Compression fracture: No Abdominal aneurysm: No  COGNITION:  Overall cognitive status: Within functional limits for tasks assessed     SENSATION: Not tested  MUSCLE LENGTH: Quads severe limitation B, hip flexors severe limitation B, B hamstrings moderate limitation, B piriformis moderate limitation B  POSTURE: rounded shoulders, forward head, decreased lumbar lordosis, increased thoracic kyphosis, and flexed trunk   PALPATION: B lumbar paraspinals tight and TTP, L glutes and piriformis TTP and tight   LUMBAR ROM:   Active  A/PROM  eval  Flexion Mild limitation   Extension Severe limitation   Right lateral flexion Severe  limitation  Left lateral flexion Severe limitation   Right rotation   Left rotation    (Blank rows = not tested)  L hip globally less mobile than the R PROM   LOWER EXTREMITY ROM:     Active  Right eval Left eval  Hip flexion    Hip extension    Hip abduction    Hip adduction    Hip internal rotation    Hip external rotation    Knee flexion    Knee extension    Ankle dorsiflexion    Ankle plantarflexion    Ankle inversion    Ankle eversion     (Blank rows = not tested)  LOWER EXTREMITY MMT:    MMT Right eval Left eval  Hip flexion 4 4  Hip extension 3- 3-  Hip abduction 3 4  Hip adduction    Hip internal rotation    Hip external rotation    Knee flexion 4 4  Knee extension 5 5  Ankle dorsiflexion 5 5  Ankle plantarflexion    Ankle inversion    Ankle eversion     (Blank rows = not tested)  LUMBAR SPECIAL TESTS:  RLE slightly longer than L did not attempt MET today   FUNCTIONAL TESTS:    GAIT: Distance walked: in clinic distances  Assistive device utilized: None Level of assistance: Complete Independence Comments: flexed at hips, Pelvis shifted to the R/shoulders to the L due to scoliosis, + trend     TODAY'S TREATMENT  10/9  Eval  TherEx:  Bridges x10  Lumbar rotation stretches 3x10 seconds  Piriformis stretch 2x30 seconds B    PATIENT EDUCATION:  Education details: exam findings, POC, HEP  Person educated: Patient Education method: Explanation, Demonstration, and Handouts Education comprehension: verbalized understanding and returned demonstration   HOME EXERCISE PROGRAM: [id number]  ASSESSMENT:  CLINICAL IMPRESSION: Patient is a 81 y.o. F who was seen today for physical therapy evaluation and treatment for lumbar radiculopathy. Exam reveals significant postural deviation likely due to scoliosis, functional muscle weakness and flexibility impairment, gait deviation, and limited tolerance to functional task performance. Will benefit  from skilled PT services to address impairments, reduce pain, and optimize overall level of function.    OBJECTIVE IMPAIRMENTS Abnormal gait, decreased mobility, difficulty walking, decreased ROM, decreased strength, hypomobility, increased fascial restrictions, increased muscle spasms, impaired flexibility, postural dysfunction, and pain.   ACTIVITY LIMITATIONS standing, sleeping, transfers, dressing, and locomotion level  PARTICIPATION LIMITATIONS: cleaning, laundry, shopping, community activity, and yard work  PERSONAL FACTORS Age, Behavior pattern, Past/current experiences, and Time since onset of injury/illness/exacerbation are also affecting patient's functional outcome.   REHAB POTENTIAL: Good  CLINICAL DECISION MAKING: Stable/uncomplicated  EVALUATION COMPLEXITY: Low   GOALS: Goals reviewed with patient? Yes  SHORT TERM GOALS: Target date: 10/17/2022  Will be compliant with appropriate progressive HEP  Baseline: Goal status: INITIAL  2.  Will demonstrate at least a 50% improvement in piriformis and HS flexibility B  Baseline:  Goal status: INITIAL  3.  LE pain to be no more than 4/10 at worst when standing for 60 minutes to cook  Baseline:  Goal status: INITIAL  4.  Will have better understanding of posture and general ergonomics  Baseline:  Goal status: INITIAL   LONG TERM GOALS: Target date: 11/07/2022  MMT to improve by at least 1 grade in all weak groups  Baseline:  Goal status: INITIAL  2.  Low back and LE pain to be no more than 2/10 with extended  standing for greater than 90 minutes  Baseline:  Goal status: INITIAL  3.  Will be able to sleep for at least 4 hours without waking due to pain  Baseline:  Goal status: INITIAL  4.  Will be compliant with appropriate gym vs advanced HEP to maintain functional gains and prevent recurrence of pain  Baseline:  Goal status: INITIAL    PLAN: PT FREQUENCY: 2x/week  PT DURATION: 6 weeks  PLANNED  INTERVENTIONS: Therapeutic exercises, Therapeutic activity, Neuromuscular re-education, Balance training, Gait training, Patient/Family education, Self Care, Aquatic Therapy, Manual therapy, and Re-evaluation.  PLAN FOR NEXT SESSION: core and hip strengthening, flexibility, hip ROM. Consider trying lumbar shifts to L to see if this helps pain, also consider shoe lift    Kristen U PT DPT PN2  10/02/2022, 12:34 PM   Referring diagnosis? M54.16  Treatment diagnosis? (if different than referring diagnosis) M54.59, M25.552, M62.81, R26.89 What was this (referring dx) caused by? []  Surgery []  Fall [x]  Ongoing issue [x]  Arthritis []  Other: ____________  Laterality: []  Rt [x]  Lt []  Both  Check all possible CPT codes:  *CHOOSE 10 OR LESS*    []  97110 (Therapeutic Exercise)  []  92507 (SLP Treatment)  []  97112 (Neuro Re-ed)   []  92526 (Swallowing Treatment)   []  97116 (Gait Training)   []  [id number] (Cognitive Training, 1st 15 minutes) []  49702 (Manual Therapy)   []  97130 (Cognitive Training, each add'l 15 minutes)  []  63785 (Re-evaluation)                              []  Other, List CPT Code ____________  []  88502 (Therapeutic Activities)     []  97535 (Self Care)   [x]  All codes above (97110 - 97535)  []  97012 (Mechanical Traction)  []  97014 (E-stim Unattended)  []  97032 (E-stim manual)  []  97033 (Ionto)  []  97035 (Ultrasound) []  77412 (Physical Performance Training) [x]  [id number] (Aquatic Therapy) []  97016 (Vasopneumatic Device) []  [id number] (Paraffin) []  97034 (Contrast Bath) []  97597 (Wound Care 1st 20 sq cm) []  97598 (Wound Care each add'l 20 sq cm) []  97760 (Orthotic Fabrication, Fitting, Training Initial) []  [id number] (Prosthetic Management and Training Initial) []  [id number] (Orthotic or Prosthetic Training/ Modification Subsequent)

## 2022-10-03 ENCOUNTER — Encounter (HOSPITAL_BASED_OUTPATIENT_CLINIC_OR_DEPARTMENT_OTHER): Payer: Self-pay | Admitting: Physical Therapy

## 2022-10-05 ENCOUNTER — Encounter (HOSPITAL_BASED_OUTPATIENT_CLINIC_OR_DEPARTMENT_OTHER): Payer: Self-pay | Admitting: Physical Therapy

## 2022-10-05 ENCOUNTER — Ambulatory Visit (HOSPITAL_BASED_OUTPATIENT_CLINIC_OR_DEPARTMENT_OTHER): Payer: Medicare PPO | Admitting: Physical Therapy

## 2022-10-05 DIAGNOSIS — M5459 Other low back pain: Secondary | ICD-10-CM

## 2022-10-05 DIAGNOSIS — M6281 Muscle weakness (generalized): Secondary | ICD-10-CM

## 2022-10-05 DIAGNOSIS — M25552 Pain in left hip: Secondary | ICD-10-CM | POA: Diagnosis not present

## 2022-10-05 DIAGNOSIS — R2689 Other abnormalities of gait and mobility: Secondary | ICD-10-CM

## 2022-10-05 NOTE — Therapy (Signed)
OUTPATIENT PHYSICAL THERAPY THORACOLUMBAR EVALUATION   Patient Name: Karen Bonilla MRN: [id number] DOB:1941-08-09, 81 y.o., female Today's Date: 10/05/2022   PT End of Session - 10/05/22 1625     Visit Number 3    Number of Visits 13    Date for PT Re-Evaluation 11/07/22    Authorization Type [employer]    Authorization Time Period 09/26/22 to 11/07/22    PT Start Time 1430    PT Stop Time 1512    PT Time Calculation (min) 42 min    Activity Tolerance Patient tolerated treatment well    Behavior During Therapy [facility] for tasks assessed/performed               Past Medical History:  Diagnosis Date   Abnormal chest CT    Anemia    Anosmia    Aortic atherosclerosis (HCC)    Arthritis    Back pain of lumbar region with sciatica    Basal cell carcinoma 09/01/2002   right shin tx exc   BCC (basal cell carcinoma of skin) 06/20/2013   right forearm TX WITH BX   BCC (basal cell carcinoma of skin) 06/20/2013   right upper lip TX WITH BX   BCC (basal cell carcinoma of skin) 10/03/2016   mid upper back TX WITH BX   CAD (coronary artery disease)    Colon polyps    COPD (chronic obstructive pulmonary disease) (HCC)    DDD (degenerative disc disease), lumbar    Diverticulosis large intestine w/o perforation or abscess w/o bleeding    Emphysema, unspecified (HCC)    History of colonic polyps    Hypercholesterolemia    Hyperlipidemia    Inguinal hernia    Mucoid cyst of joint    SCC (squamous cell carcinoma) 01/23/2011   left forearm scc/ka  TX CX3 5FU CAUTERY   SCC (squamous cell carcinoma) 10/03/2016   right shin scc/ka TX CX3 5FU CAUTERY   SCC (squamous cell carcinoma) 07/29/2019   left forehead scc TX CX3 5FU CAUTERY   Scoliosis of lumbar spine, unspecified scoliosis type    Seasonal allergies    Senile purpura ([place])    Spinal stenosis    Spinal stenosis    Squamous cell carcinoma of skin 10/23/2005   left lower shin inf. bowens  TX CX3 5FU CAUTERY   Squamous cell  carcinoma of skin 08/06/2020   in situ- right lower leg (CX35FU)   Squamous cell carcinoma of skin 08/06/2020   in situ- right lower leg- posterior lower (CX35FU)   Varicose veins of lower extremities with other complications 15/72/6203   Past Surgical History:  Procedure Laterality Date   ABDOMINAL HYSTERECTOMY     APPENDECTOMY     INGUINAL HERNIA REPAIR Bilateral 01/20/2022   Procedure: LAPAROSCOPIC BILATERAL INGUINAL HERNIA REPAIR WITH MESH;  Surgeon: Kinsinger, Arta Bruce, MD;  Location: [facility];  Service: General;  Laterality: Bilateral;   MASS EXCISION  01/11/2012   Procedure: MINOR EXCISION OF MASS;  Surgeon: Cammie Sickle., MD;  Location: [facility];  Service: Orthopedics;  Laterality: Right;  excision mucoid cyst right index and dip joint debridement   Patient Active Problem List   Diagnosis Date Noted   Bilateral inguinal hernia 01/20/2022   Anosmia 02/02/2021   Degeneration of lumbar intervertebral disc 02/02/2021   Hardening of the aorta (main artery of the heart) ([place]) 02/02/2021   Hypercholesterolemia 02/02/2021   Other seasonal allergic rhinitis 02/02/2021   Other specified disorders of bone  density and structure, other site 02/02/2021   Personal history of colonic polyps 02/02/2021   Chronic obstructive pulmonary disease ([place]) 02/02/2021   Senile purpura ([place]) 02/02/2021   Spinal stenosis 02/02/2021   Abnormal findings on diagnostic imaging of other specified body structures 02/02/2021   Coronary artery calcification 03/19/2018   Ganglion of left wrist 03/07/2017   Varicose veins of lower extremities with other complications 98/92/1194    PCP: Yaakov Guthrie   REFERRING PROVIDER: Marvis Moeller, NP   REFERRING DIAG: M54.16 (ICD-10-CM) - Radiculopathy, lumbar region   Rationale for Evaluation and Treatment Rehabilitation  THERAPY DIAG:  Other low back pain  Pain in left hip  Muscle weakness (generalized)  Other abnormalities of gait  and mobility  ONSET DATE: 09/11/2022   SUBJECTIVE:                                                                                                                                                                                           SUBJECTIVE STATEMENT: The patient has been sore the past few days. She feels like it is more in the muscles then the joints. The pain gets better as she gets up and walks a little.   PERTINENT HISTORY:    PAIN:  Are you having pain? No no pain sitting down; when I stand it catches me across my L groin and something pulls down to my knee on the inside of my leg. Leg pain can get to 2-3/10 at worst when she first gets up , back pain can get to 3-4/10 at worst    PRECAUTIONS: None  WEIGHT BEARING RESTRICTIONS No  FALLS:  Has patient fallen in last 6 months? No  LIVING ENVIRONMENT: Lives with: lives alone Lives in: House/apartment Stairs: 6 STE B rails, no steps inside  Has following equipment at home: Single point cane  OCCUPATION: retired   PLOF: Independent, Independent with basic ADLs, Independent with gait, and Independent with transfers  PATIENT GOALS stop leg pain, "leg pain is my main concern, I don't think there will be anything that will stop the back pain"    OBJECTIVE:   DIAGNOSTIC FINDINGS:    PATIENT SURVEYS:  FOTO 53.3  SCREENING FOR RED FLAGS: Bowel or bladder incontinence: No Spinal tumors: No Cauda equina syndrome: No Compression fracture: No Abdominal aneurysm: No  COGNITION:  Overall cognitive status: Within functional limits for tasks assessed     SENSATION: Not tested  MUSCLE LENGTH: Quads severe limitation B, hip flexors severe limitation B, B hamstrings moderate limitation, B piriformis moderate limitation B  POSTURE: rounded shoulders, forward head, decreased lumbar lordosis, increased thoracic kyphosis,  and flexed trunk   PALPATION: B lumbar paraspinals tight and TTP, L glutes and piriformis TTP and  tight   LUMBAR ROM:   Active  A/PROM  eval  Flexion Mild limitation   Extension Severe limitation   Right lateral flexion Severe limitation   Left lateral flexion Severe limitation   Right rotation   Left rotation    (Blank rows = not tested)  L hip globally less mobile than the R PROM   LOWER EXTREMITY ROM:     Active  Right eval Left eval  Hip flexion    Hip extension    Hip abduction    Hip adduction    Hip internal rotation    Hip external rotation    Knee flexion    Knee extension    Ankle dorsiflexion    Ankle plantarflexion    Ankle inversion    Ankle eversion     (Blank rows = not tested)  LOWER EXTREMITY MMT:    MMT Right eval Left eval  Hip flexion 4 4  Hip extension 3- 3-  Hip abduction 3 4  Hip adduction    Hip internal rotation    Hip external rotation    Knee flexion 4 4  Knee extension 5 5  Ankle dorsiflexion 5 5  Ankle plantarflexion    Ankle inversion    Ankle eversion     (Blank rows = not tested)  LUMBAR SPECIAL TESTS:  RLE slightly longer than L did not attempt MET today   FUNCTIONAL TESTS:    GAIT: Distance walked: in clinic distances  Assistive device utilized: None Level of assistance: Complete Independence Comments: flexed at hips, Pelvis shifted to the R/shoulders to the L due to scoliosis, + trend     TODAY'S TREATMENT  10/12 Supine march x20  Supine hip abdcution red 2x15  Supine bridge 2x10   Hamstring stretch 3x20 sec hold  Ball roll out 3x10   Row 3x10 red   Reviewed all exercises with her HEP   Nu-step 5 min   10/9 LAD to left hip to improve flexion. Trigger point release to gluteal; reviewed self trigger point release to IT band   Gluteal stretch 3x20 sec hold  LTR x20   Supine march x20  Supine hip abdcution red 2x15  Supine bridge 2x10   Tennis ball release to gluteal.   Reviewed HEP and how to use it.   Eval  TherEx:  Bridges x10  Lumbar rotation stretches 3x10 seconds  Piriformis  stretch 2x30 seconds B    PATIENT EDUCATION:  Education details: exam findings, POC, HEP  Person educated: Patient Education method: Explanation, Demonstration, and Handouts Education comprehension: verbalized understanding and returned demonstration   HOME EXERCISE PROGRAM: [id number]  ASSESSMENT:  CLINICAL IMPRESSION: The patient had increased soreness today but it seems to be more DOMS. It is in both legs int he glutes and the quads. It gets better with activity. She had no increase in pain today. We reviewed HEP. We added in rowing for postural exercises. We also reviewed some different stretching techniques. She was advised her DOMS if that's what it is should resolve soon. We will continue to advance as tolerated. We will expand her standing exercises next land visit and review her aquatic exercises in the water.  OBJECTIVE IMPAIRMENTS Abnormal gait, decreased mobility, difficulty walking, decreased ROM, decreased strength, hypomobility, increased fascial restrictions, increased muscle spasms, impaired flexibility, postural dysfunction, and pain.   ACTIVITY LIMITATIONS standing, sleeping, transfers, dressing,  and locomotion level  PARTICIPATION LIMITATIONS: cleaning, laundry, shopping, community activity, and yard work  PERSONAL FACTORS Age, Behavior pattern, Past/current experiences, and Time since onset of injury/illness/exacerbation are also affecting patient's functional outcome.   REHAB POTENTIAL: Good  CLINICAL DECISION MAKING: Stable/uncomplicated  EVALUATION COMPLEXITY: Low   GOALS: Goals reviewed with patient? Yes  SHORT TERM GOALS: Target date: 10/17/2022  Will be compliant with appropriate progressive HEP  Baseline: Goal status: INITIAL  2.  Will demonstrate at least a 50% improvement in piriformis and HS flexibility B  Baseline:  Goal status: INITIAL  3.  LE pain to be no more than 4/10 at worst when standing for 60 minutes to cook  Baseline:  Goal  status: INITIAL  4.  Will have better understanding of posture and general ergonomics  Baseline:  Goal status: INITIAL   LONG TERM GOALS: Target date: 11/07/2022  MMT to improve by at least 1 grade in all weak groups  Baseline:  Goal status: INITIAL  2.  Low back and LE pain to be no more than 2/10 with extended standing for greater than 90 minutes  Baseline:  Goal status: INITIAL  3.  Will be able to sleep for at least 4 hours without waking due to pain  Baseline:  Goal status: INITIAL  4.  Will be compliant with appropriate gym vs advanced HEP to maintain functional gains and prevent recurrence of pain  Baseline:  Goal status: INITIAL    PLAN: PT FREQUENCY: 2x/week  PT DURATION: 6 weeks  PLANNED INTERVENTIONS: Therapeutic exercises, Therapeutic activity, Neuromuscular re-education, Balance training, Gait training, Patient/Family education, Self Care, Aquatic Therapy, Manual therapy, and Re-evaluation.  PLAN FOR NEXT SESSION: core and hip strengthening, flexibility, hip ROM. Consider trying lumbar shifts to L to see if this helps pain, also consider shoe lift    Carolyne Littles PT DPT  10/05/2022, 4:33 PM   Referring diagnosis? M54.16  Treatment diagnosis? (if different than referring diagnosis) M54.59, M25.552, M62.81, R26.89 What was this (referring dx) caused by? _0  Surgery _1  Fall _2  Ongoing issue _3  Arthritis _4  Other: ____________  Laterality: _5  Rt _6  Lt _7  Both  Check all possible CPT codes:  *CHOOSE 10 OR LESS*    _8  97110 (Therapeutic Exercise)  _9  92507 (SLP Treatment)  _10  16109 (Neuro Re-ed)   _11  92526 (Swallowing Treatment)   _12  60454 (Gait Training)   _13  09811 (Cognitive Training, 1st 15 minutes) _14  97140 (Manual Therapy)   _15  97130 (Cognitive Training, each add'l 15 minutes)  _16  97164 (Re-evaluation)                              _17  Other, List CPT Code ____________  _18  91478 (Therapeutic Activities)     _19  29562 (Self Care)   _20  All  codes above (97110 - 97535)  _21  97012 (Mechanical Traction)  _22  97014 (E-stim Unattended)  _23  97032 (E-stim manual)  _24  97033 (Ionto)  _25  97035 (Ultrasound) _26  97750 (Physical Performance Training) _27  13086 (Aquatic Therapy) _28  97016 (Vasopneumatic Device) _29  57846 (Paraffin) _30  96295 (Contrast Bath) _31  97597 (Wound Care 1st 20 sq cm) _32  97598 (Wound Care each add'l 20 sq cm) _33  97760 (Orthotic Fabrication, Fitting, Training Initial) _34  [id number] (Prosthetic Management and Training Initial) _35  [id number] (Orthotic or Prosthetic Training/ Modification Subsequent)

## 2022-10-09 ENCOUNTER — Ambulatory Visit (HOSPITAL_BASED_OUTPATIENT_CLINIC_OR_DEPARTMENT_OTHER): Payer: Medicare PPO | Admitting: Physical Therapy

## 2022-10-09 DIAGNOSIS — R2689 Other abnormalities of gait and mobility: Secondary | ICD-10-CM

## 2022-10-09 DIAGNOSIS — M6281 Muscle weakness (generalized): Secondary | ICD-10-CM

## 2022-10-09 DIAGNOSIS — M25552 Pain in left hip: Secondary | ICD-10-CM | POA: Diagnosis not present

## 2022-10-09 DIAGNOSIS — M5459 Other low back pain: Secondary | ICD-10-CM | POA: Diagnosis not present

## 2022-10-09 NOTE — Therapy (Signed)
OUTPATIENT PHYSICAL THERAPY THORACOLUMBAR EVALUATION   Patient Name: Karen Bonilla MRN: [id number] DOB:1940-12-26, 81 y.o., female Today's Date: 10/10/2022   PT End of Session - 10/10/22 1249     Visit Number 4    Number of Visits 13    Date for PT Re-Evaluation 11/07/22    Authorization Type [employer]    Authorization Time Period 09/26/22 to 11/07/22    PT Start Time 1400    PT Stop Time 1443    PT Time Calculation (min) 43 min    Activity Tolerance Patient tolerated treatment well    Behavior During Therapy [facility] for tasks assessed/performed                Past Medical History:  Diagnosis Date   Abnormal chest CT    Anemia    Anosmia    Aortic atherosclerosis (HCC)    Arthritis    Back pain of lumbar region with sciatica    Basal cell carcinoma 09/01/2002   right shin tx exc   BCC (basal cell carcinoma of skin) 06/20/2013   right forearm TX WITH BX   BCC (basal cell carcinoma of skin) 06/20/2013   right upper lip TX WITH BX   BCC (basal cell carcinoma of skin) 10/03/2016   mid upper back TX WITH BX   CAD (coronary artery disease)    Colon polyps    COPD (chronic obstructive pulmonary disease) (HCC)    DDD (degenerative disc disease), lumbar    Diverticulosis large intestine w/o perforation or abscess w/o bleeding    Emphysema, unspecified (HCC)    History of colonic polyps    Hypercholesterolemia    Hyperlipidemia    Inguinal hernia    Mucoid cyst of joint    SCC (squamous cell carcinoma) 01/23/2011   left forearm scc/ka  TX CX3 5FU CAUTERY   SCC (squamous cell carcinoma) 10/03/2016   right shin scc/ka TX CX3 5FU CAUTERY   SCC (squamous cell carcinoma) 07/29/2019   left forehead scc TX CX3 5FU CAUTERY   Scoliosis of lumbar spine, unspecified scoliosis type    Seasonal allergies    Senile purpura ([place])    Spinal stenosis    Spinal stenosis    Squamous cell carcinoma of skin 10/23/2005   left lower shin inf. bowens  TX CX3 5FU CAUTERY   Squamous cell  carcinoma of skin 08/06/2020   in situ- right lower leg (CX35FU)   Squamous cell carcinoma of skin 08/06/2020   in situ- right lower leg- posterior lower (CX35FU)   Varicose veins of lower extremities with other complications 34/74/2595   Past Surgical History:  Procedure Laterality Date   ABDOMINAL HYSTERECTOMY     APPENDECTOMY     INGUINAL HERNIA REPAIR Bilateral 01/20/2022   Procedure: LAPAROSCOPIC BILATERAL INGUINAL HERNIA REPAIR WITH MESH;  Surgeon: Kinsinger, Arta Bruce, MD;  Location: [facility];  Service: General;  Laterality: Bilateral;   MASS EXCISION  01/11/2012   Procedure: MINOR EXCISION OF MASS;  Surgeon: Cammie Sickle., MD;  Location: [place];  Service: Orthopedics;  Laterality: Right;  excision mucoid cyst right index and dip joint debridement   Patient Active Problem List   Diagnosis Date Noted   Bilateral inguinal hernia 01/20/2022   Anosmia 02/02/2021   Degeneration of lumbar intervertebral disc 02/02/2021   Hardening of the aorta (main artery of the heart) ([place]) 02/02/2021   Hypercholesterolemia 02/02/2021   Other seasonal allergic rhinitis 02/02/2021   Other specified disorders of  bone density and structure, other site 02/02/2021   Personal history of colonic polyps 02/02/2021   Chronic obstructive pulmonary disease ([place]) 02/02/2021   Senile purpura ([place]) 02/02/2021   Spinal stenosis 02/02/2021   Abnormal findings on diagnostic imaging of other specified body structures 02/02/2021   Coronary artery calcification 03/19/2018   Ganglion of left wrist 03/07/2017   Varicose veins of lower extremities with other complications 03/55/9741    PCP: Yaakov Guthrie   REFERRING PROVIDER: Marvis Moeller, NP   REFERRING DIAG: M54.16 (ICD-10-CM) - Radiculopathy, lumbar region   Rationale for Evaluation and Treatment Rehabilitation  THERAPY DIAG:  Other low back pain  Pain in left hip  Muscle weakness (generalized)  Other abnormalities of gait  and mobility  ONSET DATE: 09/11/2022   SUBJECTIVE:                                                                                                                                                                                           SUBJECTIVE STATEMENT: The patient tried the clamshell without the band. She had some pain in her groin. She reports with the band she has no pain.    PERTINENT HISTORY:    PAIN:  Are you having pain? No no pain sitting down; when I stand it catches me across my L groin and something pulls down to my knee on the inside of my leg. Leg pain can get to 2-3/10 at worst when she first gets up , back pain can get to 3-4/10 at worst    PRECAUTIONS: None  WEIGHT BEARING RESTRICTIONS No  FALLS:  Has patient fallen in last 6 months? No  LIVING ENVIRONMENT: Lives with: lives alone Lives in: House/apartment Stairs: 6 STE B rails, no steps inside  Has following equipment at home: Single point cane  OCCUPATION: retired   PLOF: Independent, Independent with basic ADLs, Independent with gait, and Independent with transfers  PATIENT GOALS stop leg pain, "leg pain is my main concern, I don't think there will be anything that will stop the back pain"    OBJECTIVE:   DIAGNOSTIC FINDINGS:    PATIENT SURVEYS:  FOTO 53.3  SCREENING FOR RED FLAGS: Bowel or bladder incontinence: No Spinal tumors: No Cauda equina syndrome: No Compression fracture: No Abdominal aneurysm: No  COGNITION:  Overall cognitive status: Within functional limits for tasks assessed     SENSATION: Not tested  MUSCLE LENGTH: Quads severe limitation B, hip flexors severe limitation B, B hamstrings moderate limitation, B piriformis moderate limitation B  POSTURE: rounded shoulders, forward head, decreased lumbar lordosis, increased thoracic kyphosis, and flexed trunk   PALPATION: B  lumbar paraspinals tight and TTP, L glutes and piriformis TTP and tight   LUMBAR ROM:   Active   A/PROM  eval  Flexion Mild limitation   Extension Severe limitation   Right lateral flexion Severe limitation   Left lateral flexion Severe limitation   Right rotation   Left rotation    (Blank rows = not tested)  L hip globally less mobile than the R PROM   LOWER EXTREMITY ROM:     Active  Right eval Left eval  Hip flexion    Hip extension    Hip abduction    Hip adduction    Hip internal rotation    Hip external rotation    Knee flexion    Knee extension    Ankle dorsiflexion    Ankle plantarflexion    Ankle inversion    Ankle eversion     (Blank rows = not tested)  LOWER EXTREMITY MMT:    MMT Right eval Left eval  Hip flexion 4 4  Hip extension 3- 3-  Hip abduction 3 4  Hip adduction    Hip internal rotation    Hip external rotation    Knee flexion 4 4  Knee extension 5 5  Ankle dorsiflexion 5 5  Ankle plantarflexion    Ankle inversion    Ankle eversion     (Blank rows = not tested)  LUMBAR SPECIAL TESTS:  RLE slightly longer than L did not attempt MET today   FUNCTIONAL TESTS:    GAIT: Distance walked: in clinic distances  Assistive device utilized: None Level of assistance: Complete Independence Comments: flexed at hips, Pelvis shifted to the R/shoulders to the L due to scoliosis, + trend     TODAY'S TREATMENT  10/16 Pt seen for aquatic therapy today.  Treatment took place in water 3.25-4 ft in depth at the [facility] pool. Temp of water was 91.  Pt entered/exited the pool via stairs (step through pattern) independently with bilat rail.  - warm up: long strides; side steps ; hip opener, march 2 laps - Standing hip 3 way x20 each leg with cuing for posture and technique  - heel raises x20  - Rainbow weights   - winging x20   - extnesion 2x20   - quick extension x20  - board reach x20  - board rotation x20     Pt requires buoyancy for support and to offload joints with strengthening exercises. Viscosity of the water is  needed for resistance of strengthening; water current perturbations provides challenge to standing balance unsupported, requiring increased core activation.   10/12 Supine march x20  Supine hip abdcution red 2x15  Supine bridge 2x10   Hamstring stretch 3x20 sec hold  Ball roll out 3x10   Row 3x10 red   Reviewed all exercises with her HEP   Nu-step 5 min     PATIENT EDUCATION:  Education details: exam findings, POC, HEP  Person educated: Patient Education method: Consulting civil engineer, Demonstration, and Handouts Education comprehension: verbalized understanding and returned demonstration   HOME EXERCISE PROGRAM: [id number]  ASSESSMENT:  CLINICAL IMPRESSION: The patient reported intermittent groin soreness in the area that she feels she injured with end range clamshells. She was advised to rest a few days and see how it feels. She was advised not to go to end ranges. Therapy reviewed this with her last session. She tolerated the water well. She is going to water aerobics. We reviewed how to use the exercises for breathing and posture.  We will resume land visits next wweek OBJECTIVE IMPAIRMENTS Abnormal gait, decreased mobility, difficulty walking, decreased ROM, decreased strength, hypomobility, increased fascial restrictions, increased muscle spasms, impaired flexibility, postural dysfunction, and pain.   ACTIVITY LIMITATIONS standing, sleeping, transfers, dressing, and locomotion level  PARTICIPATION LIMITATIONS: cleaning, laundry, shopping, community activity, and yard work  PERSONAL FACTORS Age, Behavior pattern, Past/current experiences, and Time since onset of injury/illness/exacerbation are also affecting patient's functional outcome.   REHAB POTENTIAL: Good  CLINICAL DECISION MAKING: Stable/uncomplicated  EVALUATION COMPLEXITY: Low   GOALS: Goals reviewed with patient? Yes  SHORT TERM GOALS: Target date: 10/17/2022  Will be compliant with appropriate progressive HEP   Baseline: Goal status: INITIAL  2.  Will demonstrate at least a 50% improvement in piriformis and HS flexibility B  Baseline:  Goal status: INITIAL  3.  LE pain to be no more than 4/10 at worst when standing for 60 minutes to cook  Baseline:  Goal status: INITIAL  4.  Will have better understanding of posture and general ergonomics  Baseline:  Goal status: INITIAL   LONG TERM GOALS: Target date: 11/07/2022  MMT to improve by at least 1 grade in all weak groups  Baseline:  Goal status: INITIAL  2.  Low back and LE pain to be no more than 2/10 with extended standing for greater than 90 minutes  Baseline:  Goal status: INITIAL  3.  Will be able to sleep for at least 4 hours without waking due to pain  Baseline:  Goal status: INITIAL  4.  Will be compliant with appropriate gym vs advanced HEP to maintain functional gains and prevent recurrence of pain  Baseline:  Goal status: INITIAL    PLAN: PT FREQUENCY: 2x/week  PT DURATION: 6 weeks  PLANNED INTERVENTIONS: Therapeutic exercises, Therapeutic activity, Neuromuscular re-education, Balance training, Gait training, Patient/Family education, Self Care, Aquatic Therapy, Manual therapy, and Re-evaluation.  PLAN FOR NEXT SESSION: core and hip strengthening, flexibility, hip ROM. Consider trying lumbar shifts to L to see if this helps pain, also consider shoe lift    Carolyne Littles PT DPT  10/10/2022, 12:55 PM   Referring diagnosis? M54.16  Treatment diagnosis? (if different than referring diagnosis) M54.59, M25.552, M62.81, R26.89 What was this (referring dx) caused by? []  Surgery []  Fall [x]  Ongoing issue [x]  Arthritis []  Other: ____________  Laterality: []  Rt [x]  Lt []  Both  Check all possible CPT codes:  *CHOOSE 10 OR LESS*    []  97110 (Therapeutic Exercise)  []  92507 (SLP Treatment)  []  97112 (Neuro Re-ed)   []  92526 (Swallowing Treatment)   []  97116 (Gait Training)   []  [phone] (Cognitive Training, 1st 15  minutes) []  97140 (Manual Therapy)   []  97130 (Cognitive Training, each add'l 15 minutes)  []  97164 (Re-evaluation)                              []  Other, List CPT Code ____________  []  97530 (Therapeutic Activities)     []  97535 (Self Care)   [x]  All codes above (97110 - 97535)  []  97012 (Mechanical Traction)  []  97014 (E-stim Unattended)  []  97032 (E-stim manual)  []  97033 (Ionto)  []  97035 (Ultrasound) []  97750 (Physical Performance Training) [x]  [id number] (Aquatic Therapy) []  97016 (Vasopneumatic Device) []  [id number] (Paraffin) []  97034 (Contrast Bath) []  97597 (Wound Care 1st 20 sq cm) []  97598 (Wound Care each add'l 20 sq cm) []  97760 (Orthotic Fabrication, Fitting, Training Initial) []   97761 (Prosthetic Management and Training Initial) $RemoveBeforeDEI'[]'[id number]$  [phone] (Orthotic or Prosthetic Training/ Modification Subsequent)

## 2022-10-10 ENCOUNTER — Encounter (HOSPITAL_BASED_OUTPATIENT_CLINIC_OR_DEPARTMENT_OTHER): Payer: Self-pay | Admitting: Physical Therapy

## 2022-10-13 ENCOUNTER — Ambulatory Visit (HOSPITAL_BASED_OUTPATIENT_CLINIC_OR_DEPARTMENT_OTHER): Payer: Medicare PPO | Admitting: Physical Therapy

## 2022-10-13 ENCOUNTER — Encounter (HOSPITAL_BASED_OUTPATIENT_CLINIC_OR_DEPARTMENT_OTHER): Payer: Self-pay | Admitting: Physical Therapy

## 2022-10-13 DIAGNOSIS — M25552 Pain in left hip: Secondary | ICD-10-CM

## 2022-10-13 DIAGNOSIS — R2689 Other abnormalities of gait and mobility: Secondary | ICD-10-CM

## 2022-10-13 DIAGNOSIS — M5459 Other low back pain: Secondary | ICD-10-CM

## 2022-10-13 DIAGNOSIS — M6281 Muscle weakness (generalized): Secondary | ICD-10-CM

## 2022-10-13 NOTE — Therapy (Signed)
OUTPATIENT PHYSICAL THERAPY THORACOLUMBAR EVALUATION   Patient Name: Karen Bonilla MRN: [id number] DOB:23-Jun-1941, 81 y.o., female Today's Date: 10/13/2022   PT End of Session - 10/13/22 1209     Visit Number 5    Number of Visits 13    Date for PT Re-Evaluation 11/07/22    Authorization Type [employer]    Authorization Time Period 09/26/22 to 11/07/22    PT Start Time 1145    PT Stop Time 1227    PT Time Calculation (min) 42 min    Activity Tolerance Patient tolerated treatment well    Behavior During Therapy [facility] for tasks assessed/performed                Past Medical History:  Diagnosis Date   Abnormal chest CT    Anemia    Anosmia    Aortic atherosclerosis (HCC)    Arthritis    Back pain of lumbar region with sciatica    Basal cell carcinoma 09/01/2002   right shin tx exc   BCC (basal cell carcinoma of skin) 06/20/2013   right forearm TX WITH BX   BCC (basal cell carcinoma of skin) 06/20/2013   right upper lip TX WITH BX   BCC (basal cell carcinoma of skin) 10/03/2016   mid upper back TX WITH BX   CAD (coronary artery disease)    Colon polyps    COPD (chronic obstructive pulmonary disease) (HCC)    DDD (degenerative disc disease), lumbar    Diverticulosis large intestine w/o perforation or abscess w/o bleeding    Emphysema, unspecified (HCC)    History of colonic polyps    Hypercholesterolemia    Hyperlipidemia    Inguinal hernia    Mucoid cyst of joint    SCC (squamous cell carcinoma) 01/23/2011   left forearm scc/ka  TX CX3 5FU CAUTERY   SCC (squamous cell carcinoma) 10/03/2016   right shin scc/ka TX CX3 5FU CAUTERY   SCC (squamous cell carcinoma) 07/29/2019   left forehead scc TX CX3 5FU CAUTERY   Scoliosis of lumbar spine, unspecified scoliosis type    Seasonal allergies    Senile purpura ([place])    Spinal stenosis    Spinal stenosis    Squamous cell carcinoma of skin 10/23/2005   left lower shin inf. bowens  TX CX3 5FU CAUTERY   Squamous cell  carcinoma of skin 08/06/2020   in situ- right lower leg ([id number])   Squamous cell carcinoma of skin 08/06/2020   in situ- right lower leg- posterior lower (CX35FU)   Varicose veins of lower extremities with other complications 54/98/2641   Past Surgical History:  Procedure Laterality Date   ABDOMINAL HYSTERECTOMY     APPENDECTOMY     INGUINAL HERNIA REPAIR Bilateral 01/20/2022   Procedure: LAPAROSCOPIC BILATERAL INGUINAL HERNIA REPAIR WITH MESH;  Surgeon: Kinsinger, Arta Bruce, MD;  Location: [facility];  Service: General;  Laterality: Bilateral;   MASS EXCISION  01/11/2012   Procedure: MINOR EXCISION OF MASS;  Surgeon: Cammie Sickle., MD;  Location: [place];  Service: Orthopedics;  Laterality: Right;  excision mucoid cyst right index and dip joint debridement   Patient Active Problem List   Diagnosis Date Noted   Bilateral inguinal hernia 01/20/2022   Anosmia 02/02/2021   Degeneration of lumbar intervertebral disc 02/02/2021   Hardening of the aorta (main artery of the heart) ([place]) 02/02/2021   Hypercholesterolemia 02/02/2021   Other seasonal allergic rhinitis 02/02/2021   Other specified disorders of  bone density and structure, other site 02/02/2021   Personal history of colonic polyps 02/02/2021   Chronic obstructive pulmonary disease ([place]) 02/02/2021   Senile purpura ([place]) 02/02/2021   Spinal stenosis 02/02/2021   Abnormal findings on diagnostic imaging of other specified body structures 02/02/2021   Coronary artery calcification 03/19/2018   Ganglion of left wrist 03/07/2017   Varicose veins of lower extremities with other complications 27/06/8674    PCP: Yaakov Guthrie   REFERRING PROVIDER: Marvis Moeller, NP   REFERRING DIAG: M54.16 (ICD-10-CM) - Radiculopathy, lumbar region   Rationale for Evaluation and Treatment Rehabilitation  THERAPY DIAG:  Other low back pain  Pain in left hip  Muscle weakness (generalized)  Other abnormalities of gait  and mobility  ONSET DATE: 09/11/2022   SUBJECTIVE:                                                                                                                                                                                           SUBJECTIVE STATEMENT: The patient tried the clamshell without the band. She had some pain in her groin. She reports with the band she has no pain.    PERTINENT HISTORY:    PAIN:  Are you having pain? No no pain sitting down; when I stand it catches me across my L groin and something pulls down to my knee on the inside of my leg. Leg pain can get to 2-3/10 at worst when she first gets up , back pain can get to 3-4/10 at worst    PRECAUTIONS: None  WEIGHT BEARING RESTRICTIONS No  FALLS:  Has patient fallen in last 6 months? No  LIVING ENVIRONMENT: Lives with: lives alone Lives in: House/apartment Stairs: 6 STE B rails, no steps inside  Has following equipment at home: Single point cane  OCCUPATION: retired   PLOF: Independent, Independent with basic ADLs, Independent with gait, and Independent with transfers  PATIENT GOALS stop leg pain, "leg pain is my main concern, I don't think there will be anything that will stop the back pain"    OBJECTIVE:   DIAGNOSTIC FINDINGS:    PATIENT SURVEYS:  FOTO 53.3  SCREENING FOR RED FLAGS: Bowel or bladder incontinence: No Spinal tumors: No Cauda equina syndrome: No Compression fracture: No Abdominal aneurysm: No  COGNITION:  Overall cognitive status: Within functional limits for tasks assessed     SENSATION: Not tested  MUSCLE LENGTH: Quads severe limitation B, hip flexors severe limitation B, B hamstrings moderate limitation, B piriformis moderate limitation B  POSTURE: rounded shoulders, forward head, decreased lumbar lordosis, increased thoracic kyphosis, and flexed trunk   PALPATION: B  lumbar paraspinals tight and TTP, L glutes and piriformis TTP and tight   LUMBAR ROM:   Active   A/PROM  eval  Flexion Mild limitation   Extension Severe limitation   Right lateral flexion Severe limitation   Left lateral flexion Severe limitation   Right rotation   Left rotation    (Blank rows = not tested)  L hip globally less mobile than the R PROM   LOWER EXTREMITY ROM:     Active  Right eval Left eval  Hip flexion    Hip extension    Hip abduction    Hip adduction    Hip internal rotation    Hip external rotation    Knee flexion    Knee extension    Ankle dorsiflexion    Ankle plantarflexion    Ankle inversion    Ankle eversion     (Blank rows = not tested)  LOWER EXTREMITY MMT:    MMT Right eval Left eval  Hip flexion 4 4  Hip extension 3- 3-  Hip abduction 3 4  Hip adduction    Hip internal rotation    Hip external rotation    Knee flexion 4 4  Knee extension 5 5  Ankle dorsiflexion 5 5  Ankle plantarflexion    Ankle inversion    Ankle eversion     (Blank rows = not tested)  LUMBAR SPECIAL TESTS:  RLE slightly longer than L did not attempt MET today   FUNCTIONAL TESTS:    GAIT: Distance walked: in clinic distances  Assistive device utilized: None Level of assistance: Complete Independence Comments: flexed at hips, Pelvis shifted to the R/shoulders to the L due to scoliosis, + trend     TODAY'S TREATMENT  10/17 Supine march x20  Supine bridge 2x10  LTR x20    Bilateral er 2x10 red Bilateral horizontal abduction 2x10 red Bilateral flexion  2x10  red band   Row 2x10 red   Nu-step 5 min L 5    10/16 Pt seen for aquatic therapy today.  Treatment took place in water 3.25-4 ft in depth at the [facility] pool. Temp of water was 91.  Pt entered/exited the pool via stairs (step through pattern) independently with bilat rail.  - warm up: long strides; side steps ; hip opener, march 2 laps - Standing hip 3 way x20 each leg with cuing for posture and technique  - heel raises x20  - Rainbow weights   - winging x20   -  extnesion 2x20   - quick extension x20  - board reach x20  - board rotation x20     Pt requires buoyancy for support and to offload joints with strengthening exercises. Viscosity of the water is needed for resistance of strengthening; water current perturbations provides challenge to standing balance unsupported, requiring increased core activation.   10/12 Supine march x20  Supine hip abdcution red 2x15  Supine bridge 2x10   Hamstring stretch 3x20 sec hold  Ball roll out 3x10   Row 3x10 red   Reviewed all exercises with her HEP   Nu-step 5 min     PATIENT EDUCATION:  Education details: exam findings, POC, HEP  Person educated: Patient Education method: Consulting civil engineer, Demonstration, and Handouts Education comprehension: verbalized understanding and returned demonstration   HOME EXERCISE PROGRAM: [id number]  ASSESSMENT:  CLINICAL IMPRESSION: The patient reported intermittent groin soreness in the area that she feels she injured with end range clamshells. She was advised to rest a  few days and see how it feels. She was advised not to go to end ranges. Therapy reviewed this with her last session. She tolerated the water well. She is going to water aerobics. We reviewed how to use the exercises for breathing and posture. We will resume land visits next wweek OBJECTIVE IMPAIRMENTS Abnormal gait, decreased mobility, difficulty walking, decreased ROM, decreased strength, hypomobility, increased fascial restrictions, increased muscle spasms, impaired flexibility, postural dysfunction, and pain.   ACTIVITY LIMITATIONS standing, sleeping, transfers, dressing, and locomotion level  PARTICIPATION LIMITATIONS: cleaning, laundry, shopping, community activity, and yard work  PERSONAL FACTORS Age, Behavior pattern, Past/current experiences, and Time since onset of injury/illness/exacerbation are also affecting patient's functional outcome.   REHAB POTENTIAL: Good  CLINICAL DECISION  MAKING: Stable/uncomplicated  EVALUATION COMPLEXITY: Low   GOALS: Goals reviewed with patient? Yes  SHORT TERM GOALS: Target date: 10/17/2022  Will be compliant with appropriate progressive HEP  Baseline: Goal status: INITIAL  2.  Will demonstrate at least a 50% improvement in piriformis and HS flexibility B  Baseline:  Goal status: INITIAL  3.  LE pain to be no more than 4/10 at worst when standing for 60 minutes to cook  Baseline:  Goal status: INITIAL  4.  Will have better understanding of posture and general ergonomics  Baseline:  Goal status: INITIAL   LONG TERM GOALS: Target date: 11/07/2022  MMT to improve by at least 1 grade in all weak groups  Baseline:  Goal status: INITIAL  2.  Low back and LE pain to be no more than 2/10 with extended standing for greater than 90 minutes  Baseline:  Goal status: INITIAL  3.  Will be able to sleep for at least 4 hours without waking due to pain  Baseline:  Goal status: INITIAL  4.  Will be compliant with appropriate gym vs advanced HEP to maintain functional gains and prevent recurrence of pain  Baseline:  Goal status: INITIAL    PLAN: PT FREQUENCY: 2x/week  PT DURATION: 6 weeks  PLANNED INTERVENTIONS: Therapeutic exercises, Therapeutic activity, Neuromuscular re-education, Balance training, Gait training, Patient/Family education, Self Care, Aquatic Therapy, Manual therapy, and Re-evaluation.  PLAN FOR NEXT SESSION: core and hip strengthening, flexibility, hip ROM. Consider trying lumbar shifts to L to see if this helps pain, also consider shoe lift    Carolyne Littles PT DPT  10/13/2022, 12:14 PM   Referring diagnosis? M54.16  Treatment diagnosis? (if different than referring diagnosis) M54.59, M25.552, M62.81, R26.89 What was this (referring dx) caused by? []  Surgery []  Fall [x]  Ongoing issue [x]  Arthritis []  Other: ____________  Laterality: []  Rt [x]  Lt []  Both  Check all possible CPT  codes:  *CHOOSE 10 OR LESS*    []  97110 (Therapeutic Exercise)  []  92507 (SLP Treatment)  []  97112 (Neuro Re-ed)   []  92526 (Swallowing Treatment)   []  97116 (Gait Training)   []  [id number] (Cognitive Training, 1st 15 minutes) []  97140 (Manual Therapy)   []  97130 (Cognitive Training, each add'l 15 minutes)  []  97164 (Re-evaluation)                              []  Other, List CPT Code ____________  []  63875 (Therapeutic Activities)     []  97535 (Self Care)   [x]  All codes above (97110 - 97535)  []  97012 (Mechanical Traction)  []  97014 (E-stim Unattended)  []  97032 (E-stim manual)  []  97033 (Ionto)  []  97035 (Ultrasound) []   97750 (Physical Performance Training) [x]  [id number] (Aquatic Therapy) []  97016 (Vasopneumatic Device) []  [id number] (Paraffin) []  97034 (Contrast Bath) []  97597 (Wound Care 1st 20 sq cm) []  97598 (Wound Care each add'l 20 sq cm) []  97760 (Orthotic Fabrication, Fitting, Training Initial) []  [id number] (Prosthetic Management and Training Initial) []  [id number] (Orthotic or Prosthetic Training/ Modification Subsequent)

## 2022-10-14 ENCOUNTER — Encounter (HOSPITAL_BASED_OUTPATIENT_CLINIC_OR_DEPARTMENT_OTHER): Payer: Self-pay | Admitting: Physical Therapy

## 2022-10-17 ENCOUNTER — Ambulatory Visit (HOSPITAL_BASED_OUTPATIENT_CLINIC_OR_DEPARTMENT_OTHER): Payer: Medicare PPO | Admitting: Physical Therapy

## 2022-10-17 ENCOUNTER — Encounter (HOSPITAL_BASED_OUTPATIENT_CLINIC_OR_DEPARTMENT_OTHER): Payer: Self-pay | Admitting: Physical Therapy

## 2022-10-17 DIAGNOSIS — M25552 Pain in left hip: Secondary | ICD-10-CM

## 2022-10-17 DIAGNOSIS — M6281 Muscle weakness (generalized): Secondary | ICD-10-CM

## 2022-10-17 DIAGNOSIS — M5459 Other low back pain: Secondary | ICD-10-CM

## 2022-10-17 DIAGNOSIS — R2689 Other abnormalities of gait and mobility: Secondary | ICD-10-CM | POA: Diagnosis not present

## 2022-10-17 NOTE — Therapy (Signed)
OUTPATIENT PHYSICAL THERAPY THORACOLUMBAR EVALUATION   Patient Name: JAINE ESTABROOKS MRN: [id number] DOB:1941/09/30, 81 y.o., female Today's Date: 10/17/2022   PT End of Session - 10/17/22 1157     Visit Number 6    Number of Visits 13    Date for PT Re-Evaluation 11/07/22    Authorization Type [employer]    Authorization Time Period 09/26/22 to 11/07/22    PT Start Time 1153   Patient 8 min late   PT Stop Time 1232    PT Time Calculation (min) 39 min    Activity Tolerance Patient tolerated treatment well    Behavior During Therapy [facility] for tasks assessed/performed                Past Medical History:  Diagnosis Date   Abnormal chest CT    Anemia    Anosmia    Aortic atherosclerosis (HCC)    Arthritis    Back pain of lumbar region with sciatica    Basal cell carcinoma 09/01/2002   right shin tx exc   BCC (basal cell carcinoma of skin) 06/20/2013   right forearm TX WITH BX   BCC (basal cell carcinoma of skin) 06/20/2013   right upper lip TX WITH BX   BCC (basal cell carcinoma of skin) 10/03/2016   mid upper back TX WITH BX   CAD (coronary artery disease)    Colon polyps    COPD (chronic obstructive pulmonary disease) (HCC)    DDD (degenerative disc disease), lumbar    Diverticulosis large intestine w/o perforation or abscess w/o bleeding    Emphysema, unspecified (HCC)    History of colonic polyps    Hypercholesterolemia    Hyperlipidemia    Inguinal hernia    Mucoid cyst of joint    SCC (squamous cell carcinoma) 01/23/2011   left forearm scc/ka  TX CX3 5FU CAUTERY   SCC (squamous cell carcinoma) 10/03/2016   right shin scc/ka TX CX3 5FU CAUTERY   SCC (squamous cell carcinoma) 07/29/2019   left forehead scc TX CX3 5FU CAUTERY   Scoliosis of lumbar spine, unspecified scoliosis type    Seasonal allergies    Senile purpura ([place])    Spinal stenosis    Spinal stenosis    Squamous cell carcinoma of skin 10/23/2005   left lower shin inf. bowens  TX CX3 5FU CAUTERY    Squamous cell carcinoma of skin 08/06/2020   in situ- right lower leg ([id number])   Squamous cell carcinoma of skin 08/06/2020   in situ- right lower leg- posterior lower ([id number])   Varicose veins of lower extremities with other complications 65/78/4696   Past Surgical History:  Procedure Laterality Date   ABDOMINAL HYSTERECTOMY     APPENDECTOMY     INGUINAL HERNIA REPAIR Bilateral 01/20/2022   Procedure: LAPAROSCOPIC BILATERAL INGUINAL HERNIA REPAIR WITH MESH;  Surgeon: Kinsinger, Arta Bruce, MD;  Location: [facility];  Service: General;  Laterality: Bilateral;   MASS EXCISION  01/11/2012   Procedure: MINOR EXCISION OF MASS;  Surgeon: Cammie Sickle., MD;  Location: [place];  Service: Orthopedics;  Laterality: Right;  excision mucoid cyst right index and dip joint debridement   Patient Active Problem List   Diagnosis Date Noted   Bilateral inguinal hernia 01/20/2022   Anosmia 02/02/2021   Degeneration of lumbar intervertebral disc 02/02/2021   Hardening of the aorta (main artery of the heart) ([place]) 02/02/2021   Hypercholesterolemia 02/02/2021   Other seasonal allergic rhinitis 02/02/2021  Other specified disorders of bone density and structure, other site 02/02/2021   Personal history of colonic polyps 02/02/2021   Chronic obstructive pulmonary disease ([place]) 02/02/2021   Senile purpura ([place]) 02/02/2021   Spinal stenosis 02/02/2021   Abnormal findings on diagnostic imaging of other specified body structures 02/02/2021   Coronary artery calcification 03/19/2018   Ganglion of left wrist 03/07/2017   Varicose veins of lower extremities with other complications 16/38/4665    PCP: Yaakov Guthrie   REFERRING PROVIDER: Marvis Moeller, NP   REFERRING DIAG: M54.16 (ICD-10-CM) - Radiculopathy, lumbar region   Rationale for Evaluation and Treatment Rehabilitation  THERAPY DIAG:  Other low back pain  Pain in left hip  Muscle weakness (generalized)  Other  abnormalities of gait and mobility  ONSET DATE: 09/11/2022   SUBJECTIVE:                                                                                                                                                                                           SUBJECTIVE STATEMENT: The patient continues to have pain in her groin. Overall she reports the pain comes and goes. She had no pain for two days, then last night she ha pain down both legs.    PERTINENT HISTORY:    PAIN:  Are you having pain? No no pain sitting down; when I stand it catches me across my L groin and something pulls down to my knee on the inside of my leg. Leg pain can get to 2-3/10 at worst when she first gets up , back pain can get to 3-4/10 at worst    PRECAUTIONS: None  WEIGHT BEARING RESTRICTIONS No  FALLS:  Has patient fallen in last 6 months? No  LIVING ENVIRONMENT: Lives with: lives alone Lives in: House/apartment Stairs: 6 STE B rails, no steps inside  Has following equipment at home: Single point cane  OCCUPATION: retired   PLOF: Independent, Independent with basic ADLs, Independent with gait, and Independent with transfers  PATIENT GOALS stop leg pain, "leg pain is my main concern, I don't think there will be anything that will stop the back pain"    OBJECTIVE:   DIAGNOSTIC FINDINGS:    PATIENT SURVEYS:  FOTO 53.3  SCREENING FOR RED FLAGS: Bowel or bladder incontinence: No Spinal tumors: No Cauda equina syndrome: No Compression fracture: No Abdominal aneurysm: No  COGNITION:  Overall cognitive status: Within functional limits for tasks assessed     SENSATION: Not tested  MUSCLE LENGTH: Quads severe limitation B, hip flexors severe limitation B, B hamstrings moderate limitation, B piriformis moderate limitation B  POSTURE: rounded shoulders, forward head,  decreased lumbar lordosis, increased thoracic kyphosis, and flexed trunk   PALPATION: B lumbar paraspinals tight and TTP,  L glutes and piriformis TTP and tight   LUMBAR ROM:   Active  A/PROM  eval  Flexion Mild limitation   Extension Severe limitation   Right lateral flexion Severe limitation   Left lateral flexion Severe limitation   Right rotation   Left rotation    (Blank rows = not tested)  L hip globally less mobile than the R PROM   LOWER EXTREMITY ROM:     Active  Right eval Left eval  Hip flexion    Hip extension    Hip abduction    Hip adduction    Hip internal rotation    Hip external rotation    Knee flexion    Knee extension    Ankle dorsiflexion    Ankle plantarflexion    Ankle inversion    Ankle eversion     (Blank rows = not tested)  LOWER EXTREMITY MMT:    MMT Right eval Left eval  Hip flexion 4 4  Hip extension 3- 3-  Hip abduction 3 4  Hip adduction    Hip internal rotation    Hip external rotation    Knee flexion 4 4  Knee extension 5 5  Ankle dorsiflexion 5 5  Ankle plantarflexion    Ankle inversion    Ankle eversion     (Blank rows = not tested)  LUMBAR SPECIAL TESTS:  RLE slightly longer than L did not attempt MET today   FUNCTIONAL TESTS:    GAIT: Distance walked: in clinic distances  Assistive device utilized: None Level of assistance: Complete Independence Comments: flexed at hips, Pelvis shifted to the R/shoulders to the L due to scoliosis, + trend     TODAY'S TREATMENT  10/24 Supine march x20  Supine bridge 2x10  LTR x20   Standing chop x15 each direction  Pallof press x15 each way     10/20 Supine march x20  Supine bridge 2x10  LTR x20    Bilateral er 2x10 red Bilateral horizontal abduction 2x10 red Bilateral flexion  2x10  red band   Row 2x10 red   Nu-step 5 min L 5   Manual therapy: trigger point release to hip and IT band   10/16 Pt seen for aquatic therapy today.  Treatment took place in water 3.25-4 ft in depth at the [facility] pool. Temp of water was 91.  Pt entered/exited the pool via stairs  (step through pattern) independently with bilat rail.  - warm up: long strides; side steps ; hip opener, march 2 laps - Standing hip 3 way x20 each leg with cuing for posture and technique  - heel raises x20  - Rainbow weights   - winging x20   - extnesion 2x20   - quick extension x20  - board reach x20  - board rotation x20     Pt requires buoyancy for support and to offload joints with strengthening exercises. Viscosity of the water is needed for resistance of strengthening; water current perturbations provides challenge to standing balance unsupported, requiring increased core activation.   10/12    PATIENT EDUCATION:  Education details: exam findings, POC, HEP  Person educated: Patient Education method: Explanation, Demonstration, and Handouts Education comprehension: verbalized understanding and returned demonstration   HOME EXERCISE PROGRAM: [id number]  ASSESSMENT:  CLINICAL IMPRESSION: Therapy added standing chops and pallof press to her pan. She tolerated well but did have  a little pulling. She is working out on her own consistently. She has been going to the [facility] consistently. We will reduce her to 1x a week. She had a little pain with the pallof press and chops. We will assess next visit.  OBJECTIVE IMPAIRMENTS Abnormal gait, decreased mobility, difficulty walking, decreased ROM, decreased strength, hypomobility, increased fascial restrictions, increased muscle spasms, impaired flexibility, postural dysfunction, and pain.   ACTIVITY LIMITATIONS standing, sleeping, transfers, dressing, and locomotion level  PARTICIPATION LIMITATIONS: cleaning, laundry, shopping, community activity, and yard work  PERSONAL FACTORS Age, Behavior pattern, Past/current experiences, and Time since onset of injury/illness/exacerbation are also affecting patient's functional outcome.   REHAB POTENTIAL: Good  CLINICAL DECISION MAKING: Stable/uncomplicated  EVALUATION COMPLEXITY:  Low   GOALS: Goals reviewed with patient? Yes  SHORT TERM GOALS: Target date: 10/17/2022  Will be compliant with appropriate progressive HEP  Baseline: Goal status: INITIAL  2.  Will demonstrate at least a 50% improvement in piriformis and HS flexibility B  Baseline:  Goal status: INITIAL  3.  LE pain to be no more than 4/10 at worst when standing for 60 minutes to cook  Baseline:  Goal status: INITIAL  4.  Will have better understanding of posture and general ergonomics  Baseline:  Goal status: INITIAL   LONG TERM GOALS: Target date: 11/07/2022  MMT to improve by at least 1 grade in all weak groups  Baseline:  Goal status: INITIAL  2.  Low back and LE pain to be no more than 2/10 with extended standing for greater than 90 minutes  Baseline:  Goal status: INITIAL  3.  Will be able to sleep for at least 4 hours without waking due to pain  Baseline:  Goal status: INITIAL  4.  Will be compliant with appropriate gym vs advanced HEP to maintain functional gains and prevent recurrence of pain  Baseline:  Goal status: INITIAL    PLAN: PT FREQUENCY: 2x/week  PT DURATION: 6 weeks  PLANNED INTERVENTIONS: Therapeutic exercises, Therapeutic activity, Neuromuscular re-education, Balance training, Gait training, Patient/Family education, Self Care, Aquatic Therapy, Manual therapy, and Re-evaluation.  PLAN FOR NEXT SESSION: core and hip strengthening, flexibility, hip ROM. Consider trying lumbar shifts to L to see if this helps pain, also consider shoe lift    Carolyne Littles PT DPT  10/17/2022, 12:15 PM   Referring diagnosis? M54.16  Treatment diagnosis? (if different than referring diagnosis) M54.59, M25.552, M62.81, R26.89 What was this (referring dx) caused by? []  Surgery []  Fall [x]  Ongoing issue [x]  Arthritis []  Other: ____________  Laterality: []  Rt [x]  Lt []  Both  Check all possible CPT codes:  *CHOOSE 10 OR LESS*    []  97110 (Therapeutic Exercise)  []   92507 (SLP Treatment)  []  97112 (Neuro Re-ed)   []  92526 (Swallowing Treatment)   []  97116 (Gait Training)   []  [id number] (Cognitive Training, 1st 15 minutes) []  97140 (Manual Therapy)   []  97130 (Cognitive Training, each add'l 15 minutes)  []  97164 (Re-evaluation)                              []  Other, List CPT Code ____________  []  90383 (Therapeutic Activities)     []  97535 (Self Care)   [x]  All codes above (97110 - 97535)  []  97012 (Mechanical Traction)  []  97014 (E-stim Unattended)  []  97032 (E-stim manual)  []  97033 (Ionto)  []  97035 (Ultrasound) []  97750 (Physical Performance Training) [x]  [id number] (Aquatic  Therapy) []  97016 (Vasopneumatic Device) []  [id number] (Paraffin) []  97034 (Contrast Bath) []  97597 (Wound Care 1st 20 sq cm) []  97598 (Wound Care each add'l 20 sq cm) []  97760 (Orthotic Fabrication, Fitting, Training Initial) []  [id number] (Prosthetic Management and Training Initial) []  [id number] (Orthotic or Prosthetic Training/ Modification Subsequent)

## 2022-10-20 ENCOUNTER — Encounter (HOSPITAL_BASED_OUTPATIENT_CLINIC_OR_DEPARTMENT_OTHER): Payer: Medicare PPO | Admitting: Physical Therapy

## 2022-10-24 ENCOUNTER — Ambulatory Visit (HOSPITAL_BASED_OUTPATIENT_CLINIC_OR_DEPARTMENT_OTHER): Payer: Medicare PPO | Admitting: Physical Therapy

## 2022-10-24 ENCOUNTER — Encounter (HOSPITAL_BASED_OUTPATIENT_CLINIC_OR_DEPARTMENT_OTHER): Payer: Self-pay | Admitting: Physical Therapy

## 2022-10-24 DIAGNOSIS — M5459 Other low back pain: Secondary | ICD-10-CM | POA: Diagnosis not present

## 2022-10-24 DIAGNOSIS — R2689 Other abnormalities of gait and mobility: Secondary | ICD-10-CM | POA: Diagnosis not present

## 2022-10-24 DIAGNOSIS — M6281 Muscle weakness (generalized): Secondary | ICD-10-CM

## 2022-10-24 DIAGNOSIS — M25552 Pain in left hip: Secondary | ICD-10-CM

## 2022-10-24 NOTE — Therapy (Signed)
OUTPATIENT PHYSICAL THERAPY THORACOLUMBAR EVALUATION   Patient Name: Karen Bonilla MRN: [id number] DOB:08/28/1941, 81 y.o., female Today's Date: 10/24/2022   PT End of Session - 10/24/22 1205     Visit Number 7    Number of Visits 13    Date for PT Re-Evaluation 11/07/22    Authorization Type [employer]    Authorization Time Period 09/26/22 to 11/07/22    PT Start Time 1145    PT Stop Time 1224    PT Time Calculation (min) 39 min    Activity Tolerance Patient tolerated treatment well    Behavior During Therapy [facility] for tasks assessed/performed                Past Medical History:  Diagnosis Date   Abnormal chest CT    Anemia    Anosmia    Aortic atherosclerosis (HCC)    Arthritis    Back pain of lumbar region with sciatica    Basal cell carcinoma 09/01/2002   right shin tx exc   BCC (basal cell carcinoma of skin) 06/20/2013   right forearm TX WITH BX   BCC (basal cell carcinoma of skin) 06/20/2013   right upper lip TX WITH BX   BCC (basal cell carcinoma of skin) 10/03/2016   mid upper back TX WITH BX   CAD (coronary artery disease)    Colon polyps    COPD (chronic obstructive pulmonary disease) (HCC)    DDD (degenerative disc disease), lumbar    Diverticulosis large intestine w/o perforation or abscess w/o bleeding    Emphysema, unspecified (HCC)    History of colonic polyps    Hypercholesterolemia    Hyperlipidemia    Inguinal hernia    Mucoid cyst of joint    SCC (squamous cell carcinoma) 01/23/2011   left forearm scc/ka  TX CX3 5FU CAUTERY   SCC (squamous cell carcinoma) 10/03/2016   right shin scc/ka TX CX3 5FU CAUTERY   SCC (squamous cell carcinoma) 07/29/2019   left forehead scc TX CX3 5FU CAUTERY   Scoliosis of lumbar spine, unspecified scoliosis type    Seasonal allergies    Senile purpura ([place])    Spinal stenosis    Spinal stenosis    Squamous cell carcinoma of skin 10/23/2005   left lower shin inf. bowens  TX CX3 5FU CAUTERY   Squamous cell  carcinoma of skin 08/06/2020   in situ- right lower leg (CX35FU)   Squamous cell carcinoma of skin 08/06/2020   in situ- right lower leg- posterior lower (CX35FU)   Varicose veins of lower extremities with other complications 79/15/0569   Past Surgical History:  Procedure Laterality Date   ABDOMINAL HYSTERECTOMY     APPENDECTOMY     INGUINAL HERNIA REPAIR Bilateral 01/20/2022   Procedure: LAPAROSCOPIC BILATERAL INGUINAL HERNIA REPAIR WITH MESH;  Surgeon: Kinsinger, Arta Bruce, MD;  Location: [facility];  Service: General;  Laterality: Bilateral;   MASS EXCISION  01/11/2012   Procedure: MINOR EXCISION OF MASS;  Surgeon: Cammie Sickle., MD;  Location: [place];  Service: Orthopedics;  Laterality: Right;  excision mucoid cyst right index and dip joint debridement   Patient Active Problem List   Diagnosis Date Noted   Bilateral inguinal hernia 01/20/2022   Anosmia 02/02/2021   Degeneration of lumbar intervertebral disc 02/02/2021   Hardening of the aorta (main artery of the heart) ([place]) 02/02/2021   Hypercholesterolemia 02/02/2021   Other seasonal allergic rhinitis 02/02/2021   Other specified disorders of  bone density and structure, other site 02/02/2021   Personal history of colonic polyps 02/02/2021   Chronic obstructive pulmonary disease ([place]) 02/02/2021   Senile purpura ([place]) 02/02/2021   Spinal stenosis 02/02/2021   Abnormal findings on diagnostic imaging of other specified body structures 02/02/2021   Coronary artery calcification 03/19/2018   Ganglion of left wrist 03/07/2017   Varicose veins of lower extremities with other complications 16/09/9603    PCP: Yaakov Guthrie   REFERRING PROVIDER: Marvis Moeller, NP   REFERRING DIAG: M54.16 (ICD-10-CM) - Radiculopathy, lumbar region   Rationale for Evaluation and Treatment Rehabilitation  THERAPY DIAG:  Other low back pain  Pain in left hip  Muscle weakness (generalized)  Other abnormalities of gait  and mobility  ONSET DATE: 09/11/2022   SUBJECTIVE:                                                                                                                                                                                           SUBJECTIVE STATEMENT: The patient continues to have pain in her groin. Overall she reports the pain comes and goes. She had no pain for two days, then last night she ha pain down both legs.    PERTINENT HISTORY:    PAIN:  Are you having pain? No no pain sitting down; when I stand it catches me across my L groin and something pulls down to my knee on the inside of my leg. Leg pain can get to 2-3/10 at worst when she first gets up , back pain can get to 3-4/10 at worst    PRECAUTIONS: None  WEIGHT BEARING RESTRICTIONS No  FALLS:  Has patient fallen in last 6 months? No  LIVING ENVIRONMENT: Lives with: lives alone Lives in: House/apartment Stairs: 6 STE B rails, no steps inside  Has following equipment at home: Single point cane  OCCUPATION: retired   PLOF: Independent, Independent with basic ADLs, Independent with gait, and Independent with transfers  PATIENT GOALS stop leg pain, "leg pain is my main concern, I don't think there will be anything that will stop the back pain"    OBJECTIVE:   DIAGNOSTIC FINDINGS:    PATIENT SURVEYS:  FOTO 53.3  SCREENING FOR RED FLAGS: Bowel or bladder incontinence: No Spinal tumors: No Cauda equina syndrome: No Compression fracture: No Abdominal aneurysm: No  COGNITION:  Overall cognitive status: Within functional limits for tasks assessed     SENSATION: Not tested  MUSCLE LENGTH: Quads severe limitation B, hip flexors severe limitation B, B hamstrings moderate limitation, B piriformis moderate limitation B  POSTURE: rounded shoulders, forward head, decreased lumbar lordosis, increased  thoracic kyphosis, and flexed trunk   PALPATION: B lumbar paraspinals tight and TTP, L glutes and  piriformis TTP and tight   LUMBAR ROM:   Active  A/PROM  eval  Flexion Mild limitation   Extension Severe limitation   Right lateral flexion Severe limitation   Left lateral flexion Severe limitation   Right rotation   Left rotation    (Blank rows = not tested)  L hip globally less mobile than the R PROM   LOWER EXTREMITY ROM:     Active  Right eval Left eval  Hip flexion    Hip extension    Hip abduction    Hip adduction    Hip internal rotation    Hip external rotation    Knee flexion    Knee extension    Ankle dorsiflexion    Ankle plantarflexion    Ankle inversion    Ankle eversion     (Blank rows = not tested)  LOWER EXTREMITY MMT:    MMT Right eval Left eval  Hip flexion 4 4  Hip extension 3- 3-  Hip abduction 3 4  Hip adduction    Hip internal rotation    Hip external rotation    Knee flexion 4 4  Knee extension 5 5  Ankle dorsiflexion 5 5  Ankle plantarflexion    Ankle inversion    Ankle eversion     (Blank rows = not tested)  LUMBAR SPECIAL TESTS:  RLE slightly longer than L did not attempt MET today   FUNCTIONAL TESTS:    GAIT: Distance walked: in clinic distances  Assistive device utilized: None Level of assistance: Complete Independence Comments: flexed at hips, Pelvis shifted to the R/shoulders to the L due to scoliosis, + trend     TODAY'S TREATMENT  10/24 Supine march x20  Supine bridge 2x10  LTR x20   Standing chop x15 each direction  Pallof press x15 each way   Standing hip abduction 2x15   Extension 2x10  March 2x15  Squat 2x10        10/20 Supine march x20  Supine bridge 2x10  LTR x20    Bilateral er 2x10 red Bilateral horizontal abduction 2x10 red Bilateral flexion  2x10  red band   Row 2x10 red   Nu-step 5 min L 5   Manual therapy: trigger point release to hip and IT band     PATIENT EDUCATION:  Education details: exam findings, POC, HEP  Person educated: Patient Education method:  Explanation, Demonstration, and Handouts Education comprehension: verbalized understanding and returned demonstration   HOME EXERCISE PROGRAM: [id number]  ASSESSMENT:  CLINICAL IMPRESSION: Therapy reviewed a standing stability series today. She tolerated well. We also her a left arm reach to stretch the left sie. She had a large spasm in her right QL. Hopefully unilateral stretching witll relive some tension on the opposite side. We updated her HEP and reviewed how to do it. We reviewed how to fund her trigger points using the thera-cane. We will continue to progress as tolerated.   OBJECTIVE IMPAIRMENTS Abnormal gait, decreased mobility, difficulty walking, decreased ROM, decreased strength, hypomobility, increased fascial restrictions, increased muscle spasms, impaired flexibility, postural dysfunction, and pain.   ACTIVITY LIMITATIONS standing, sleeping, transfers, dressing, and locomotion level  PARTICIPATION LIMITATIONS: cleaning, laundry, shopping, community activity, and yard work  PERSONAL FACTORS Age, Behavior pattern, Past/current experiences, and Time since onset of injury/illness/exacerbation are also affecting patient's functional outcome.   REHAB POTENTIAL: Good  CLINICAL DECISION MAKING:  Stable/uncomplicated  EVALUATION COMPLEXITY: Low   GOALS: Goals reviewed with patient? Yes  SHORT TERM GOALS: Target date: 10/17/2022  Will be compliant with appropriate progressive HEP  Baseline: Goal status: INITIAL  2.  Will demonstrate at least a 50% improvement in piriformis and HS flexibility B  Baseline:  Goal status: INITIAL  3.  LE pain to be no more than 4/10 at worst when standing for 60 minutes to cook  Baseline:  Goal status: INITIAL  4.  Will have better understanding of posture and general ergonomics  Baseline:  Goal status: INITIAL   LONG TERM GOALS: Target date: 11/07/2022  MMT to improve by at least 1 grade in all weak groups  Baseline:  Goal status:  INITIAL  2.  Low back and LE pain to be no more than 2/10 with extended standing for greater than 90 minutes  Baseline:  Goal status: INITIAL  3.  Will be able to sleep for at least 4 hours without waking due to pain  Baseline:  Goal status: INITIAL  4.  Will be compliant with appropriate gym vs advanced HEP to maintain functional gains and prevent recurrence of pain  Baseline:  Goal status: INITIAL    PLAN: PT FREQUENCY: 2x/week  PT DURATION: 6 weeks  PLANNED INTERVENTIONS: Therapeutic exercises, Therapeutic activity, Neuromuscular re-education, Balance training, Gait training, Patient/Family education, Self Care, Aquatic Therapy, Manual therapy, and Re-evaluation.  PLAN FOR NEXT SESSION: core and hip strengthening, flexibility, hip ROM. Consider trying lumbar shifts to L to see if this helps pain, also consider shoe lift    Carolyne Littles PT DPT  10/24/2022, 1:07 PM   Referring diagnosis? M54.16  Treatment diagnosis? (if different than referring diagnosis) M54.59, M25.552, M62.81, R26.89 What was this (referring dx) caused by? _0  Surgery _1  Fall _2  Ongoing issue _3  Arthritis _4  Other: ____________  Laterality: _5  Rt _6  Lt _7  Both  Check all possible CPT codes:  *CHOOSE 10 OR LESS*    _8  97110 (Therapeutic Exercise)  _9  92507 (SLP Treatment)  _10  01779 (Neuro Re-ed)   _11  92526 (Swallowing Treatment)   _12  39030 (Gait Training)   _13  09233 (Cognitive Training, 1st 15 minutes) _14  97140 (Manual Therapy)   _15  97130 (Cognitive Training, each add'l 15 minutes)  _16  97164 (Re-evaluation)                              _17  Other, List CPT Code ____________  _18  00762 (Therapeutic Activities)     _19  26333 (Self Care)   _20  All codes above (97110 - 97535)  _21  97012 (Mechanical Traction)  _22  97014 (E-stim Unattended)  _23  97032 (E-stim manual)  _24  97033 (Ionto)  _25  97035 (Ultrasound) _26  97750 (Physical Performance Training) _27  54562 (Aquatic Therapy) _28  97016 (Vasopneumatic  Device) _29  56389 (Paraffin) _30  37342 (Contrast Bath) _31  97597 (Wound Care 1st 20 sq cm) _32  97598 (Wound Care each add'l 20 sq cm) _33  97760 (Orthotic Fabrication, Fitting, Training Initial) _34  [id number] (Prosthetic Management and Training Initial) _35  [id number] (Orthotic or Prosthetic Training/ Modification Subsequent)

## 2022-10-27 ENCOUNTER — Encounter (HOSPITAL_BASED_OUTPATIENT_CLINIC_OR_DEPARTMENT_OTHER): Payer: Medicare PPO | Admitting: Physical Therapy

## 2022-10-31 ENCOUNTER — Ambulatory Visit (HOSPITAL_BASED_OUTPATIENT_CLINIC_OR_DEPARTMENT_OTHER): Payer: Medicare PPO | Attending: Neurosurgery | Admitting: Physical Therapy

## 2022-10-31 ENCOUNTER — Encounter (HOSPITAL_BASED_OUTPATIENT_CLINIC_OR_DEPARTMENT_OTHER): Payer: Self-pay | Admitting: Physical Therapy

## 2022-10-31 DIAGNOSIS — M6281 Muscle weakness (generalized): Secondary | ICD-10-CM | POA: Diagnosis not present

## 2022-10-31 DIAGNOSIS — M5459 Other low back pain: Secondary | ICD-10-CM | POA: Diagnosis not present

## 2022-10-31 DIAGNOSIS — M25552 Pain in left hip: Secondary | ICD-10-CM | POA: Insufficient documentation

## 2022-10-31 DIAGNOSIS — R2689 Other abnormalities of gait and mobility: Secondary | ICD-10-CM | POA: Diagnosis not present

## 2022-10-31 NOTE — Therapy (Unsigned)
[facility]    Patient Name: Karen Bonilla MRN: [id number] DOB:[birth year], 81 y.o., female Today's Date: 10/24/2022   PT End of Session - 10/24/22 1205     Visit Number 7    Number of Visits 13    Date for PT Re-Evaluation 11/07/22    Authorization Type [employer]    Authorization Time Period 09/26/22 to 11/07/22    PT Start Time 1145    PT Stop Time 1224    PT Time Calculation (min) 39 min    Activity Tolerance Patient tolerated treatment well    Behavior During Therapy [facility] for tasks assessed/performed                Past Medical History:  Diagnosis Date   Abnormal chest CT    Anemia    Anosmia    Aortic atherosclerosis (HCC)    Arthritis    Back pain of lumbar region with sciatica    Basal cell carcinoma 09/01/2002   right shin tx exc   BCC (basal cell carcinoma of skin) 06/20/2013   right forearm TX WITH BX   BCC (basal cell carcinoma of skin) 06/20/2013   right upper lip TX WITH BX   BCC (basal cell carcinoma of skin) 10/03/2016   mid upper back TX WITH BX   CAD (coronary artery disease)    Colon polyps    COPD (chronic obstructive pulmonary disease) (HCC)    DDD (degenerative disc disease), lumbar    Diverticulosis large intestine w/o perforation or abscess w/o bleeding    Emphysema, unspecified (HCC)    History of colonic polyps    Hypercholesterolemia    Hyperlipidemia    Inguinal hernia    Mucoid cyst of joint    SCC (squamous cell carcinoma) 01/23/2011   left forearm scc/ka  TX CX3 5FU CAUTERY   SCC (squamous cell carcinoma) 10/03/2016   right shin scc/ka TX CX3 5FU CAUTERY   SCC (squamous cell carcinoma) 07/29/2019   left forehead scc TX CX3 5FU CAUTERY   Scoliosis of lumbar spine, unspecified scoliosis type    Seasonal allergies    Senile purpura ([place])    Spinal stenosis    Spinal stenosis    Squamous cell carcinoma of skin 10/23/2005   left lower shin inf. bowens  TX CX3 5FU CAUTERY    Squamous cell carcinoma of skin 08/06/2020   in situ- right lower leg (CX35FU)   Squamous cell carcinoma of skin 08/06/2020   in situ- right lower leg- posterior lower (CX35FU)   Varicose veins of lower extremities with other complications 95/28/4132   Past Surgical History:  Procedure Laterality Date   ABDOMINAL HYSTERECTOMY     APPENDECTOMY     INGUINAL HERNIA REPAIR Bilateral 01/20/2022   Procedure: LAPAROSCOPIC BILATERAL INGUINAL HERNIA REPAIR WITH MESH;  Surgeon: Kinsinger, Arta Bruce, MD;  Location: [facility];  Service: General;  Laterality: Bilateral;   MASS EXCISION  01/11/2012   Procedure: MINOR EXCISION OF MASS;  Surgeon: Cammie Sickle., MD;  Location: [place];  Service: Orthopedics;  Laterality: Right;  excision mucoid cyst right index and dip joint debridement   Patient Active Problem List   Diagnosis Date Noted   Bilateral inguinal hernia 01/20/2022   Anosmia 02/02/2021   Degeneration of lumbar intervertebral disc 02/02/2021   Hardening of the aorta (main artery of the heart) ([place]) 02/02/2021   Hypercholesterolemia 02/02/2021   Other seasonal allergic rhinitis 02/02/2021   Other specified  disorders of bone density and structure, other site 02/02/2021   Personal history of colonic polyps 02/02/2021   Chronic obstructive pulmonary disease ([place]) 02/02/2021   Senile purpura ([place]) 02/02/2021   Spinal stenosis 02/02/2021   Abnormal findings on diagnostic imaging of other specified body structures 02/02/2021   Coronary artery calcification 03/19/2018   Ganglion of left wrist 03/07/2017   Varicose veins of lower extremities with other complications 94/76/5465    PCP: Yaakov Guthrie   REFERRING PROVIDER: Marvis Moeller, NP   REFERRING DIAG: M54.16 (ICD-10-CM) - Radiculopathy, lumbar region   Rationale for Evaluation and Treatment Rehabilitation  THERAPY DIAG:  Other low back pain  Pain in left hip  Muscle weakness (generalized)  Other  abnormalities of gait and mobility  ONSET DATE: 09/11/2022   SUBJECTIVE:                                                                                                                                                                                           SUBJECTIVE STATEMENT: The patient reports her pain has been about the same. She has good days and bad days. She has been doing water aerobics.   PERTINENT HISTORY:    PAIN:  Are you having pain? No no pain sitting down; when I stand it catches me across my L groin and something pulls down to my knee on the inside of my leg. Leg pain can get to 2-3/10 at worst when she first gets up , back pain can get to 3-4/10 at worst    PRECAUTIONS: None  WEIGHT BEARING RESTRICTIONS No  FALLS:  Has patient fallen in last 6 months? No  LIVING ENVIRONMENT: Lives with: lives alone Lives in: House/apartment Stairs: 6 STE B rails, no steps inside  Has following equipment at home: Single point cane  OCCUPATION: retired   PLOF: Independent, Independent with basic ADLs, Independent with gait, and Independent with transfers  PATIENT GOALS stop leg pain, "leg pain is my main concern, I don't think there will be anything that will stop the back pain"    OBJECTIVE:   DIAGNOSTIC FINDINGS:    PATIENT SURVEYS:  FOTO 53.3  SCREENING FOR RED FLAGS: Bowel or bladder incontinence: No Spinal tumors: No Cauda equina syndrome: No Compression fracture: No Abdominal aneurysm: No  COGNITION:  Overall cognitive status: Within functional limits for tasks assessed     SENSATION: Not tested  MUSCLE LENGTH: Quads severe limitation B, hip flexors severe limitation B, B hamstrings moderate limitation, B piriformis moderate limitation B  POSTURE: rounded shoulders, forward head, decreased lumbar lordosis, increased thoracic kyphosis, and flexed trunk   PALPATION: B  lumbar paraspinals tight and TTP, L glutes and piriformis TTP and tight   LUMBAR  ROM:   Active  A/PROM  eval  Flexion Mild limitation   Extension Severe limitation   Right lateral flexion Severe limitation   Left lateral flexion Severe limitation   Right rotation   Left rotation    (Blank rows = not tested)  L hip globally less mobile than the R PROM   LOWER EXTREMITY ROM:     Active  Right eval Left eval  Hip flexion    Hip extension    Hip abduction    Hip adduction    Hip internal rotation    Hip external rotation    Knee flexion    Knee extension    Ankle dorsiflexion    Ankle plantarflexion    Ankle inversion    Ankle eversion     (Blank rows = not tested)  LOWER EXTREMITY MMT:    MMT Right eval Left eval  Hip flexion 4 4  Hip extension 3- 3-  Hip abduction 3 4  Hip adduction    Hip internal rotation    Hip external rotation    Knee flexion 4 4  Knee extension 5 5  Ankle dorsiflexion 5 5  Ankle plantarflexion    Ankle inversion    Ankle eversion     (Blank rows = not tested)  LUMBAR SPECIAL TESTS:  RLE slightly longer than L did not attempt MET today   FUNCTIONAL TESTS:    GAIT: Distance walked: in clinic distances  Assistive device utilized: None Level of assistance: Complete Independence Comments: flexed at hips, Pelvis shifted to the R/shoulders to the L due to scoliosis, + trend     TODAY'S TREATMENT  11/8 Supine march x20  Supine bridge 2x10  LTR x20   Standing hip abduction 2x15   Extension 2x10  March 2x15  Squat 2x10   10/24 Supine march x20  Supine bridge 2x10  LTR x20   Standing chop x15 each direction  Pallof press x15 each way   Standing hip abduction 2x15   Extension 2x10  March 2x15  Squat 2x10         PATIENT EDUCATION:  Education details: exam findings, POC, HEP  Person educated: Patient Education method: Consulting civil engineer, Media planner, and Handouts Education comprehension: verbalized understanding and returned demonstration   HOME EXERCISE  PROGRAM: [id number]  ASSESSMENT:  CLINICAL IMPRESSION: The patient had increased pain into her groin with standing activity today. She even had pain in her groin with standing weight shifting. She will be going back to the MD at some point for a follow up. At [facility] point the patient has reached max benefit from skilled therapy. She had little response to manual therapy. She has a full exercise program which she can do with minimal pain.  She had an exacerbation of her groin during this episode when she let her leg fall off to the side with a clamshell. She is going to the gym for the pool. See below for goal specific progress.   OBJECTIVE IMPAIRMENTS Abnormal gait, decreased mobility, difficulty walking, decreased ROM, decreased strength, hypomobility, increased fascial restrictions, increased muscle spasms, impaired flexibility, postural dysfunction, and pain.   ACTIVITY LIMITATIONS standing, sleeping, transfers, dressing, and locomotion level  PARTICIPATION LIMITATIONS: cleaning, laundry, shopping, community activity, and yard work  PERSONAL FACTORS Age, Behavior pattern, Past/current experiences, and Time since onset of injury/illness/exacerbation are also affecting patient's functional outcome.   REHAB POTENTIAL: Good  CLINICAL  DECISION MAKING: Stable/uncomplicated  EVALUATION COMPLEXITY: Low   GOALS: Goals reviewed with patient? Yes  SHORT TERM GOALS: Target date: 10/17/2022  Will be compliant with appropriate progressive HEP  Baseline: Goal status: achieved 11/7  2.  Will demonstrate at least a 50% improvement in piriformis and HS flexibility B  Baseline:  Goal status: improved flexibility achieved 11/7  3.  LE pain to be no more than 4/10 at worst when standing for 60 minutes to cook  Baseline:  Goal status: can not stand 60 min yet 11/7  4.  Will have better understanding of posture and general ergonomics  Baseline:  Goal status: has a good understanding 11/7   LONG  TERM GOALS: Target date: 11/07/2022  MMT to improve by at least 1 grade in all weak groups  Baseline:  Goal status: Improved strength   2.  Low back and LE pain to be no more than 2/10 with extended standing for greater than 90 minutes  Baseline:  Goal status:  3.  Will be able to sleep for at least 4 hours without waking due to pain  Baseline:  Goal status: sleeping better most nights achieved   4.  Will be compliant with appropriate gym vs advanced HEP to maintain functional gains and prevent recurrence of pain  Baseline:  Goal status: going to the pool     PLAN: PT FREQUENCY: 2x/week  PT DURATION: 6 weeks  PLANNED INTERVENTIONS: Therapeutic exercises, Therapeutic activity, Neuromuscular re-education, Balance training, Gait training, Patient/Family education, Self Care, Aquatic Therapy, Manual therapy, and Re-evaluation.  PLAN FOR NEXT SESSION: core and hip strengthening, flexibility, hip ROM. Consider trying lumbar shifts to L to see if this helps pain, also consider shoe lift    Carolyne Littles PT DPT  10/24/2022, 1:07 PM   Referring diagnosis? M54.16  Treatment diagnosis? (if different than referring diagnosis) M54.59, M25.552, M62.81, R26.89 What was this (referring dx) caused by? _0  Surgery _1  Fall _2  Ongoing issue _3  Arthritis _4  Other: ____________  Laterality: _5  Rt _6  Lt _7  Both  Check all possible CPT codes:  *CHOOSE 10 OR LESS*    _8  97110 (Therapeutic Exercise)  _9  92507 (SLP Treatment)  _10  27035 (Neuro Re-ed)   _11  92526 (Swallowing Treatment)   _12  00938 (Gait Training)   _13  18299 (Cognitive Training, 1st 15 minutes) _14  97140 (Manual Therapy)   _15  97130 (Cognitive Training, each add'l 15 minutes)  _16  97164 (Re-evaluation)                              _17  Other, List CPT Code ____________  _18  37169 (Therapeutic Activities)     _19  67893 (Self Care)   _20  All codes above (97110 - 97535)  _21  97012 (Mechanical Traction)  _22  97014 (E-stim  Unattended)  _23  97032 (E-stim manual)  _24  97033 (Ionto)  _25  97035 (Ultrasound) _26  97750 (Physical Performance Training) _27  81017 (Aquatic Therapy) _28  97016 (Vasopneumatic Device) _29  51025 (Paraffin) _30  85277 (Contrast Bath) _31  97597 (Wound Care 1st 20 sq cm) _32  97598 (Wound Care each add'l 20 sq cm) _33  97760 (Orthotic Fabrication, Fitting, Training Initial) _34  [id number] (Prosthetic Management and Training Initial) _35  [id number] (Orthotic or Prosthetic Training/ Modification Subsequent)

## 2022-11-02 ENCOUNTER — Encounter (HOSPITAL_BASED_OUTPATIENT_CLINIC_OR_DEPARTMENT_OTHER): Payer: Medicare PPO | Admitting: Physical Therapy

## 2022-11-08 DIAGNOSIS — M5442 Lumbago with sciatica, left side: Secondary | ICD-10-CM | POA: Diagnosis not present

## 2022-12-06 DIAGNOSIS — M5416 Radiculopathy, lumbar region: Secondary | ICD-10-CM | POA: Diagnosis not present

## 2022-12-25 HISTORY — PX: COLONOSCOPY: SHX174

## 2023-01-01 ENCOUNTER — Other Ambulatory Visit: Payer: Self-pay | Admitting: Acute Care

## 2023-01-01 ENCOUNTER — Telehealth: Payer: Self-pay | Admitting: *Deleted

## 2023-01-01 DIAGNOSIS — F17210 Nicotine dependence, cigarettes, uncomplicated: Secondary | ICD-10-CM

## 2023-01-01 DIAGNOSIS — Z87891 Personal history of nicotine dependence: Secondary | ICD-10-CM

## 2023-01-01 NOTE — Telephone Encounter (Signed)
Spoke with pt and let her know that we have cancelled her appt for lung screening CT due to her current age of 31. PT verbalized understanding and will follow up with her PCP regarding any further follow up needed.

## 2023-01-02 ENCOUNTER — Inpatient Hospital Stay: Admission: RE | Admit: 2023-01-02 | Payer: Medicare PPO | Source: Ambulatory Visit

## 2023-01-08 DIAGNOSIS — M5416 Radiculopathy, lumbar region: Secondary | ICD-10-CM | POA: Diagnosis not present

## 2023-01-16 ENCOUNTER — Other Ambulatory Visit: Payer: Medicare PPO

## 2023-02-07 DIAGNOSIS — C44629 Squamous cell carcinoma of skin of left upper limb, including shoulder: Secondary | ICD-10-CM | POA: Diagnosis not present

## 2023-02-07 DIAGNOSIS — D485 Neoplasm of uncertain behavior of skin: Secondary | ICD-10-CM | POA: Diagnosis not present

## 2023-02-07 DIAGNOSIS — C44529 Squamous cell carcinoma of skin of other part of trunk: Secondary | ICD-10-CM | POA: Diagnosis not present

## 2023-03-09 ENCOUNTER — Ambulatory Visit (INDEPENDENT_AMBULATORY_CARE_PROVIDER_SITE_OTHER): Payer: Medicare PPO

## 2023-03-09 ENCOUNTER — Ambulatory Visit: Payer: Medicare PPO | Admitting: Podiatry

## 2023-03-09 DIAGNOSIS — M7661 Achilles tendinitis, right leg: Secondary | ICD-10-CM | POA: Diagnosis not present

## 2023-03-09 DIAGNOSIS — M722 Plantar fascial fibromatosis: Secondary | ICD-10-CM | POA: Diagnosis not present

## 2023-03-09 MED ORDER — MELOXICAM 15 MG PO TABS: 15.0000 mg | ORAL_TABLET | Freq: Every day | ORAL | 1 refills | Status: DC

## 2023-03-09 NOTE — Progress Notes (Signed)
Chief Complaint  Patient presents with   Flat Foot   Foot Pain    Patient came in today for right foot back of the heel pain, started 6 months ago, rate pain 2 out of 10, sharp pain, X-Ray done today     HPI: 82 y.o. female presenting today as a reestablish new patient for evaluation of pain to the back of the ankle along the Achilles tendon that is been going on for few months now.  Denies a history of injury.  Low-grade pain.  She says it is only intermittent with certain movements.  She has not done anything for treatment  Past Medical History:  Diagnosis Date   Abnormal chest CT    Anemia    Anosmia    Aortic atherosclerosis (HCC)    Arthritis    Back pain of lumbar region with sciatica    Basal cell carcinoma 09/01/2002   right shin tx exc   BCC (basal cell carcinoma of skin) 06/20/2013   right forearm TX WITH BX   BCC (basal cell carcinoma of skin) 06/20/2013   right upper lip TX WITH BX   BCC (basal cell carcinoma of skin) 10/03/2016   mid upper back TX WITH BX   CAD (coronary artery disease)    Colon polyps    COPD (chronic obstructive pulmonary disease) (HCC)    DDD (degenerative disc disease), lumbar    Diverticulosis large intestine w/o perforation or abscess w/o bleeding    Emphysema, unspecified (HCC)    History of colonic polyps    Hypercholesterolemia    Hyperlipidemia    Inguinal hernia    Mucoid cyst of joint    SCC (squamous cell carcinoma) 01/23/2011   left forearm scc/ka  TX CX3 5FU CAUTERY   SCC (squamous cell carcinoma) 10/03/2016   right shin scc/ka TX CX3 5FU CAUTERY   SCC (squamous cell carcinoma) 07/29/2019   left forehead scc TX CX3 5FU CAUTERY   Scoliosis of lumbar spine, unspecified scoliosis type    Seasonal allergies    Senile purpura ([place])    Spinal stenosis    Spinal stenosis    Squamous cell carcinoma of skin 10/23/2005   left lower shin inf. bowens  TX CX3 5FU CAUTERY   Squamous cell carcinoma of skin 08/06/2020   in situ- right  lower leg (CX35FU)   Squamous cell carcinoma of skin 08/06/2020   in situ- right lower leg- posterior lower (CX35FU)   Varicose veins of lower extremities with other complications [id number]    Past Surgical History:  Procedure Laterality Date   ABDOMINAL HYSTERECTOMY     APPENDECTOMY     INGUINAL HERNIA REPAIR Bilateral 01/20/2022   Procedure: LAPAROSCOPIC BILATERAL INGUINAL HERNIA REPAIR WITH MESH;  Surgeon: Kinsinger, Arta Bruce, MD;  Location: [facility];  Service: General;  Laterality: Bilateral;   MASS EXCISION  01/11/2012   Procedure: MINOR EXCISION OF MASS;  Surgeon: Cammie Sickle., MD;  Location: [place];  Service: Orthopedics;  Laterality: Right;  excision mucoid cyst right index and dip joint debridement    No Known Allergies   Physical Exam: General: The patient is alert and oriented x3 in no acute distress.  Dermatology: Skin is warm, dry and supple bilateral lower extremities. Negative for open lesions or macerations.  Vascular: Palpable pedal pulses bilaterally. No edema or erythema noted. Capillary refill within normal limits.  Neurological: Epicritic and protective threshold grossly intact bilaterally.   Musculoskeletal Exam: Pain on palpation  noted along the midportion of the Achilles tendon approximately 5 cm proximal to the insertion.  Range of motion within normal limits. Muscle strength 5/5 in all muscle groups bilateral lower extremities.  Radiographic Exam RT foot 03/09/2023:  Posterior calcaneal spur noted to the respective calcaneus on lateral view. No fracture or dislocation noted. Normal osseous mineralization noted.   Plantar heel spur noted  Assessment: 1. Achilles tendinitis right  -Patient evaluated.  X-rays reviewed -Physical therapy was offered for the patient but ultimately she declined.  If she would like to pursue physical therapy she says that she will contact the office and we will send [facility] to [facility] for Achilles  tendinitis right -Prescription for meloxicam 15 mg daily as needed -Recommend good supportive shoes and sneakers advised against going barefoot -Return to clinic as needed   Edrick Kins, DPM [facility]  Dr. Edrick Kins, DPM    [address], [place] [address]                Office [phone]  Fax [phone]

## 2023-03-14 NOTE — Progress Notes (Unsigned)
Cardiology Office Note:    Date:  03/15/2023   ID:  Karen Bonilla, DOB 03-31-1941, MRN [id number]  PCP:  Lurline Del, DO   [facility] Cardiologist:  Larae Grooms, MD     Referring MD: No ref. provider found   Chief Complaint: follow-up CAD  History of Present Illness:    Karen Bonilla is a very pleasant 82 y.o. female with a hx of tobacco abuse, emphysema, carotid artery disease, aortic atherosclerosis, and coronary calcifications seen on CT.   She underwent CT lung cancer screening for history of tobacco abuse which revealed coronary and aortic valve atherosclerotic calcifications in February 2019.  She had been seen by Dr. Irish Lack years prior.  She underwent stress testing in January 2011.  At that time there was no evidence of ischemia.  Ejection fraction was 75%.  Exercise capacity was 10 METS.  Between 2011 and 2019 she lost 20 pounds.  She also had carotid artery testing which revealed minimal carotid artery disease bilaterally in 2010.  She also had abdominal aorta screening due to tobacco history with no evidence of AAA.  Last cardiology clinic visit was 03/13/22 with Dr. Irish Lack. He reports January 2023 lung cancer screening showed "heart size normal, no significant pericardial fluid, thickening, or pericardial calcification.  Aortic atherosclerosis as well as atherosclerosis of the great vessels of the mediastinum and the coronary arteries including calcified atherosclerotic plaque in the left anterior descending, left circumflex and right coronary arteries.  Mild calcification of the aortic valve.  Aortic atherosclerosis." She continued to be active with water aerobics.  Only feeling poorly when she had bronchitis.  No chest pain, palpitations, orthopnea, PND.  She continues to to smoke. She tolerated recent hernia surgery well.  Given her lack of symptoms, he did not recommend pursuing any stress testing at that time.  She had stopped aspirin due to easy  bruising.  Advised her to restart at least 3 times per week.  He recommended whole food, plant-based, high-fiber diet.  LDL cholesterol was 78.  She was advised to continue pravastatin and encouraged to stop smoking. One year follow-up was recommended.  Today, she is here alone for follow-up. Reports BP has been elevated for the past few days. Admits she does not eat a lot of fruits and vegetables and does eat a lot of bacon and potato chips. Has always been a meat eater. Has not been able to do water aerobics for the past week due to heater broken in the pool.  Working on improving her diet, bought more fruits and vegetables at the store this week. Has chronic shortness of breath that she feels is 2/2 smoking, feels it is stable. She denies chest pain, lower extremity edema, fatigue, palpitations, melena, hematuria, hemoptysis, diaphoresis, weakness, presyncope, syncope, orthopnea, and PND. Wonders why her lower extremities have always been reddish in color. Her sister who is 23 years younger recently had endarterectomy for carotid occlusion.   Past Medical History:  Diagnosis Date   Abnormal chest CT    Anemia    Anosmia    Aortic atherosclerosis (HCC)    Arthritis    Back pain of lumbar region with sciatica    Basal cell carcinoma 09/01/2002   right shin tx exc   BCC (basal cell carcinoma of skin) 06/20/2013   right forearm TX WITH BX   BCC (basal cell carcinoma of skin) 06/20/2013   right upper lip TX WITH BX   BCC (basal cell carcinoma of  skin) 10/03/2016   mid upper back TX WITH BX   CAD (coronary artery disease)    Colon polyps    COPD (chronic obstructive pulmonary disease) (HCC)    DDD (degenerative disc disease), lumbar    Diverticulosis large intestine w/o perforation or abscess w/o bleeding    Emphysema, unspecified (HCC)    History of colonic polyps    Hypercholesterolemia    Hyperlipidemia    Inguinal hernia    Mucoid cyst of joint    SCC (squamous cell carcinoma)  01/23/2011   left forearm scc/ka  TX CX3 5FU CAUTERY   SCC (squamous cell carcinoma) 10/03/2016   right shin scc/ka TX CX3 5FU CAUTERY   SCC (squamous cell carcinoma) 07/29/2019   left forehead scc TX CX3 5FU CAUTERY   Scoliosis of lumbar spine, unspecified scoliosis type    Seasonal allergies    Senile purpura ([place])    Spinal stenosis    Spinal stenosis    Squamous cell carcinoma of skin 10/23/2005   left lower shin inf. bowens  TX CX3 5FU CAUTERY   Squamous cell carcinoma of skin 08/06/2020   in situ- right lower leg ([id number])   Squamous cell carcinoma of skin 08/06/2020   in situ- right lower leg- posterior lower ([id number])   Varicose veins of lower extremities with other complications [id number]    Past Surgical History:  Procedure Laterality Date   ABDOMINAL HYSTERECTOMY     APPENDECTOMY     INGUINAL HERNIA REPAIR Bilateral 01/20/2022   Procedure: LAPAROSCOPIC BILATERAL INGUINAL HERNIA REPAIR WITH MESH;  Surgeon: Kinsinger, Arta Bruce, MD;  Location: [facility];  Service: General;  Laterality: Bilateral;   MASS EXCISION  01/11/2012   Procedure: MINOR EXCISION OF MASS;  Surgeon: Cammie Sickle., MD;  Location: [facility];  Service: Orthopedics;  Laterality: Right;  excision mucoid cyst right index and dip joint debridement    Current Medications: Current Meds  Medication Sig   albuterol (PROVENTIL HFA;VENTOLIN HFA) 108 (90 BASE) MCG/ACT inhaler Inhale into the lungs every 6 (six) hours as needed for wheezing or shortness of breath.   alendronate (FOSAMAX) 70 MG tablet Take 70 mg by mouth once a week.   calcium carbonate (TUMS - DOSED IN MG ELEMENTAL CALCIUM) 500 MG chewable tablet Chew 1 tablet by mouth daily as needed.   cholecalciferol (VITAMIN D) 25 MCG (1000 UNIT) tablet Take 1,000 Units by mouth at bedtime.   meloxicam (MOBIC) 15 MG tablet Take 1 tablet (15 mg total) by mouth daily.   Polyethyl Glycol-Propyl Glycol (SYSTANE) 0.4-0.3 % SOLN Place 1-2 drops  into both eyes 2 (two) times daily as needed (dry/irritated eyes.).   pravastatin (PRAVACHOL) 20 MG tablet Take 20 mg by mouth at bedtime.   traMADol (ULTRAM) 50 MG tablet Take 1 tablet (50 mg total) by mouth every 6 (six) hours as needed for moderate pain or severe pain.   [DISCONTINUED] ibuprofen (ADVIL,MOTRIN) 200 MG tablet Take 1 tablet (200 mg total) by mouth every 4 (four) hours as needed for pain.   [DISCONTINUED] naproxen sodium (ALEVE) 220 MG tablet Take 220 mg by mouth daily as needed (pain.).     Allergies:   Patient has no known allergies.   Social History   Socioeconomic History   Marital status: Divorced    Spouse name: Not on file   Number of children: Not on file   Years of education: Not on file   Highest education level: Not on file  Occupational  History   Not on file  Tobacco Use   Smoking status: Every Day    Packs/day: 1.00    Years: 60.00    Additional pack years: 0.00    Total pack years: 60.00    Types: Cigarettes    Start date: 1958   Smokeless tobacco: Never  Vaping Use   Vaping Use: Never used  Substance and Sexual Activity   Alcohol use: Yes    Alcohol/week: 5.0 standard drinks of alcohol    Types: 5 Glasses of wine per week    Comment: WINE NIGHTLY WITH DINNER   Drug use: No   Sexual activity: Not on file  Other Topics Concern   Not on file  Social History Narrative   Not on file   Social Determinants of Health   Financial Resource Strain: Not on file  Food Insecurity: Not on file  Transportation Needs: Not on file  Physical Activity: Not on file  Stress: Not on file  Social Connections: Not on file     Family History: The patient's family history includes Hypertension in her father and mother; Other in her father.  ROS:   Please see the history of present illness.   All other systems reviewed and are negative.  Labs/Other Studies Reviewed:    The following studies were reviewed today:  Stress Test 12/2009 Rare PVC, PACs  during exercise No ischemia, normal LV function  Recent Labs: No results found for requested labs within last 365 days.  Recent Lipid Panel No results found for: "CHOL", "TRIG", "HDL", "CHOLHDL", "VLDL", "LDLCALC", "LDLDIRECT"   Risk Assessment/Calculations:       Physical Exam:    VS:  BP (!) 142/64   Pulse 64   Ht 5\' 6"  (1.676 m)   Wt 145 lb 12.8 oz (66.1 kg)   SpO2 98%   BMI 23.53 kg/m     Wt Readings from Last 3 Encounters:  03/15/23 145 lb 12.8 oz (66.1 kg)  03/13/22 150 lb (68 kg)  01/20/22 148 lb (67.1 kg)     GEN:  Well nourished, well developed in no acute distress HEENT: Normal NECK: No JVD; No carotid bruits CARDIAC: RRR, no murmurs, rubs, gallops RESPIRATORY:  Clear to auscultation without rales, wheezing or rhonchi  ABDOMEN: Soft, non-tender, non-distended MUSCULOSKELETAL:  No edema; No deformity. 2+ pedal pulses, equal bilaterally SKIN: Warm and dry NEUROLOGIC:  Alert and oriented x 3 PSYCHIATRIC:  Normal affect   EKG:  EKG is ordered today.  The ekg ordered today demonstrates sinus rhythm at 64 bpm with PAC  HYPERTENSION CONTROL Vitals:   03/15/23 1339 03/15/23 1419  BP: (!) 142/84 (!) 142/64    The patient's blood pressure is elevated above target today.  In order to address the patient's elevated BP: Blood pressure will be monitored at home to determine if medication changes need to be made.      Diagnoses:    1. Coronary artery calcification   2. Hyperlipidemia LDL goal <70   3. Aortic atherosclerosis (HCC)   4. Elevated blood pressure reading in office without diagnosis of hypertension   5. Tobacco abuse   6. Encounter for screening for vascular disease    Assessment and Plan:     Coronary artery calcification/Aortic atherosclerosis: Atherosclerosis of the great vessels of the mediastinum, coronary arteries, and aorta including calcified atherosclerotic plaque in the LAD, LCx, and RCA on lung cancer screening CT 2021 in 2023. She  denies chest pain, dyspnea, or other symptoms concerning  for angina.  Given lack of symptoms, we have not pursued stress testing. She remains asymptomatic, therefore no indication for further ischemic evaluation at this time. Has been recommended to take aspirin 81 mg daily but did not tolerate due to bruising. Continue pravastatin. Advised her to notify us if she develops concerning symptoms prior to next office visit.   Elevated BP: BP is elevated today. Admits home BP readings have been elevated over the past few ys.  Is working on reducing intake of salt and increasing intake of fruits and vegetables. DASH diet given. Encouraged her to monitor BP for 2 weeks and call back to report. Plan to initiate anti-hypertensive therapy if BP remains > 140/80.   Hyperlipidemia goal < 70:  LDL 69 on 08/08/22.  Encouraged mostly plant-based, whole food diet and regular physical activity. Continue pravastatin. Management by PCP.  Tobacco abuse: Continues to smoke. Complete cessation advised.  Vascular screening: She is concerned about discoloration of lower extremities and sisters recent endarterectomy.  We will order vascular screening of carotids, AAA, and lower extremities due to long history of tobacco use and aortic and coronary atherosclerosis noted on CT.   Disposition: 1 year with Dr. Irish Lack   Medication Adjustments/Labs and Tests Ordered: Current medicines are reviewed at length with the patient today.  Concerns regarding medicines are outlined above.  Orders Placed This Encounter  Procedures   EKG 12-Lead   VAS US VASCUSCREEN   No orders of the defined types were placed in this encounter.   Patient Instructions  Medication Instructions:  Your physician recommends that you continue on your current medications as directed. Please refer to the Current Medication list given to you today.  *If you need a refill on your cardiac medications before your next appointment, please call your  pharmacy*   Lab Work: None ordered  If you have labs (blood work) drawn today and your tests are completely normal, you will receive your results only by: [place] (if you have MyChart) OR A paper copy in the mail If you have any lab test that is abnormal or we need to change your treatment, we will call you to review the results.   Testing/Procedures: Your physician recommends that you have the Vascuscreen, which includes carotid ultrasound, lower extremity dopplers, and abdominal ultrasound.   Follow-Up: At [facility], you and your health needs are our priority.  As part of our continuing mission to provide you with exceptional heart care, we have created designated Provider Care Teams.  These Care Teams include your primary Cardiologist (physician) and Advanced Practice Providers (APPs -  Physician Assistants and Nurse Practitioners) who all work together to provide you with the care you need, when you need it.  We recommend signing up for the patient portal called "MyChart".  Sign up information is provided on this After Visit Summary.  MyChart is used to connect with patients for Virtual Visits (Telemedicine).  Patients are able to view lab/test results, encounter notes, upcoming appointments, etc.  Non-urgent messages can be sent to your provider as well.   To learn more about what you can do with MyChart, go to [email or web address].    Your next appointment:   1 year(s)  Provider:   Larae Grooms, MD     Other Instructions  Your physician has requested that you regularly monitor and record your blood pressure readings at home. Please use the same machine at the same time of day to check your readings and record them to  bring to your follow-up visit.  YOUR PRESSURES SHOULD BE UNDER 14-/80   Please monitor blood pressures and keep a log of your readings.    Make sure to check 2 hours after your medications.    AVOID these things for 30 minutes before  checking your blood pressure: No Drinking caffeine. No Drinking alcohol. No Eating. No Smoking. No Exercising.   Five minutes before checking your blood pressure: Pee. Sit in a dining chair. Avoid sitting in a soft couch or armchair. Be quiet. Do not talk   DASH Eating Plan [place] stands for Dietary Approaches to Stop Hypertension. The DASH eating plan is a healthy eating plan that has been shown to: Reduce high blood pressure (hypertension). Reduce your risk for type 2 diabetes, heart disease, and stroke. Help with weight loss. What are tips for following this plan? Reading food labels Check food labels for the amount of salt (sodium) per serving. Choose foods with less than 5 percent of the Daily Value of sodium. Generally, foods with less than 300 milligrams (mg) of sodium per serving fit into this eating plan. To find whole grains, look for the word "whole" as the first word in the ingredient list. Shopping Buy products labeled as "low-sodium" or "no salt added." Buy fresh foods. Avoid canned foods and pre-made or frozen meals. Cooking Avoid adding salt when cooking. Use salt-free seasonings or herbs instead of table salt or sea salt. Check with your health care provider or pharmacist before using salt substitutes. Do not fry foods. Cook foods using healthy methods such as baking, boiling, grilling, roasting, and broiling instead. Cook with heart-healthy oils, such as olive, canola, avocado, soybean, or sunflower oil. Meal planning  Eat a balanced diet that includes: 4 or more servings of fruits and 4 or more servings of vegetables each day. Try to fill one-half of your plate with fruits and vegetables. 6-8 servings of whole grains each day. Less than 6 oz (170 g) of lean meat, poultry, or fish each day. A 3-oz (85-g) serving of meat is about the same size as a deck of cards. One egg equals 1 oz (28 g). 2-3 servings of low-fat dairy each day. One serving is 1 cup (237 mL). 1  serving of nuts, seeds, or beans 5 times each week. 2-3 servings of heart-healthy fats. Healthy fats called omega-3 fatty acids are found in foods such as walnuts, flaxseeds, fortified milks, and eggs. These fats are also found in cold-water fish, such as sardines, salmon, and mackerel. Limit how much you eat of: Canned or prepackaged foods. Food that is high in trans fat, such as some fried foods. Food that is high in saturated fat, such as fatty meat. Desserts and other sweets, sugary drinks, and other foods with added sugar. Full-fat dairy products. Do not salt foods before eating. Do not eat more than 4 egg yolks a week. Try to eat at least 2 vegetarian meals a week. Eat more home-cooked food and less restaurant, buffet, and fast food. Lifestyle When eating at a restaurant, ask that your food be prepared with less salt or no salt, if possible. If you drink alcohol: Limit how much you use to: 0-1 drink a day for women who are not pregnant. 0-2 drinks a day for men. Be aware of how much alcohol is in your drink. In the [place] one drink equals one 12 oz bottle of beer (355 mL), one 5 oz glass of wine (148 mL), or one 1 oz glass of  hard liquor (44 mL). General information Avoid eating more than 2,300 mg of salt a day. If you have hypertension, you may need to reduce your sodium intake to 1,500 mg a day. Work with your health care provider to maintain a healthy body weight or to lose weight. Ask what an ideal weight is for you. Get at least 30 minutes of exercise that causes your heart to beat faster (aerobic exercise) most days of the week. Activities may include walking, swimming, or biking. Work with your health care provider or dietitian to adjust your eating plan to your individual calorie needs. What foods should I eat? Fruits All fresh, dried, or frozen fruit. Canned fruit in natural juice (without added sugar). Vegetables Fresh or frozen vegetables (raw, steamed, roasted, or  grilled). Low-sodium or reduced-sodium tomato and vegetable juice. Low-sodium or reduced-sodium tomato sauce and tomato paste. Low-sodium or reduced-sodium canned vegetables. Grains Whole-grain or whole-wheat bread. Whole-grain or whole-wheat pasta. Brown rice. [place]. Bulgur. Whole-grain and low-sodium cereals. Pita bread. Low-fat, low-sodium crackers. Whole-wheat flour tortillas. Meats and other proteins Skinless chicken or [place]. Ground chicken or [place]. Pork with fat trimmed off. Fish and seafood. Egg whites. Dried beans, peas, or lentils. Unsalted nuts, nut butters, and seeds. Unsalted canned beans. Lean cuts of beef with fat trimmed off. Low-sodium, lean precooked or cured meat, such as sausages or meat loaves. Dairy Low-fat (1%) or fat-free (skim) milk. Reduced-fat, low-fat, or fat-free cheeses. Nonfat, low-sodium ricotta or cottage cheese. Low-fat or nonfat yogurt. Low-fat, low-sodium cheese. Fats and oils Soft margarine without trans fats. Vegetable oil. Reduced-fat, low-fat, or light mayonnaise and salad dressings (reduced-sodium). Canola, safflower, olive, avocado, soybean, and sunflower oils. Avocado. Seasonings and condiments Herbs. Spices. Seasoning mixes without salt. Other foods Unsalted popcorn and pretzels. Fat-free sweets. The items listed above may not be a complete list of foods and beverages you can eat. Contact a dietitian for more information. What foods should I avoid? Fruits Canned fruit in a light or heavy syrup. Fried fruit. Fruit in cream or butter sauce. Vegetables Creamed or fried vegetables. Vegetables in a cheese sauce. Regular canned vegetables (not low-sodium or reduced-sodium). Regular canned tomato sauce and paste (not low-sodium or reduced-sodium). Regular tomato and vegetable juice (not low-sodium or reduced-sodium). Angie Fava. Olives. Grains Baked goods made with fat, such as croissants, muffins, or some breads. Dry pasta or rice meal packs. Meats  and other proteins Fatty cuts of meat. Ribs. Fried meat. Berniece Salines. Bologna, salami, and other precooked or cured meats, such as sausages or meat loaves. Fat from the back of a pig (fatback). Bratwurst. Salted nuts and seeds. Canned beans with added salt. Canned or smoked fish. Whole eggs or egg yolks. Chicken or [place] with skin. Dairy Whole or 2% milk, cream, and half-and-half. Whole or full-fat cream cheese. Whole-fat or sweetened yogurt. Full-fat cheese. Nondairy creamers. Whipped toppings. Processed cheese and cheese spreads. Fats and oils Butter. Stick margarine. Lard. Shortening. Ghee. Bacon fat. Tropical oils, such as coconut, palm kernel, or palm oil. Seasonings and condiments Onion salt, garlic salt, seasoned salt, table salt, and sea salt. Worcestershire sauce. Tartar sauce. Barbecue sauce. Teriyaki sauce. Soy sauce, including reduced-sodium. Steak sauce. Canned and packaged gravies. Fish sauce. Oyster sauce. Cocktail sauce. Store-bought horseradish. Ketchup. Mustard. Meat flavorings and tenderizers. Bouillon cubes. Hot sauces. Pre-made or packaged marinades. Pre-made or packaged taco seasonings. Relishes. Regular salad dressings. Other foods Salted popcorn and pretzels. The items listed above may not be a complete list of foods and beverages you  should avoid. Contact a dietitian for more information. Where to find more information National Heart, Lung, and Blood Institute: https://wilson-eaton.com/ American Heart Association: www.heart.org Academy of Nutrition and Dietetics: www.eatright.[place]: www.kidney.org Summary The DASH eating plan is a healthy eating plan that has been shown to reduce high blood pressure (hypertension). It may also reduce your risk for type 2 diabetes, heart disease, and stroke. When on the DASH eating plan, aim to eat more fresh fruits and vegetables, whole grains, lean proteins, low-fat dairy, and heart-healthy fats. With the DASH eating plan, you  should limit salt (sodium) intake to 2,300 mg a day. If you have hypertension, you may need to reduce your sodium intake to 1,500 mg a day. Work with your health care provider or dietitian to adjust your eating plan to your individual calorie needs. This information is not intended to replace advice given to you by your health care provider. Make sure you discuss any questions you have with your health care provider. Document Revised: 11/14/2019 Document Reviewed: 11/14/2019 Elsevier Patient Education  [place] refers to food and lifestyle choices that are based on the traditions of countries located on the [employer]. It focuses on eating more fruits, vegetables, whole grains, beans, nuts, seeds, and heart-healthy fats, and eating less dairy, meat, eggs, and processed foods with added sugar, salt, and fat. This way of eating has been shown to help prevent certain conditions and improve outcomes for people who have chronic diseases, like kidney disease and heart disease. What are tips for following this plan? Reading food labels Check the serving size of packaged foods. For foods such as rice and pasta, the serving size refers to the amount of cooked product, not dry. Check the total fat in packaged foods. Avoid foods that have saturated fat or trans fats. Check the ingredient list for added sugars, such as corn syrup. Shopping  Buy a variety of foods that offer a balanced diet, including: Fresh fruits and vegetables (produce). Grains, beans, nuts, and seeds. Some of these may be available in unpackaged forms or large amounts (in bulk). Fresh seafood. Poultry and eggs. Low-fat dairy products. Buy whole ingredients instead of prepackaged foods. Buy fresh fruits and vegetables in-season from local farmers markets. Buy plain frozen fruits and vegetables. If you do not have access to quality fresh seafood, buy precooked frozen shrimp or  canned fish, such as tuna, salmon, or sardines. Stock your pantry so you always have certain foods on hand, such as olive oil, canned tuna, canned tomatoes, rice, pasta, and beans. Cooking Cook foods with extra-virgin olive oil instead of using butter or other vegetable oils. Have meat as a side dish, and have vegetables or grains as your main dish. This means having meat in small portions or adding small amounts of meat to foods like pasta or stew. Use beans or vegetables instead of meat in common dishes like chili or lasagna. Experiment with different cooking methods. Try roasting, broiling, steaming, and sauting vegetables. Add frozen vegetables to soups, stews, pasta, or rice. Add nuts or seeds for added healthy fats and plant protein at each meal. You can add these to yogurt, salads, or vegetable dishes. Marinate fish or vegetables using olive oil, lemon juice, garlic, and fresh herbs. Meal planning Plan to eat one vegetarian meal one day each week. Try to work up to two vegetarian meals, if possible. Eat seafood two or more times a week. Have healthy snacks  readily available, such as: Vegetable sticks with hummus. Greek yogurt. Fruit and nut trail mix. Eat balanced meals throughout the week. This includes: Fruit: 2-3 servings a day. Vegetables: 4-5 servings a day. Low-fat dairy: 2 servings a day. Fish, poultry, or lean meat: 1 serving a day. Beans and legumes: 2 or more servings a week. Nuts and seeds: 1-2 servings a day. Whole grains: 6-8 servings a day. Extra-virgin olive oil: 3-4 servings a day. Limit red meat and sweets to only a few servings a month. Lifestyle  Cook and eat meals together with your family, when possible. Drink enough fluid to keep your urine pale yellow. Be physically active every day. This includes: Aerobic exercise like running or swimming. Leisure activities like gardening, walking, or housework. Get 7-8 hours of sleep each night. If recommended by  your health care provider, drink red wine in moderation. This means 1 glass a day for nonpregnant women and 2 glasses a day for men. A glass of wine equals 5 oz (150 mL). What foods should I eat? Fruits Apples. Apricots. Avocado. Berries. Bananas. Cherries. Dates. Figs. Grapes. Lemons. Melon. Oranges. Peaches. Plums. Pomegranate. Vegetables Artichokes. Beets. Broccoli. Cabbage. Carrots. Eggplant. Green beans. Chard. Kale. Spinach. Onions. Leeks. Peas. Squash. Tomatoes. Peppers. Radishes. Grains Whole-grain pasta. Brown rice. Bulgur wheat. Polenta. Couscous. Whole-wheat bread. [place]. Meats and other proteins Beans. Almonds. Sunflower seeds. Pine nuts. Peanuts. [place]. Salmon. Scallops. Shrimp. [place]. Tilapia. Clams. Oysters. Eggs. Poultry without skin. Dairy Low-fat milk. Cheese. Greek yogurt. Fats and oils Extra-virgin olive oil. Avocado oil. Grapeseed oil. Beverages Water. Red wine. Herbal tea. Sweets and desserts Greek yogurt with honey. Baked apples. Poached pears. Trail mix. Seasonings and condiments Basil. Cilantro. Coriander. Cumin. Mint. Parsley. Sage. Rosemary. Tarragon. Garlic. Oregano. Thyme. Pepper. Balsamic vinegar. Tahini. Hummus. Tomato sauce. Olives. Mushrooms. The items listed above may not be a complete list of foods and beverages you can eat. Contact a dietitian for more information. What foods should I limit? This is a list of foods that should be eaten rarely or only on special occasions. Fruits Fruit canned in syrup. Vegetables Deep-fried potatoes (french fries). Grains Prepackaged pasta or rice dishes. Prepackaged cereal with added sugar. Prepackaged snacks with added sugar. Meats and other proteins Beef. Pork. Lamb. Poultry with skin. Hot dogs. Berniece Salines. Dairy Ice cream. Sour cream. Whole milk. Fats and oils Butter. Canola oil. Vegetable oil. Beef fat (tallow). Lard. Beverages Juice. Sugar-sweetened soft drinks. Beer. Liquor and spirits. Sweets and  desserts Cookies. Cakes. Pies. Candy. Seasonings and condiments Mayonnaise. Pre-made sauces and marinades. The items listed above may not be a complete list of foods and beverages you should limit. Contact a dietitian for more information. Summary The Mediterranean diet includes both food and lifestyle choices. Eat a variety of fresh fruits and vegetables, beans, nuts, seeds, and whole grains. Limit the amount of red meat and sweets that you eat. If recommended by your health care provider, drink red wine in moderation. This means 1 glass a day for nonpregnant women and 2 glasses a day for men. A glass of wine equals 5 oz (150 mL). This information is not intended to replace advice given to you by your health care provider. Make sure you discuss any questions you have with your health care provider. Document Revised: 01/16/2020 Document Reviewed: 11/13/2019 Elsevier Patient Education  2023 [place], [place], [place]  03/15/2023 3:02 PM    [place]

## 2023-03-15 ENCOUNTER — Ambulatory Visit: Payer: Medicare PPO | Attending: Nurse Practitioner | Admitting: Nurse Practitioner

## 2023-03-15 ENCOUNTER — Encounter: Payer: Self-pay | Admitting: Nurse Practitioner

## 2023-03-15 VITALS — BP 142/64 | HR 64 | Ht 66.0 in | Wt 145.8 lb

## 2023-03-15 DIAGNOSIS — I2584 Coronary atherosclerosis due to calcified coronary lesion: Secondary | ICD-10-CM | POA: Diagnosis not present

## 2023-03-15 DIAGNOSIS — Z720 Tobacco use: Secondary | ICD-10-CM

## 2023-03-15 DIAGNOSIS — Z136 Encounter for screening for cardiovascular disorders: Secondary | ICD-10-CM

## 2023-03-15 DIAGNOSIS — I700 Atherosclerosis of aorta: Secondary | ICD-10-CM | POA: Diagnosis not present

## 2023-03-15 DIAGNOSIS — E785 Hyperlipidemia, unspecified: Secondary | ICD-10-CM

## 2023-03-15 DIAGNOSIS — I2510 Atherosclerotic heart disease of native coronary artery without angina pectoris: Secondary | ICD-10-CM

## 2023-03-15 DIAGNOSIS — R030 Elevated blood-pressure reading, without diagnosis of hypertension: Secondary | ICD-10-CM

## 2023-03-15 NOTE — Patient Instructions (Addendum)
Medication Instructions:  Your physician recommends that you continue on your current medications as directed. Please refer to the Current Medication list given to you today.  *If you need a refill on your cardiac medications before your next appointment, please call your pharmacy*   Lab Work: None ordered  If you have labs (blood work) drawn today and your tests are completely normal, you will receive your results only by: [place] (if you have MyChart) OR A paper copy in the mail If you have any lab test that is abnormal or we need to change your treatment, we will call you to review the results.   Testing/Procedures: Your physician recommends that you have the Vascuscreen, which includes carotid ultrasound, lower extremity dopplers, and abdominal ultrasound.   Follow-Up: At [facility], you and your health needs are our priority.  As part of our continuing mission to provide you with exceptional heart care, we have created designated Provider Care Teams.  These Care Teams include your primary Cardiologist (physician) and Advanced Practice Providers (APPs -  Physician Assistants and Nurse Practitioners) who all work together to provide you with the care you need, when you need it.  We recommend signing up for the patient portal called "MyChart".  Sign up information is provided on this After Visit Summary.  MyChart is used to connect with patients for Virtual Visits (Telemedicine).  Patients are able to view lab/test results, encounter notes, upcoming appointments, etc.  Non-urgent messages can be sent to your provider as well.   To learn more about what you can do with MyChart, go to [email or web address].    Your next appointment:   1 year(s)  Provider:   Larae Grooms, MD     Other Instructions  Your physician has requested that you regularly monitor and record your blood pressure readings at home. Please use the same machine at the same time of day to  check your readings and record them to bring to your follow-up visit.  YOUR PRESSURES SHOULD BE UNDER 14-/80   Please monitor blood pressures and keep a log of your readings.    Make sure to check 2 hours after your medications.    AVOID these things for 30 minutes before checking your blood pressure: No Drinking caffeine. No Drinking alcohol. No Eating. No Smoking. No Exercising.   Five minutes before checking your blood pressure: Pee. Sit in a dining chair. Avoid sitting in a soft couch or armchair. Be quiet. Do not talk   DASH Eating Plan [place] stands for Dietary Approaches to Stop Hypertension. The DASH eating plan is a healthy eating plan that has been shown to: Reduce high blood pressure (hypertension). Reduce your risk for type 2 diabetes, heart disease, and stroke. Help with weight loss. What are tips for following this plan? Reading food labels Check food labels for the amount of salt (sodium) per serving. Choose foods with less than 5 percent of the Daily Value of sodium. Generally, foods with less than 300 milligrams (mg) of sodium per serving fit into this eating plan. To find whole grains, look for the word "whole" as the first word in the ingredient list. Shopping Buy products labeled as "low-sodium" or "no salt added." Buy fresh foods. Avoid canned foods and pre-made or frozen meals. Cooking Avoid adding salt when cooking. Use salt-free seasonings or herbs instead of table salt or sea salt. Check with your health care provider or pharmacist before using salt substitutes. Do not fry foods. Cook foods using  healthy methods such as baking, boiling, grilling, roasting, and broiling instead. Cook with heart-healthy oils, such as olive, canola, avocado, soybean, or sunflower oil. Meal planning  Eat a balanced diet that includes: 4 or more servings of fruits and 4 or more servings of vegetables each day. Try to fill one-half of your plate with fruits and vegetables. 6-8  servings of whole grains each day. Less than 6 oz (170 g) of lean meat, poultry, or fish each day. A 3-oz (85-g) serving of meat is about the same size as a deck of cards. One egg equals 1 oz (28 g). 2-3 servings of low-fat dairy each day. One serving is 1 cup (237 mL). 1 serving of nuts, seeds, or beans 5 times each week. 2-3 servings of heart-healthy fats. Healthy fats called omega-3 fatty acids are found in foods such as walnuts, flaxseeds, fortified milks, and eggs. These fats are also found in cold-water fish, such as sardines, salmon, and mackerel. Limit how much you eat of: Canned or prepackaged foods. Food that is high in trans fat, such as some fried foods. Food that is high in saturated fat, such as fatty meat. Desserts and other sweets, sugary drinks, and other foods with added sugar. Full-fat dairy products. Do not salt foods before eating. Do not eat more than 4 egg yolks a week. Try to eat at least 2 vegetarian meals a week. Eat more home-cooked food and less restaurant, buffet, and fast food. Lifestyle When eating at a restaurant, ask that your food be prepared with less salt or no salt, if possible. If you drink alcohol: Limit how much you use to: 0-1 drink a day for women who are not pregnant. 0-2 drinks a day for men. Be aware of how much alcohol is in your drink. In the [place] one drink equals one 12 oz bottle of beer (355 mL), one 5 oz glass of wine (148 mL), or one 1 oz glass of hard liquor (44 mL). General information Avoid eating more than 2,300 mg of salt a day. If you have hypertension, you may need to reduce your sodium intake to 1,500 mg a day. Work with your health care provider to maintain a healthy body weight or to lose weight. Ask what an ideal weight is for you. Get at least 30 minutes of exercise that causes your heart to beat faster (aerobic exercise) most days of the week. Activities may include walking, swimming, or biking. Work with your health care  provider or dietitian to adjust your eating plan to your individual calorie needs. What foods should I eat? Fruits All fresh, dried, or frozen fruit. Canned fruit in natural juice (without added sugar). Vegetables Fresh or frozen vegetables (raw, steamed, roasted, or grilled). Low-sodium or reduced-sodium tomato and vegetable juice. Low-sodium or reduced-sodium tomato sauce and tomato paste. Low-sodium or reduced-sodium canned vegetables. Grains Whole-grain or whole-wheat bread. Whole-grain or whole-wheat pasta. Brown rice. [place]. Bulgur. Whole-grain and low-sodium cereals. Pita bread. Low-fat, low-sodium crackers. Whole-wheat flour tortillas. Meats and other proteins Skinless chicken or [place]. Ground chicken or [place]. Pork with fat trimmed off. Fish and seafood. Egg whites. Dried beans, peas, or lentils. Unsalted nuts, nut butters, and seeds. Unsalted canned beans. Lean cuts of beef with fat trimmed off. Low-sodium, lean precooked or cured meat, such as sausages or meat loaves. Dairy Low-fat (1%) or fat-free (skim) milk. Reduced-fat, low-fat, or fat-free cheeses. Nonfat, low-sodium ricotta or cottage cheese. Low-fat or nonfat yogurt. Low-fat, low-sodium cheese. Fats and oils Soft  margarine without trans fats. Vegetable oil. Reduced-fat, low-fat, or light mayonnaise and salad dressings (reduced-sodium). Canola, safflower, olive, avocado, soybean, and sunflower oils. Avocado. Seasonings and condiments Herbs. Spices. Seasoning mixes without salt. Other foods Unsalted popcorn and pretzels. Fat-free sweets. The items listed above may not be a complete list of foods and beverages you can eat. Contact a dietitian for more information. What foods should I avoid? Fruits Canned fruit in a light or heavy syrup. Fried fruit. Fruit in cream or butter sauce. Vegetables Creamed or fried vegetables. Vegetables in a cheese sauce. Regular canned vegetables (not low-sodium or reduced-sodium).  Regular canned tomato sauce and paste (not low-sodium or reduced-sodium). Regular tomato and vegetable juice (not low-sodium or reduced-sodium). Angie Fava. Olives. Grains Baked goods made with fat, such as croissants, muffins, or some breads. Dry pasta or rice meal packs. Meats and other proteins Fatty cuts of meat. Ribs. Fried meat. Berniece Salines. Bologna, salami, and other precooked or cured meats, such as sausages or meat loaves. Fat from the back of a pig (fatback). Bratwurst. Salted nuts and seeds. Canned beans with added salt. Canned or smoked fish. Whole eggs or egg yolks. Chicken or [place] with skin. Dairy Whole or 2% milk, cream, and half-and-half. Whole or full-fat cream cheese. Whole-fat or sweetened yogurt. Full-fat cheese. Nondairy creamers. Whipped toppings. Processed cheese and cheese spreads. Fats and oils Butter. Stick margarine. Lard. Shortening. Ghee. Bacon fat. Tropical oils, such as coconut, palm kernel, or palm oil. Seasonings and condiments Onion salt, garlic salt, seasoned salt, table salt, and sea salt. Worcestershire sauce. Tartar sauce. Barbecue sauce. Teriyaki sauce. Soy sauce, including reduced-sodium. Steak sauce. Canned and packaged gravies. Fish sauce. Oyster sauce. Cocktail sauce. Store-bought horseradish. Ketchup. Mustard. Meat flavorings and tenderizers. Bouillon cubes. Hot sauces. Pre-made or packaged marinades. Pre-made or packaged taco seasonings. Relishes. Regular salad dressings. Other foods Salted popcorn and pretzels. The items listed above may not be a complete list of foods and beverages you should avoid. Contact a dietitian for more information. Where to find more information National Heart, Lung, and Blood Institute: https://wilson-eaton.com/ American Heart Association: www.heart.org Academy of Nutrition and Dietetics: www.eatright.[place]: www.kidney.org Summary The DASH eating plan is a healthy eating plan that has been shown to reduce high  blood pressure (hypertension). It may also reduce your risk for type 2 diabetes, heart disease, and stroke. When on the DASH eating plan, aim to eat more fresh fruits and vegetables, whole grains, lean proteins, low-fat dairy, and heart-healthy fats. With the DASH eating plan, you should limit salt (sodium) intake to 2,300 mg a day. If you have hypertension, you may need to reduce your sodium intake to 1,500 mg a day. Work with your health care provider or dietitian to adjust your eating plan to your individual calorie needs. This information is not intended to replace advice given to you by your health care provider. Make sure you discuss any questions you have with your health care provider. Document Revised: 11/14/2019 Document Reviewed: 11/14/2019 Elsevier Patient Education  [place] refers to food and lifestyle choices that are based on the traditions of countries located on the [employer]. It focuses on eating more fruits, vegetables, whole grains, beans, nuts, seeds, and heart-healthy fats, and eating less dairy, meat, eggs, and processed foods with added sugar, salt, and fat. This way of eating has been shown to help prevent certain conditions and improve outcomes for people who have chronic diseases, like kidney disease  and heart disease. What are tips for following this plan? Reading food labels Check the serving size of packaged foods. For foods such as rice and pasta, the serving size refers to the amount of cooked product, not dry. Check the total fat in packaged foods. Avoid foods that have saturated fat or trans fats. Check the ingredient list for added sugars, such as corn syrup. Shopping  Buy a variety of foods that offer a balanced diet, including: Fresh fruits and vegetables (produce). Grains, beans, nuts, and seeds. Some of these may be available in unpackaged forms or large amounts (in bulk). Fresh seafood. Poultry  and eggs. Low-fat dairy products. Buy whole ingredients instead of prepackaged foods. Buy fresh fruits and vegetables in-season from local farmers markets. Buy plain frozen fruits and vegetables. If you do not have access to quality fresh seafood, buy precooked frozen shrimp or canned fish, such as tuna, salmon, or sardines. Stock your pantry so you always have certain foods on hand, such as olive oil, canned tuna, canned tomatoes, rice, pasta, and beans. Cooking Cook foods with extra-virgin olive oil instead of using butter or other vegetable oils. Have meat as a side dish, and have vegetables or grains as your main dish. This means having meat in small portions or adding small amounts of meat to foods like pasta or stew. Use beans or vegetables instead of meat in common dishes like chili or lasagna. Experiment with different cooking methods. Try roasting, broiling, steaming, and sauting vegetables. Add frozen vegetables to soups, stews, pasta, or rice. Add nuts or seeds for added healthy fats and plant protein at each meal. You can add these to yogurt, salads, or vegetable dishes. Marinate fish or vegetables using olive oil, lemon juice, garlic, and fresh herbs. Meal planning Plan to eat one vegetarian meal one day each week. Try to work up to two vegetarian meals, if possible. Eat seafood two or more times a week. Have healthy snacks readily available, such as: Vegetable sticks with hummus. Greek yogurt. Fruit and nut trail mix. Eat balanced meals throughout the week. This includes: Fruit: 2-3 servings a day. Vegetables: 4-5 servings a day. Low-fat dairy: 2 servings a day. Fish, poultry, or lean meat: 1 serving a day. Beans and legumes: 2 or more servings a week. Nuts and seeds: 1-2 servings a day. Whole grains: 6-8 servings a day. Extra-virgin olive oil: 3-4 servings a day. Limit red meat and sweets to only a few servings a month. Lifestyle  Cook and eat meals together with  your family, when possible. Drink enough fluid to keep your urine pale yellow. Be physically active every day. This includes: Aerobic exercise like running or swimming. Leisure activities like gardening, walking, or housework. Get 7-8 hours of sleep each night. If recommended by your health care provider, drink red wine in moderation. This means 1 glass a day for nonpregnant women and 2 glasses a day for men. A glass of wine equals 5 oz (150 mL). What foods should I eat? Fruits Apples. Apricots. Avocado. Berries. Bananas. Cherries. Dates. Figs. Grapes. Lemons. Melon. Oranges. Peaches. Plums. Pomegranate. Vegetables Artichokes. Beets. Broccoli. Cabbage. Carrots. Eggplant. Green beans. Chard. Kale. Spinach. Onions. Leeks. Peas. Squash. Tomatoes. Peppers. Radishes. Grains Whole-grain pasta. Brown rice. Bulgur wheat. Polenta. Couscous. Whole-wheat bread. [place]. Meats and other proteins Beans. Almonds. Sunflower seeds. Pine nuts. Peanuts. [place]. Salmon. Scallops. Shrimp. [place]. Tilapia. Clams. Oysters. Eggs. Poultry without skin. Dairy Low-fat milk. Cheese. Greek yogurt. Fats and oils Extra-virgin olive oil. Avocado oil.  Grapeseed oil. Beverages Water. Red wine. Herbal tea. Sweets and desserts Greek yogurt with honey. Baked apples. Poached pears. Trail mix. Seasonings and condiments Basil. Cilantro. Coriander. Cumin. Mint. Parsley. Sage. Rosemary. Tarragon. Garlic. Oregano. Thyme. Pepper. Balsamic vinegar. Tahini. Hummus. Tomato sauce. Olives. Mushrooms. The items listed above may not be a complete list of foods and beverages you can eat. Contact a dietitian for more information. What foods should I limit? This is a list of foods that should be eaten rarely or only on special occasions. Fruits Fruit canned in syrup. Vegetables Deep-fried potatoes (french fries). Grains Prepackaged pasta or rice dishes. Prepackaged cereal with added sugar. Prepackaged snacks with added  sugar. Meats and other proteins Beef. Pork. Lamb. Poultry with skin. Hot dogs. Berniece Salines. Dairy Ice cream. Sour cream. Whole milk. Fats and oils Butter. Canola oil. Vegetable oil. Beef fat (tallow). Lard. Beverages Juice. Sugar-sweetened soft drinks. Beer. Liquor and spirits. Sweets and desserts Cookies. Cakes. Pies. Candy. Seasonings and condiments Mayonnaise. Pre-made sauces and marinades. The items listed above may not be a complete list of foods and beverages you should limit. Contact a dietitian for more information. Summary The Mediterranean diet includes both food and lifestyle choices. Eat a variety of fresh fruits and vegetables, beans, nuts, seeds, and whole grains. Limit the amount of red meat and sweets that you eat. If recommended by your health care provider, drink red wine in moderation. This means 1 glass a day for nonpregnant women and 2 glasses a day for men. A glass of wine equals 5 oz (150 mL). This information is not intended to replace advice given to you by your health care provider. Make sure you discuss any questions you have with your health care provider. Document Revised: 01/16/2020 Document Reviewed: 11/13/2019 Elsevier Patient Education  [place].

## 2023-03-21 ENCOUNTER — Ambulatory Visit: Payer: Medicare PPO | Admitting: Physician Assistant

## 2023-04-06 ENCOUNTER — Ambulatory Visit (HOSPITAL_COMMUNITY)
Admission: RE | Admit: 2023-04-06 | Payer: Medicare PPO | Source: Ambulatory Visit | Attending: Nurse Practitioner | Admitting: Nurse Practitioner

## 2023-04-18 ENCOUNTER — Ambulatory Visit (HOSPITAL_COMMUNITY)
Admission: RE | Admit: 2023-04-18 | Discharge: 2023-04-18 | Disposition: A | Discharge status: Home / Self Care | Payer: Medicare PPO | Source: Ambulatory Visit | Attending: Internal Medicine | Admitting: Internal Medicine

## 2023-04-18 DIAGNOSIS — I2510 Atherosclerotic heart disease of native coronary artery without angina pectoris: Secondary | ICD-10-CM

## 2023-04-18 DIAGNOSIS — I700 Atherosclerosis of aorta: Secondary | ICD-10-CM

## 2023-04-19 ENCOUNTER — Encounter (HOSPITAL_COMMUNITY): Payer: Self-pay | Admitting: Cardiology

## 2023-07-03 DIAGNOSIS — Z6824 Body mass index (BMI) 24.0-24.9, adult: Secondary | ICD-10-CM | POA: Diagnosis not present

## 2023-07-03 DIAGNOSIS — R5383 Other fatigue: Secondary | ICD-10-CM | POA: Diagnosis not present

## 2023-07-03 DIAGNOSIS — I700 Atherosclerosis of aorta: Secondary | ICD-10-CM | POA: Diagnosis not present

## 2023-07-03 DIAGNOSIS — E7800 Pure hypercholesterolemia, unspecified: Secondary | ICD-10-CM | POA: Diagnosis not present

## 2023-07-03 DIAGNOSIS — J439 Emphysema, unspecified: Secondary | ICD-10-CM | POA: Diagnosis not present

## 2023-07-03 DIAGNOSIS — R109 Unspecified abdominal pain: Secondary | ICD-10-CM | POA: Diagnosis not present

## 2023-07-10 DIAGNOSIS — I10 Essential (primary) hypertension: Secondary | ICD-10-CM | POA: Diagnosis not present

## 2023-07-10 DIAGNOSIS — R109 Unspecified abdominal pain: Secondary | ICD-10-CM | POA: Diagnosis not present

## 2023-07-25 DIAGNOSIS — H52203 Unspecified astigmatism, bilateral: Secondary | ICD-10-CM | POA: Diagnosis not present

## 2023-07-25 DIAGNOSIS — H35372 Puckering of macula, left eye: Secondary | ICD-10-CM | POA: Diagnosis not present

## 2023-07-25 DIAGNOSIS — H2513 Age-related nuclear cataract, bilateral: Secondary | ICD-10-CM | POA: Diagnosis not present

## 2023-08-14 DIAGNOSIS — F17200 Nicotine dependence, unspecified, uncomplicated: Secondary | ICD-10-CM | POA: Diagnosis not present

## 2023-08-14 DIAGNOSIS — Z1389 Encounter for screening for other disorder: Secondary | ICD-10-CM | POA: Diagnosis not present

## 2023-08-14 DIAGNOSIS — Z6824 Body mass index (BMI) 24.0-24.9, adult: Secondary | ICD-10-CM | POA: Diagnosis not present

## 2023-08-14 DIAGNOSIS — Z0000 Encounter for general adult medical examination without abnormal findings: Secondary | ICD-10-CM | POA: Diagnosis not present

## 2023-08-14 DIAGNOSIS — J449 Chronic obstructive pulmonary disease, unspecified: Secondary | ICD-10-CM | POA: Diagnosis not present

## 2023-08-14 DIAGNOSIS — M545 Low back pain, unspecified: Secondary | ICD-10-CM | POA: Diagnosis not present

## 2023-08-15 ENCOUNTER — Encounter: Payer: Self-pay | Admitting: Family Medicine

## 2023-08-24 ENCOUNTER — Other Ambulatory Visit: Payer: Self-pay | Admitting: Family Medicine

## 2023-08-24 DIAGNOSIS — R911 Solitary pulmonary nodule: Secondary | ICD-10-CM

## 2023-08-29 DIAGNOSIS — Z1231 Encounter for screening mammogram for malignant neoplasm of breast: Secondary | ICD-10-CM | POA: Diagnosis not present

## 2023-09-24 ENCOUNTER — Ambulatory Visit
Admission: RE | Admit: 2023-09-24 | Discharge: 2023-09-24 | Disposition: A | Discharge status: Home / Self Care | Payer: Medicare PPO | Source: Ambulatory Visit | Attending: Family Medicine

## 2023-09-24 DIAGNOSIS — R918 Other nonspecific abnormal finding of lung field: Secondary | ICD-10-CM | POA: Diagnosis not present

## 2023-09-24 DIAGNOSIS — R911 Solitary pulmonary nodule: Secondary | ICD-10-CM

## 2023-09-24 DIAGNOSIS — I700 Atherosclerosis of aorta: Secondary | ICD-10-CM | POA: Diagnosis not present

## 2023-09-24 DIAGNOSIS — J432 Centrilobular emphysema: Secondary | ICD-10-CM | POA: Diagnosis not present

## 2023-09-27 DIAGNOSIS — L570 Actinic keratosis: Secondary | ICD-10-CM | POA: Diagnosis not present

## 2023-09-27 DIAGNOSIS — Z85828 Personal history of other malignant neoplasm of skin: Secondary | ICD-10-CM | POA: Diagnosis not present

## 2023-09-27 DIAGNOSIS — L820 Inflamed seborrheic keratosis: Secondary | ICD-10-CM | POA: Diagnosis not present

## 2023-10-03 DIAGNOSIS — M5442 Lumbago with sciatica, left side: Secondary | ICD-10-CM | POA: Diagnosis not present

## 2023-10-19 ENCOUNTER — Telehealth: Payer: Self-pay | Admitting: Emergency Medicine

## 2023-10-19 ENCOUNTER — Other Ambulatory Visit: Payer: Self-pay

## 2023-10-19 DIAGNOSIS — R918 Other nonspecific abnormal finding of lung field: Secondary | ICD-10-CM

## 2023-10-19 NOTE — Telephone Encounter (Signed)
New consult to be scheduled with Dr. Delton Coombes.  Patient had previous CT chest wo with PCP.  Results 4B growing nodule over 8mm.  Dr. Delton Coombes requests the following upon review and chat message:  order the PET and set her up to see me on 10/31 (I just opened that day up). Order for PET placed with note for 10/31 scheduling

## 2023-11-05 ENCOUNTER — Encounter (HOSPITAL_COMMUNITY)
Admission: RE | Admit: 2023-11-05 | Discharge: 2023-11-05 | Disposition: A | Discharge status: Home / Self Care | Payer: Medicare PPO | Source: Ambulatory Visit | Attending: Emergency Medicine | Admitting: Emergency Medicine

## 2023-11-05 DIAGNOSIS — R911 Solitary pulmonary nodule: Secondary | ICD-10-CM | POA: Diagnosis not present

## 2023-11-05 DIAGNOSIS — R918 Other nonspecific abnormal finding of lung field: Secondary | ICD-10-CM | POA: Diagnosis not present

## 2023-11-05 LAB — GLUCOSE, CAPILLARY: Glucose-Capillary: 93 mg/dL (ref 70–99)

## 2023-11-05 MED ORDER — FLUDEOXYGLUCOSE F - 18 (FDG) INJECTION
7.3000 | Freq: Once | INTRAVENOUS | Status: AC
  Administered 2023-11-05: 6.96 via INTRAVENOUS

## 2023-11-08 DIAGNOSIS — D485 Neoplasm of uncertain behavior of skin: Secondary | ICD-10-CM | POA: Diagnosis not present

## 2023-11-08 DIAGNOSIS — Z85828 Personal history of other malignant neoplasm of skin: Secondary | ICD-10-CM | POA: Diagnosis not present

## 2023-11-08 DIAGNOSIS — L821 Other seborrheic keratosis: Secondary | ICD-10-CM | POA: Diagnosis not present

## 2023-11-08 DIAGNOSIS — D0439 Carcinoma in situ of skin of other parts of face: Secondary | ICD-10-CM | POA: Diagnosis not present

## 2023-12-04 ENCOUNTER — Encounter: Payer: Self-pay | Admitting: Emergency Medicine

## 2023-12-04 ENCOUNTER — Ambulatory Visit: Payer: Medicare PPO | Admitting: Emergency Medicine

## 2023-12-04 VITALS — BP 124/70 | HR 69 | Temp 97.9°F | Ht 66.5 in | Wt 138.8 lb

## 2023-12-04 DIAGNOSIS — R911 Solitary pulmonary nodule: Secondary | ICD-10-CM

## 2023-12-04 DIAGNOSIS — Z720 Tobacco use: Secondary | ICD-10-CM | POA: Diagnosis not present

## 2023-12-04 DIAGNOSIS — J449 Chronic obstructive pulmonary disease, unspecified: Secondary | ICD-10-CM | POA: Diagnosis not present

## 2023-12-04 DIAGNOSIS — R918 Other nonspecific abnormal finding of lung field: Secondary | ICD-10-CM | POA: Diagnosis not present

## 2023-12-04 NOTE — Patient Instructions (Addendum)
VISIT SUMMARY:  During today's visit, we discussed the results of your recent imaging studies, including a CT scan and a PET scan, and reviewed your overall health status. We focused on the findings related to your lung nodules and planned the next steps for further evaluation.  YOUR PLAN:  -RIGHT LOWER LOBE PULMONARY NODULE: This is a growth in the lower part of your right lung that has increased in size since your last scan. It showed mild activity on the PET scan, which means it could be a slow-growing lung cancer. We will perform a bronchoscopy to take a tissue sample for further analysis. Please ensure you have a designated driver and someone to stay with you after the procedure, or plan for an overnight hospital stay.  You will need a repeat CT scan of your chest without contrast prior to the bronchoscopy and we will arrange for this as well.  -NEW LEFT UPPER LOBE PULMONARY NODULE: A new small nodule was found in the upper part of your left lung. We did not discuss any further steps for this nodule today.  -GENERAL HEALTH MAINTENANCE: He would benefit from smoking cessation.  We will talk more about this going forward.  You also might benefit from pulmonary function testing and evaluation for underlying COPD and inhaled medication in the future.  INSTRUCTIONS:  Please schedule your bronchoscopy for December 31, 2023. Ensure you have a designated driver and someone to stay with you after the procedure, or plan for an overnight hospital stay.

## 2023-12-04 NOTE — Progress Notes (Signed)
Subjective:    Patient ID: Karen Bonilla, female    DOB: [birth year], 82 y.o.   MRN: [id number]  HPI 82 year old active smoker (67 pack years) with coronary artery disease, hyperlipidemia, multiple basal cell and squamous cell carcinomas that have been resected, lumbar sciatica, anemia, diverticular disease.  She has participated in lung cancer screening program and is here today to discuss most recent CT scan of the chest done 09/24/2023, subsequent PET scan. Today she reports  She recently underwent a CT scan in September 2024 and a subsequent PET scan in November 2024 for nodule follow-up. The CT scan revealed moderate central lobular emphysematous change and an irregular solid right lower lobe nodule in the paraspinous region, measuring 2.4 by 1.6 cm. This nodule has increased in size compared to the January 2023 scan, where it measured 2.0 by 1.1 cm. A new 3 mm solid left upper lobe nodule was also identified. The PET scan showed mild hypermetabolism in the medial right lower lobe nodule with a max SUV of 3.6. There was no hypermetabolic thoracic lymphadenopathy or evidence of any disease.  The patient reports no significant respiratory symptoms and is able to carry out her day-to-day activities without difficulty. She has an albuterol inhaler, which she uses infrequently when she feels short of breath. She reports no significant exposure to inhaled substances other than tobacco. She lives alone and has a history of hernia surgery about a year and a half ago, which required an overnight hospital stay due to her age and living situation. She also underwent a colonoscopy with sedation, which she tolerated well.   RADIOLOGY Chest CT: Moderate central lobular emphysematous change and a 2.4 x 1.6 cm irregular solid right lower lobe nodule in the paraspinous region, increased in size compared with January 2023 (2.0 x 1.1 cm). New 3 mm solid left upper lobe nodule. (09/24/2023) PET scan: Mild  hypermetabolism in the medial right lower lobe nodule with a max SUV 3.6. No hypermetabolic thoracic lymphadenopathy or evidence of any disease. (11/05/2023)   Review of Systems As per HPI  Past Medical History:  Diagnosis Date   Abnormal chest CT    Anemia    Anosmia    Aortic atherosclerosis (HCC)    Arthritis    Back pain of lumbar region with sciatica    Basal cell carcinoma 09/01/2002   right shin tx exc   BCC (basal cell carcinoma of skin) 06/20/2013   right forearm TX WITH BX   BCC (basal cell carcinoma of skin) 06/20/2013   right upper lip TX WITH BX   BCC (basal cell carcinoma of skin) 10/03/2016   mid upper back TX WITH BX   CAD (coronary artery disease)    Colon polyps    COPD (chronic obstructive pulmonary disease) (HCC)    DDD (degenerative disc disease), lumbar    Diverticulosis large intestine w/o perforation or abscess w/o bleeding    Emphysema, unspecified (HCC)    History of colonic polyps    Hypercholesterolemia    Hyperlipidemia    Inguinal hernia    Mucoid cyst of joint    SCC (squamous cell carcinoma) 01/23/2011   left forearm scc/ka  TX CX3 5FU CAUTERY   SCC (squamous cell carcinoma) 10/03/2016   right shin scc/ka TX CX3 5FU CAUTERY   SCC (squamous cell carcinoma) 07/29/2019   left forehead scc TX CX3 5FU CAUTERY   Scoliosis of lumbar spine, unspecified scoliosis type    Seasonal allergies    Senile  purpura (HCC)    Spinal stenosis    Spinal stenosis    Squamous cell carcinoma of skin 10/23/2005   left lower shin inf. bowens  TX CX3 5FU CAUTERY   Squamous cell carcinoma of skin 08/06/2020   in situ- right lower leg (CX35FU)   Squamous cell carcinoma of skin 08/06/2020   in situ- right lower leg- posterior lower (CX35FU)   Varicose veins of lower extremities with other complications 05/21/2012     Family History  Problem Relation Age of Onset   Hypertension Mother    Other Father        varicose veins   Hypertension Father      Social  History   Socioeconomic History   Marital status: Divorced    Spouse name: Not on file   Number of children: Not on file   Years of education: Not on file   Highest education level: Not on file  Occupational History   Not on file  Tobacco Use   Smoking status: Every Day    Current packs/day: 1.00    Average packs/day: 1 pack/day for 66.9 years (66.9 ttl pk-yrs)    Types: Cigarettes    Start date: 1958   Smokeless tobacco: Never   Tobacco comments:    Smoking less than a pack per day.  She does not inhale the smoke.  12/04/2023.  Has been smoking for 65 years.  Not trying to quit.  Vaping Use   Vaping status: Never Used  Substance and Sexual Activity   Alcohol use: Yes    Alcohol/week: 5.0 standard drinks of alcohol    Types: 5 Glasses of wine per week    Comment: WINE NIGHTLY WITH DINNER   Drug use: No   Sexual activity: Not on file  Other Topics Concern   Not on file  Social History Narrative   Not on file   Social Determinants of Health   Financial Resource Strain: Not on file  Food Insecurity: Not on file  Transportation Needs: Not on file  Physical Activity: Not on file  Stress: Not on file  Social Connections: Not on file  Intimate Partner Violence: Not on file     No Known Allergies   Outpatient Medications Prior to Visit  Medication Sig Dispense Refill   albuterol (PROVENTIL HFA;VENTOLIN HFA) 108 (90 BASE) MCG/ACT inhaler Inhale into the lungs every 6 (six) hours as needed for wheezing or shortness of breath.     alendronate (FOSAMAX) 70 MG tablet Take 70 mg by mouth once a week.     calcium carbonate (TUMS - DOSED IN MG ELEMENTAL CALCIUM) 500 MG chewable tablet Chew 1 tablet by mouth daily as needed.     cholecalciferol (VITAMIN D) 25 MCG (1000 UNIT) tablet Take 1,000 Units by mouth at bedtime.     ibuprofen (ADVIL) 200 MG tablet Take 200 mg by mouth every 6 (six) hours as needed. 1 tablet daily as needed.     Polyethyl Glycol-Propyl Glycol (SYSTANE)  0.4-0.3 % SOLN Place 1-2 drops into both eyes 2 (two) times daily as needed (dry/irritated eyes.).     pravastatin (PRAVACHOL) 20 MG tablet Take 20 mg by mouth at bedtime.     traMADol (ULTRAM) 50 MG tablet Take 1 tablet (50 mg total) by mouth every 6 (six) hours as needed for moderate pain or severe pain. (Patient not taking: Reported on 12/04/2023) 10 tablet 0   meloxicam (MOBIC) 15 MG tablet Take 1 tablet (15 mg total) by  mouth daily. (Patient not taking: Reported on 12/04/2023) 30 tablet 1   No facility-administered medications prior to visit.        Objective:   Physical Exam  Vitals:   12/04/23 1259  BP: 124/70  Pulse: 69  Temp: 97.9 F (36.6 C)  TempSrc: Oral  SpO2: 98%  Weight: 138 lb 12.8 oz (63 kg)  Height: 5' 6.5" (1.689 m)    Gen: Pleasant, thin, in no distress,  normal affect  ENT: No lesions,  mouth clear,  oropharynx clear, no postnasal drip, poor dentition  Neck: No JVD, no stridor  Lungs: No use of accessory muscles, distant but no crackles or wheezing on normal respiration, no wheeze on forced expiration  Cardiovascular: RRR, heart sounds normal, no murmur or gallops, no peripheral edema  Musculoskeletal: No deformities, no cyanosis or clubbing  Neuro: alert, awake, non focal  Skin: Warm, no lesions or rash       Assessment & Plan:  Pulmonary nodules Right Lower Lobe Pulmonary Nodule Increased in size from 2.0 x 1.1 cm in January 2023 to 2.4 x 1.6 cm in September 2024. Mild hypermetabolism on PET scan with max SUV 3.6. No hypermetabolic thoracic lymphadenopathy or distant disease. Discussed the possibility of slow-growing lung cancer and the need for tissue diagnosis. -Schedule bronchoscopy for tissue biopsy on December 31, 2023. -Pre-procedure planning includes ensuring patient has a designated driver and someone to watch her at home post-procedure or plan for overnight hospital stay. -She needs a new super D CT now to facilitate bronchoscopy,  ordered  New Left Upper Lobe Pulmonary Nodule New 3mm solid nodule identified on CT scan in September 2024.  Will need serial follow-up    Chronic obstructive pulmonary disease (HCC) History of tobacco use, presumed COPD with emphysematous change on her CT scan although she has good functional capacity.  She rarely uses albuterol and is not on any scheduled BD therapy.  We can consider getting PFT and BD's going forward.  Tobacco use Continued tobacco use.  Needs cessation, will work on this as we go forward.   Levy Pupa, MD, PhD 12/04/2023, 1:28 PM [facility] [phone] or if no answer before 7:00PM call [phone] For any issues after 7:00PM please call eLink [phone]

## 2023-12-04 NOTE — Assessment & Plan Note (Signed)
History of tobacco use, presumed COPD with emphysematous change on her CT scan although she has good functional capacity.  She rarely uses albuterol and is not on any scheduled BD therapy.  We can consider getting PFT and BD's going forward.

## 2023-12-04 NOTE — Assessment & Plan Note (Signed)
Right Lower Lobe Pulmonary Nodule Increased in size from 2.0 x 1.1 cm in January 2023 to 2.4 x 1.6 cm in September 2024. Mild hypermetabolism on PET scan with max SUV 3.6. No hypermetabolic thoracic lymphadenopathy or distant disease. Discussed the possibility of slow-growing lung cancer and the need for tissue diagnosis. -Schedule bronchoscopy for tissue biopsy on December 31, 2023. -Pre-procedure planning includes ensuring patient has a designated driver and someone to watch her at home post-procedure or plan for overnight hospital stay. -She needs a new super D CT now to facilitate bronchoscopy, ordered  New Left Upper Lobe Pulmonary Nodule New 3mm solid nodule identified on CT scan in September 2024.  Will need serial follow-up

## 2023-12-04 NOTE — Assessment & Plan Note (Signed)
Continued tobacco use.  Needs cessation, will work on this as we go forward.

## 2023-12-10 ENCOUNTER — Telehealth: Payer: Self-pay | Admitting: Emergency Medicine

## 2023-12-10 NOTE — Telephone Encounter (Signed)
Spoke to the patient and gave her the appt info and will mail her a new letter

## 2023-12-24 ENCOUNTER — Telehealth: Payer: Self-pay | Admitting: Acute Care

## 2023-12-24 NOTE — Telephone Encounter (Signed)
Called patient.  Patient wants to know:  How long after the bronchoscopy will she be able to talk, eat, and drink?  Due to patients age and the fact that she lives alone, is it possible for patient to stay overnight in the hospital after the bronchoscopy?   Need insurance prior authorization?  Patient has other questions about the actual bronchoscopy procedure.  Could Dr. Delton Coombes or a member of the pre-surgical team call the patient?  I instructed patient a nurse from the surgical team would be calling the patient to review the procedure and answer any questions.  Please advise.

## 2023-12-24 NOTE — Telephone Encounter (Signed)
Called and spoke with the patient, answered all her questions.  It does not sound like she is going to have anybody available to watch her at home on the day of the procedure.  This might necessitate admitting her for observation.  We will discuss on the procedure date and decide appropriate disposition

## 2023-12-24 NOTE — Telephone Encounter (Signed)
Patient states has questions about Bronch procedure. Patient phone number is [phone].

## 2023-12-27 ENCOUNTER — Other Ambulatory Visit: Payer: Self-pay

## 2023-12-27 ENCOUNTER — Encounter (HOSPITAL_COMMUNITY): Payer: Self-pay | Admitting: Emergency Medicine

## 2023-12-27 NOTE — Progress Notes (Signed)
 PCP - Bernardino Boone, DO Cardiologist - [facility] Hx Dr Candyce Reek (last OV 03/15/23)  CT Chest x-ray - 09/24/23 EKG - 03/15/23 Stress Test - 01/11/10 ECHO - n/a Cardiac Cath - n/a  ICD Pacemaker/Loop - n/a  Sleep Study -  n/a CPAP - none  Diabetes - n/a  NPO   Anesthesia review: Yes  STOP now taking any Aspirin (unless otherwise instructed by your surgeon), Aleve, Naproxen, Ibuprofen , Motrin , Advil , Goody's, BC's, all herbal medications, fish oil, and all vitamins.   Coronavirus Screening Do you have any of the following symptoms:  Cough yes/no: No Fever (>100.53F)  yes/no: No Runny nose yes/no: No Sore throat yes/no: No Difficulty breathing/shortness of breath  yes/no: No  Have you traveled in the last 14 days and where? yes/no: No  Patient verbalized understanding of instructions that were given via phone.

## 2023-12-28 ENCOUNTER — Ambulatory Visit (HOSPITAL_COMMUNITY)
Admission: RE | Admit: 2023-12-28 | Discharge: 2023-12-28 | Disposition: A | Discharge status: Home / Self Care | Payer: Medicare PPO | Source: Ambulatory Visit | Attending: Emergency Medicine | Admitting: Emergency Medicine

## 2023-12-28 DIAGNOSIS — I700 Atherosclerosis of aorta: Secondary | ICD-10-CM | POA: Diagnosis not present

## 2023-12-28 DIAGNOSIS — R918 Other nonspecific abnormal finding of lung field: Secondary | ICD-10-CM | POA: Diagnosis not present

## 2023-12-28 DIAGNOSIS — R911 Solitary pulmonary nodule: Secondary | ICD-10-CM | POA: Diagnosis not present

## 2023-12-28 DIAGNOSIS — J439 Emphysema, unspecified: Secondary | ICD-10-CM | POA: Diagnosis not present

## 2023-12-28 NOTE — Progress Notes (Signed)
 Anesthesia Chart Review: Karen Bonilla  Case: [id number] Date/Time: 12/31/23 0915   Procedure: ROBOTIC ASSISTED NAVIGATIONAL BRONCHOSCOPY   Anesthesia type: General   Pre-op diagnosis: RIGHT LOWER LOPE NODULE   [facility] / [facility]   Surgeons: Shelah Lamar RAMAN, MD       DISCUSSION: Patient is an 32 old female scheduled for the above procedure.  Above procedure recommended due to increased size of RLL lung nodules, hypermetabolic on recent PET scan. + Smoker.   History includes smoking, COPD, HLD, CAD, anemia, skin cancer (BCC, SCC), varicose veins, inguinal hernia (s/p bilateral IHR 01/20/22).   Last cardiology evaluation was on 03/15/23 with Percy Browning, NP for follow-up CAD (coronary and aortic valve atherosclerotic calcifications on CT imaging 2019). Given lack of symptoms, stress testing not pursued. She did not tolerate ASA due to bruising. Continue statin. Smoking cessation encouraged. 1 year follow-up planned.   Per Dr. Lanny notation, if she is unable to have someone stay with her postoperatively she may require observation overnight.  She is a same-day workup.  Anesthesia team to evaluate on the day of surgery.  Updated labs as indicated on arrival.  VS: Ht 5' 6.5 (1.689 m)   Wt 63 kg   BMI 22.08 kg/m  BP Readings from Last 3 Encounters:  12/04/23 124/70  03/15/23 (!) 142/64  03/13/22 130/74   Pulse Readings from Last 3 Encounters:  12/04/23 69  03/15/23 64  03/13/22 90     PROVIDERS: Dayna Motto, DO is PCP  Dann Beverage, MD is cardiologist   LABS: For day of surgery as indicated.    IMAGES: CT Super D Chest 12/28/23: In process.  PET Scan 11/05/23: IMPRESSION: - 2.3 cm nodule at the medial right lung base, suspicious for primary bronchogenic carcinoma. - No evidence of metastatic disease.    EKG: 03/15/2023: Sinus rhythm with premature supraventricular complexes.  Nonspecific ST abnormality.   CV: US  Carotid  04/18/23: Summary:  Right Carotid: Normal: little or no evidence of plaque visualized in the  right internal carotid artery. Minimal calcified plaque noted in the  distal CCA.  Left Carotid: Mild: plaque visualized in the left internal carotid artery.    US  Abd Aorta 04/18/23: Summary: There is no evidence of an abdominal aortic aneurysm.  Atherosclerosis noted throughout aorta.    Nuclear stress test 01/11/10:  Normal myocardial perfusion study.  Poststress EF 75%.  Exercise capacity 10 METS.  Past Medical History:  Diagnosis Date   Abnormal chest CT    Anemia    Anosmia    Aortic atherosclerosis (HCC)    Arthritis    Back pain of lumbar region with sciatica    Basal cell carcinoma 09/01/2002   right shin tx exc   BCC (basal cell carcinoma of skin) 06/20/2013   right forearm TX WITH BX   BCC (basal cell carcinoma of skin) 06/20/2013   right upper lip TX WITH BX   BCC (basal cell carcinoma of skin) 10/03/2016   mid upper back TX WITH BX   CAD (coronary artery disease)    Colon polyps    COPD (chronic obstructive pulmonary disease) (HCC)    DDD (degenerative disc disease), lumbar    Diverticulosis large intestine w/o perforation or abscess w/o bleeding    Emphysema, unspecified (HCC)    History of colonic polyps    Hypercholesterolemia    Hyperlipidemia    Inguinal hernia    Mucoid cyst of joint  SCC (squamous cell carcinoma) 01/23/2011   left forearm scc/ka  TX CX3 5FU CAUTERY   SCC (squamous cell carcinoma) 10/03/2016   right shin scc/ka TX CX3 5FU CAUTERY   SCC (squamous cell carcinoma) 07/29/2019   left forehead scc TX CX3 5FU CAUTERY   Scoliosis of lumbar spine, unspecified scoliosis type    Seasonal allergies    Senile purpura (HCC)    Spinal stenosis    Spinal stenosis    Squamous cell carcinoma of skin 10/23/2005   left lower shin inf. bowens  TX CX3 5FU CAUTERY   Squamous cell carcinoma of skin 08/06/2020   in situ- right lower leg (CX35FU)   Squamous  cell carcinoma of skin 08/06/2020   in situ- right lower leg- posterior lower (CX35FU)   Varicose veins of lower extremities with other complications 05/21/2012    Past Surgical History:  Procedure Laterality Date   ABDOMINAL HYSTERECTOMY     APPENDECTOMY     BLEPHAROPLASTY     COLONOSCOPY  2024   INGUINAL HERNIA REPAIR Bilateral 01/20/2022   Procedure: LAPAROSCOPIC BILATERAL INGUINAL HERNIA REPAIR WITH MESH;  Surgeon: Kinsinger, Herlene Righter, MD;  Location: [facility];  Service: General;  Laterality: Bilateral;   MASS EXCISION  01/11/2012   Procedure: MINOR EXCISION OF MASS;  Surgeon: Lamar LULLA Leonor Mickey., MD;  Location:  SUR[facility]ERY [facility];  Service: Orthopedics;  Laterality: Right;  excision mucoid cyst right index and dip joint debridement    MEDICATIONS: No current facility-administered medications for this encounter.    albuterol  (PROVENTIL  HFA;VENTOLIN  HFA) 108 (90 BASE) MCG/ACT inhaler   alendronate (FOSAMAX) 70 MG tablet   calcium carbonate (TUMS - DOSED IN MG ELEMENTAL CALCIUM) 500 MG chewable tablet   cholecalciferol  (VITAMIN D ) 25 MCG (1000 UNIT) tablet   ibuprofen  (ADVIL ) 200 MG tablet   Polyethyl Glycol-Propyl Glycol (SYSTANE) 0.4-0.3 % SOLN   pravastatin  (PRAVACHOL ) 20 MG tablet   traMADol  (ULTRAM ) 50 MG tablet    Isaiah Ruder, PA-C Surgical Short Stay/Anesthesiology [facility] Phone [phone] [facility] Phone [phone] 12/28/2023 1:34 PM

## 2023-12-28 NOTE — Anesthesia Preprocedure Evaluation (Addendum)
 Anesthesia Evaluation  Patient identified by MRN, date of birth, ID band Patient awake    Reviewed: Allergy & Precautions, NPO status , Patient's Chart, lab work & pertinent test results  Airway Mallampati: I  TM Distance: >3 FB Neck ROM: Full    Dental  (+) Chipped, Dental Advisory Given,    Pulmonary COPD,  COPD inhaler, Current Smoker and Patient abstained from smoking.   Pulmonary exam normal breath sounds clear to auscultation       Cardiovascular + CAD and + Peripheral Vascular Disease  Normal cardiovascular exam Rhythm:Regular Rate:Normal     Neuro/Psych negative neurological ROS  negative psych ROS   GI/Hepatic negative GI ROS, Neg liver ROS,,,  Endo/Other  negative endocrine ROS    Renal/GU negative Renal ROS  negative genitourinary   Musculoskeletal  (+) Arthritis ,    Abdominal   Peds  Hematology negative hematology ROS (+)   Anesthesia Other Findings History includes smoking, COPD, HLD, CAD, anemia, skin cancer (BCC, SCC), varicose veins, inguinal hernia (s/p bilateral IHR 01/20/22).   Reproductive/Obstetrics                             Anesthesia Physical Anesthesia Plan  ASA: 3  Anesthesia Plan: General   Post-op Pain Management:    Induction: Intravenous  PONV Risk Score and Plan: 2 and Dexamethasone , Ondansetron  and Treatment may vary due to age or medical condition  Airway Management Planned: Oral ETT  Additional Equipment:   Intra-op Plan:   Post-operative Plan: Extubation in OR  Informed Consent: I have reviewed the patients History and Physical, chart, labs and discussed the procedure including the risks, benefits and alternatives for the proposed anesthesia with the patient or authorized representative who has indicated his/her understanding and acceptance.     Dental advisory given  Plan Discussed with: CRNA  Anesthesia Plan Comments: (PAT note  written 12/28/2023 by Allison Zelenak, PA-C.  )       Anesthesia Quick Evaluation

## 2023-12-31 ENCOUNTER — Encounter (HOSPITAL_COMMUNITY): Payer: Self-pay | Admitting: Emergency Medicine

## 2023-12-31 ENCOUNTER — Ambulatory Visit (HOSPITAL_BASED_OUTPATIENT_CLINIC_OR_DEPARTMENT_OTHER): Payer: Self-pay | Admitting: Certified Registered Nurse Anesthetist

## 2023-12-31 ENCOUNTER — Ambulatory Visit (HOSPITAL_COMMUNITY)
Admission: RE | Admit: 2023-12-31 | Discharge: 2023-12-31 | Disposition: A | Discharge status: Home / Self Care | Payer: Medicare PPO | Attending: Emergency Medicine | Admitting: Emergency Medicine

## 2023-12-31 ENCOUNTER — Other Ambulatory Visit: Payer: Self-pay

## 2023-12-31 ENCOUNTER — Ambulatory Visit (HOSPITAL_COMMUNITY): Payer: Self-pay | Admitting: Certified Registered Nurse Anesthetist

## 2023-12-31 ENCOUNTER — Encounter (HOSPITAL_COMMUNITY)
Admission: RE | Disposition: A | Discharge status: Home / Self Care | Payer: Self-pay | Source: Home / Self Care | Attending: Emergency Medicine

## 2023-12-31 ENCOUNTER — Ambulatory Visit (HOSPITAL_COMMUNITY): Payer: Medicare PPO

## 2023-12-31 ENCOUNTER — Telehealth: Payer: Self-pay | Admitting: Emergency Medicine

## 2023-12-31 DIAGNOSIS — I2510 Atherosclerotic heart disease of native coronary artery without angina pectoris: Secondary | ICD-10-CM | POA: Insufficient documentation

## 2023-12-31 DIAGNOSIS — R918 Other nonspecific abnormal finding of lung field: Secondary | ICD-10-CM | POA: Insufficient documentation

## 2023-12-31 DIAGNOSIS — Z48813 Encounter for surgical aftercare following surgery on the respiratory system: Secondary | ICD-10-CM | POA: Diagnosis not present

## 2023-12-31 DIAGNOSIS — Z85828 Personal history of other malignant neoplasm of skin: Secondary | ICD-10-CM | POA: Diagnosis not present

## 2023-12-31 DIAGNOSIS — F17210 Nicotine dependence, cigarettes, uncomplicated: Secondary | ICD-10-CM | POA: Diagnosis not present

## 2023-12-31 DIAGNOSIS — J95811 Postprocedural pneumothorax: Secondary | ICD-10-CM

## 2023-12-31 DIAGNOSIS — R911 Solitary pulmonary nodule: Secondary | ICD-10-CM | POA: Diagnosis not present

## 2023-12-31 DIAGNOSIS — J439 Emphysema, unspecified: Secondary | ICD-10-CM | POA: Diagnosis not present

## 2023-12-31 DIAGNOSIS — J449 Chronic obstructive pulmonary disease, unspecified: Secondary | ICD-10-CM | POA: Diagnosis not present

## 2023-12-31 DIAGNOSIS — J984 Other disorders of lung: Secondary | ICD-10-CM | POA: Diagnosis not present

## 2023-12-31 DIAGNOSIS — I700 Atherosclerosis of aorta: Secondary | ICD-10-CM | POA: Insufficient documentation

## 2023-12-31 DIAGNOSIS — J9811 Atelectasis: Secondary | ICD-10-CM | POA: Diagnosis not present

## 2023-12-31 HISTORY — PX: BRONCHIAL WASHINGS: SHX5105

## 2023-12-31 HISTORY — PX: BRONCHIAL BRUSHINGS: SHX5108

## 2023-12-31 HISTORY — PX: FIDUCIAL MARKER PLACEMENT: SHX6858

## 2023-12-31 HISTORY — PX: BRONCHIAL NEEDLE ASPIRATION BIOPSY: SHX5106

## 2023-12-31 HISTORY — PX: BRONCHIAL BIOPSY: SHX5109

## 2023-12-31 LAB — BASIC METABOLIC PANEL
Anion gap: 10 (ref 5–15)
BUN: 15 mg/dL (ref 8–23)
CO2: 23 mmol/L (ref 22–32)
Calcium: 9.3 mg/dL (ref 8.9–10.3)
Chloride: 104 mmol/L (ref 98–111)
Creatinine, Ser: 0.83 mg/dL (ref 0.44–1.00)
GFR, Estimated: >60 mL/min (ref >60)
Glucose, Bld: 85 mg/dL (ref 70–99)
Potassium: 4.1 mmol/L (ref 3.5–5.1)
Sodium: 137 mmol/L (ref 135–145)

## 2023-12-31 LAB — CBC
HCT: 44.1 % (ref 36.0–46.0)
Hemoglobin: 14.6 g/dL (ref 12.0–15.0)
MCH: 30.4 pg (ref 26.0–34.0)
MCHC: 33.1 g/dL (ref 30.0–36.0)
MCV: 91.9 fL (ref 80.0–100.0)
Platelets: 248 10*3/uL (ref 150–400)
RBC: 4.8 MIL/uL (ref 3.87–5.11)
RDW: 12 % (ref 11.5–15.5)
WBC: 4.7 10*3/uL (ref 4.0–10.5)
nRBC: 0 % (ref 0.0–0.2)

## 2023-12-31 SURGERY — BRONCHOSCOPY, WITH BIOPSY USING ELECTROMAGNETIC NAVIGATION
Anesthesia: General

## 2023-12-31 MED ORDER — SUGAMMADEX SODIUM 200 MG/2ML IV SOLN
INTRAVENOUS | Status: DC | PRN
  Administered 2023-12-31: 200 mg via INTRAVENOUS

## 2023-12-31 MED ORDER — PROPOFOL 500 MG/50ML IV EMUL
INTRAVENOUS | Status: DC | PRN
  Administered 2023-12-31: 200 ug/kg/min via INTRAVENOUS

## 2023-12-31 MED ORDER — CHLORHEXIDINE GLUCONATE 0.12 % MT SOLN
15.0000 mL | Freq: Once | OROMUCOSAL | Status: AC
Start: 2023-12-31 — End: 2023-12-31
  Administered 2023-12-31: 15 mL via OROMUCOSAL
  Filled 2023-12-31: qty 15

## 2023-12-31 MED ORDER — PROPOFOL 10 MG/ML IV BOLUS
INTRAVENOUS | Status: DC | PRN
  Administered 2023-12-31: 60 mg via INTRAVENOUS

## 2023-12-31 MED ORDER — ROCURONIUM BROMIDE 10 MG/ML (PF) SYRINGE
PREFILLED_SYRINGE | INTRAVENOUS | Status: DC | PRN
  Administered 2023-12-31: 10 mg via INTRAVENOUS
  Administered 2023-12-31: 40 mg via INTRAVENOUS

## 2023-12-31 MED ORDER — SODIUM CHLORIDE 0.9 % IV SOLN: INTRAVENOUS | Status: DC

## 2023-12-31 MED ORDER — DEXAMETHASONE SODIUM PHOSPHATE 10 MG/ML IJ SOLN
INTRAMUSCULAR | Status: DC | PRN
  Administered 2023-12-31: 5 mg via INTRAVENOUS

## 2023-12-31 MED ORDER — FENTANYL CITRATE (PF) 100 MCG/2ML IJ SOLN
INTRAMUSCULAR | Status: AC
  Filled 2023-12-31: qty 2

## 2023-12-31 MED ORDER — EPHEDRINE SULFATE-NACL 50-0.9 MG/10ML-% IV SOSY
PREFILLED_SYRINGE | INTRAVENOUS | Status: DC | PRN
  Administered 2023-12-31: 10 mg via INTRAVENOUS

## 2023-12-31 MED ORDER — ONDANSETRON HCL 4 MG/2ML IJ SOLN
INTRAMUSCULAR | Status: DC | PRN
  Administered 2023-12-31: 4 mg via INTRAVENOUS

## 2023-12-31 MED ORDER — FENTANYL CITRATE (PF) 250 MCG/5ML IJ SOLN
INTRAMUSCULAR | Status: DC | PRN
  Administered 2023-12-31: 25 ug via INTRAVENOUS
  Administered 2023-12-31: 50 ug via INTRAVENOUS
  Administered 2023-12-31: 25 ug via INTRAVENOUS

## 2023-12-31 MED ORDER — LIDOCAINE 2% (20 MG/ML) 5 ML SYRINGE
INTRAMUSCULAR | Status: DC | PRN
  Administered 2023-12-31: 60 mg via INTRAVENOUS

## 2023-12-31 MED ORDER — PHENYLEPHRINE HCL-NACL 20-0.9 MG/250ML-% IV SOLN
INTRAVENOUS | Status: DC | PRN
  Administered 2023-12-31: 10 ug/min via INTRAVENOUS

## 2023-12-31 MED ORDER — PHENYLEPHRINE 80 MCG/ML (10ML) SYRINGE FOR IV PUSH (FOR BLOOD PRESSURE SUPPORT)
PREFILLED_SYRINGE | INTRAVENOUS | Status: DC | PRN
  Administered 2023-12-31: 80 ug via INTRAVENOUS
  Administered 2023-12-31: 160 ug via INTRAVENOUS

## 2023-12-31 SURGICAL SUPPLY — 1 items: superlock fiducial marker IMPLANT

## 2023-12-31 NOTE — Discharge Instructions (Addendum)
 Flexible Bronchoscopy, Care After This sheet gives you information about how to care for yourself after your test. Your doctor may also give you more specific instructions. If you have problems or questions, contact your doctor. Follow these instructions at home: Eating and drinking When your numbness is gone and your cough and gag reflexes have come back, you may: Eat only soft foods. Slowly drink liquids. When you get home from the test, go back to your normal diet. Driving Do not drive for 24 hours if you were given a medicine to help you relax (sedative). Do not drive or use heavy machinery while taking prescription pain medicine. General instructions  Take over-the-counter and prescription medicines only as told by your doctor. Return to your normal activities as told. Ask what activities are safe for you. Do not use any products that have nicotine or tobacco in them. This includes cigarettes and e-cigarettes. If you need help quitting, ask your doctor. Keep all follow-up visits as told by your doctor. This is important. It is very important if you had a tissue sample (biopsy) taken. Get help right away if: You have shortness of breath that gets worse. You get light-headed. You feel like you are going to pass out (faint). You have chest pain. You cough up: More than a little blood. More blood than before. Summary Do not eat or drink anything (not even water) for 2 hours after your test, or until your numbing medicine wears off. Do not use cigarettes. Do not use e-cigarettes. Get help right away if you have chest pain.  Please call our office for any questions or concerns.  [phone].  This information is not intended to replace advice given to you by your health care provider. Make sure you discuss any questions you have with your health care provider. Document Released: 10/08/2009 Document Revised: 11/23/2017 Document Reviewed: 12/29/2016 Elsevier Patient Education  2020  [employer].

## 2023-12-31 NOTE — Telephone Encounter (Signed)
 Called to check on patient - she continues to feel well. No SOB or chest pain. Plan for CXR tomorrow. She will go to ED if she has problems overnight.

## 2023-12-31 NOTE — Interval H&P Note (Signed)
 History and Physical Interval Note:  12/31/2023 7:44 AM  [name]  has presented today for surgery, with the diagnosis of RIGHT LOWER LOPE NODULE.  The various methods of treatment have been discussed with the patient and family. After consideration of risks, benefits and other options for treatment, the patient has consented to  Procedure(s): ROBOTIC ASSISTED NAVIGATIONAL BRONCHOSCOPY (N/A) as a surgical intervention.  The patient's history has been reviewed, patient examined, no change in status, stable for surgery.  I have reviewed the patient's chart and labs.  Questions were answered to the patient's satisfaction.     Lamar GORMAN Chris

## 2023-12-31 NOTE — Op Note (Signed)
 Video Bronchoscopy with Robotic Assisted Bronchoscopic Navigation   Date of Operation: 12/31/2023   Pre-op Diagnosis: Right lower lobe nodule  Post-op Diagnosis: Same  Surgeon: Lamar Chris  Assistants: None  Anesthesia: General endotracheal anesthesia  Operation: Flexible video fiberoptic bronchoscopy with robotic assistance and biopsies.  Estimated Blood Loss: Minimal  Complications: None  Indications and History: Karen Bonilla is a 83 y.o. female with history of active tobacco use, CAD, basal and squamous cell skin carcinomas she has irregular solid right lower lobe pulmonary nodule and presents for robotic assisted navigational bronchoscopy to obtain a tissue diagnosis. The risks, benefits, complications, treatment options and expected outcomes were discussed with the patient.  The possibilities of pneumothorax, pneumonia, reaction to medication, pulmonary aspiration, perforation of a viscus, bleeding, failure to diagnose a condition and creating a complication requiring transfusion or operation were discussed with the patient who freely signed the consent.    Description of Procedure: The patient was seen in the Preoperative Area, was examined and was deemed appropriate to proceed.  The patient was taken to [facility] endoscopy room 3, identified as Karen Bonilla and the procedure verified as Flexible Video Fiberoptic Bronchoscopy.  A Time Out was held and the above information confirmed.   Prior to the date of the procedure a high-resolution CT scan of the chest was performed. Utilizing ION software program a virtual tracheobronchial tree was generated to allow the creation of distinct navigation pathways to the patient's parenchymal abnormalities. After being taken to the operating room general anesthesia was initiated and the patient  was orally intubated. The video fiberoptic bronchoscope was introduced via the endotracheal tube and a general inspection was performed which showed normal right  and left lung anatomy. Aspiration of the bilateral mainstems was completed to remove any remaining secretions. Robotic catheter inserted into patient's endotracheal tube.   Target #1 right lower lobe nodule: The distinct navigation pathways prepared prior to this procedure were then utilized to navigate to patient's lesion identified on CT scan. The robotic catheter was secured into place and the vision probe was withdrawn.  Lesion location was approximated using fluoroscopy.  Local registration and targeting was performed using Cios three-dimensional imaging. Under fluoroscopic guidance transbronchial needle brushings, transbronchial needle biopsies, and transbronchial forceps biopsies were performed to be sent for cytology and pathology.  Needle in lesion was confirmed using Cios three-dimensional imaging.  Under fluoroscopic guidance a single fiducial marker was placed adjacent to the nodule.  A bronchioalveolar lavage was performed in the right lower lobe and sent for cytology.  At the end of the procedure a general airway inspection was performed and there was no evidence of active bleeding. The bronchoscope was removed.  The patient tolerated the procedure well. There was no significant blood loss and there were no obvious complications. A post-procedural chest x-ray is pending.  Samples Target #1: 1. Transbronchial needle brushings from right lower lobe nodule 2. Transbronchial Wang needle biopsies from right lower lobe nodule 3. Transbronchial forceps biopsies from right lower lobe nodule 4. Bronchoalveolar lavage from right lower lobe    Plans:  The patient will be discharged from the PACU to home when recovered from anesthesia and after chest x-ray is reviewed. We will review the cytology, pathology and microbiology results with the patient when they become available. Outpatient followup will be with Dr. Chris and CANDIE Lites.    Lamar Chris, MD, PhD 12/31/2023, 10:43 AM [facility] [phone] or if no answer before 7:00PM call  [phone] For any issues after 7:00PM please call eLink [phone]

## 2023-12-31 NOTE — Telephone Encounter (Signed)
 Chest x-ray reviewed with radiology 1/4.  May be a small 5-10% right apical pneumothorax.  There is a clear line present although I think I can see lung markings superior to it.  Called and spoke with the patient and she feels well, no chest discomfort, no dyspnea.  I have instructed her to come to the office for chest x-ray 1/7 to look for interval enlargement, resolution.  Will place that order now.  She understands that if she develops dyspnea, chest discomfort that she needs to go be seen urgently in either the ER or urgent care to have a repeat chest x-ray done.  I will review her chest x-ray 1/7 and we will discuss.

## 2023-12-31 NOTE — Anesthesia Procedure Notes (Signed)
 Procedure Name: Intubation Date/Time: 12/31/2023 9:35 AM  Performed by: Mannie Krystal LABOR, CRNAPre-anesthesia Checklist: Patient identified, Emergency Drugs available, Suction available and Patient being monitored Patient Re-evaluated:Patient Re-evaluated prior to induction Oxygen Delivery Method: Circle system utilized Preoxygenation: Pre-oxygenation with 100% oxygen Induction Type: IV induction Ventilation: Mask ventilation without difficulty Laryngoscope Size: Mac and 4 Grade View: Grade I Tube type: Oral Tube size: 8.5 mm Number of attempts: 1 Airway Equipment and Method: Stylet and Oral airway Placement Confirmation: ETT inserted through vocal cords under direct vision, positive ETCO2 and breath sounds checked- equal and bilateral Secured at: 22 cm Tube secured with: Tape Dental Injury: Teeth and Oropharynx as per pre-operative assessment

## 2023-12-31 NOTE — Transfer of Care (Signed)
 Immediate Anesthesia Transfer of Care Note  Patient: Karen Bonilla  Procedure(s) Performed: ROBOTIC ASSISTED NAVIGATIONAL BRONCHOSCOPY BRONCHIAL BIOPSIES BRONCHIAL BRUSHINGS BRONCHIAL NEEDLE ASPIRATION BIOPSIES FIDUCIAL MARKER PLACEMENT BRONCHIAL WASHINGS  Patient Location: PACU  Anesthesia Type:General  Level of Consciousness: drowsy  Airway & Oxygen Therapy: Patient Spontanous Breathing and Patient connected to face mask oxygen  Post-op Assessment: Report given to RN and Post -op Vital signs reviewed and stable  Post vital signs: Reviewed and stable  Last Vitals:  Vitals Value Taken Time  BP 134/61 12/31/23 1049  Temp 36.6 C 12/31/23 1049  Pulse 87 12/31/23 1056  Resp 16 12/31/23 1056  SpO2 98 % 12/31/23 1056  Vitals shown include unfiled device data.  Last Pain:  Vitals:   12/31/23 1049  TempSrc:   PainSc: 0-No pain      Patients Stated Pain Goal: 0 (12/31/23 0707)  Complications: No notable events documented.

## 2024-01-01 ENCOUNTER — Ambulatory Visit: Payer: Medicare PPO

## 2024-01-01 DIAGNOSIS — J939 Pneumothorax, unspecified: Secondary | ICD-10-CM | POA: Diagnosis not present

## 2024-01-01 DIAGNOSIS — J95811 Postprocedural pneumothorax: Secondary | ICD-10-CM

## 2024-01-01 LAB — CYTOLOGY - NON PAP

## 2024-01-01 NOTE — Telephone Encounter (Signed)
 Reviewed patient's chest x-ray she still has a right apical pneumothorax, slightly smaller than on 1/6.  She continues to feel well with minimal chest discomfort and no shortness of breath.  She knows to go to the ED if that changes.  We reviewed her transbronchial biopsy results, these show nonnecrotizing granulomas, no evidence of malignant cells.  She has follow-up on 1/14 in our office.

## 2024-01-01 NOTE — Anesthesia Postprocedure Evaluation (Signed)
 Anesthesia Post Note  Patient: Karen Bonilla  Procedure(s) Performed: ROBOTIC ASSISTED NAVIGATIONAL BRONCHOSCOPY BRONCHIAL BIOPSIES BRONCHIAL BRUSHINGS BRONCHIAL NEEDLE ASPIRATION BIOPSIES FIDUCIAL MARKER PLACEMENT BRONCHIAL WASHINGS     Patient location during evaluation: PACU Anesthesia Type: General Level of consciousness: awake and alert Pain management: pain level controlled Vital Signs Assessment: post-procedure vital signs reviewed and stable Respiratory status: spontaneous breathing, nonlabored ventilation, respiratory function stable and patient connected to nasal cannula oxygen Cardiovascular status: blood pressure returned to baseline and stable Postop Assessment: no apparent nausea or vomiting Anesthetic complications: no  No notable events documented.  Last Vitals:  Vitals:   12/31/23 1130 12/31/23 1145  BP: (!) 144/69 (!) 140/61  Pulse: 69 68  Resp: 13 10  Temp:  36.7 C  SpO2: 94% 94%    Last Pain:  Vitals:   12/31/23 1145  TempSrc:   PainSc: 0-No pain                 Zakara Parkey L Yolette Hastings

## 2024-01-02 ENCOUNTER — Encounter (HOSPITAL_COMMUNITY): Payer: Self-pay | Admitting: Emergency Medicine

## 2024-01-02 LAB — CYTOLOGY - NON PAP

## 2024-01-08 ENCOUNTER — Encounter: Payer: Self-pay | Admitting: Acute Care

## 2024-01-08 ENCOUNTER — Ambulatory Visit: Payer: Medicare PPO | Admitting: Acute Care

## 2024-01-08 ENCOUNTER — Ambulatory Visit (INDEPENDENT_AMBULATORY_CARE_PROVIDER_SITE_OTHER): Payer: Medicare PPO

## 2024-01-08 VITALS — BP 150/70 | HR 53 | Temp 97.7°F | Ht 66.5 in | Wt 138.2 lb

## 2024-01-08 DIAGNOSIS — F17210 Nicotine dependence, cigarettes, uncomplicated: Secondary | ICD-10-CM | POA: Diagnosis not present

## 2024-01-08 DIAGNOSIS — J95811 Postprocedural pneumothorax: Secondary | ICD-10-CM

## 2024-01-08 DIAGNOSIS — R911 Solitary pulmonary nodule: Secondary | ICD-10-CM | POA: Diagnosis not present

## 2024-01-08 DIAGNOSIS — I700 Atherosclerosis of aorta: Secondary | ICD-10-CM | POA: Diagnosis not present

## 2024-01-08 NOTE — Progress Notes (Signed)
 History of Present Illness Karen Bonilla is a 83 y.o. female current every day smoker ( 67 pack years) followed through the lung cancer screening program, followed for an abnormal lung cancer screening scan done 09/24/2023. He is followed by Dr. Shelah  83 year old active smoker (67 pack years) with coronary artery disease, hyperlipidemia, multiple basal cell and squamous cell carcinomas that have been resected, lumbar sciatica, anemia, diverticular disease. She has participated in lung cancer screening program and had  recent CT scan of the chest done 09/24/2023, followed by PET scan.   The CT scan revealed moderate central lobular emphysematous change and an irregular solid right lower lobe nodule in the paraspinous region, measuring 2.4 by 1.6 cm. This nodule has increased in size compared to the January 2023 scan, where it measured 2.0 by 1.1 cm. A new 3 mm solid left upper lobe nodule was also identified. The PET scan showed mild hypermetabolism in the medial right lower lobe nodule with a max SUV of 3.6. There was no hypermetabolic thoracic lymphadenopathy or evidence of any disease.   She had no significant respiratory symptoms and is able to carry out her day-to-day activities without difficulty. She has an albuterol  inhaler, which she uses infrequently when she feels short of breath. She reports no significant exposure to inhaled substances other than tobacco. She lives alone. She has tolerated anesthesia in the past without problems.  She underwent bronchoscopy with biopsies 1.6.2025 by Dr. Shelah.    01/08/2024 Pt. Presents for follow up after bronchoscopy with biopsies on 12/31/2023 by Dr. Shelah. She states she feels well. No shortness of breath or chest pain. She did have a small right apical pneumothorax, post procedure  on 12/31/2023. She came to the office and had a CXR 01/01/2024. This showed right apical pneumothorax, slightly smaller than on 1/6. She states she has had no further issues.  The  chest pain that she did initially have after the procedure has since resolved we will get a CXR today to ensure complete resolution of the pneumothorax.   We have already reviewed the results of her cytology.  This showed that the right lower lobe pulmonary right lower lobe fine-needle aspiration  showed no malignant cells, per cytology there were nonnecrotizing granulomas and chronic inflammation.  Likewise the right lower lobe brushing was negative for malignant cells.  Patient was very happy to hear this news.  We will continue to monitor this area with a 37-month follow-up CT chest that will be due April 2025.  Patient is in agreement with this plan.  Patient had no further questions or concerns at completion of the visit.    Test Results: Chest x-ray 01/08/2024 Normal heart size, aortic atherosclerosis.  Medial right lower lobe nodule with fiducial marker.  Lungs otherwise clear.  No pleural effusion.  No pneumothorax. Impression Medial right lower lobe nodule with fiducial marker no pneumothorax  Cytology 12/31/2023 FINAL MICROSCOPIC DIAGNOSIS:  A. LUNG, RLL, FINE NEEDLE ASPIRATION  BIOPSIES:  - No malignant cells identified  - Non- necrotizing granulomas and chronic inflammation   B. LUNG, RLL, BRUSHINGS:  - No malignant cells identified   C. LUNG, RLL, LAVAGE:  FINAL MICROSCOPIC DIAGNOSIS:  - No malignant cells identified   01/01/2024 CXR Small persistent right apical pneumothorax.   12/31/2023 CXR Probable small right apical pneumothorax of roughly 5-10% volume. Right lower lung atelectasis after the procedure.   Chest CT: Moderate central lobular emphysematous change and a 2.4 x 1.6 cm irregular solid right  lower lobe nodule in the paraspinous region, increased in size compared with January 2023 (2.0 x 1.1 cm). New 3 mm solid left upper lobe nodule. (09/24/2023) PET scan: Mild hypermetabolism in the medial right lower lobe nodule with a max SUV 3.6. No hypermetabolic thoracic  lymphadenopathy or evidence of any disease. (11/05/2023)    Latest Ref Rng & Units 12/31/2023    7:05 AM 01/09/2022    2:06 PM  CBC  WBC 4.0 - 10.5 K/uL 4.7  6.3   Hemoglobin 12.0 - 15.0 g/dL 85.3  85.5   Hematocrit 36.0 - 46.0 % 44.1  43.9   Platelets 150 - 400 K/uL 248  288        Latest Ref Rng & Units 12/31/2023    7:05 AM  BMP  Glucose 70 - 99 mg/dL 85   BUN 8 - 23 mg/dL 15   Creatinine 9.55 - 1.00 mg/dL 9.16   Sodium 864 - 854 mmol/L 137   Potassium 3.5 - 5.1 mmol/L 4.1   Chloride 98 - 111 mmol/L 104   CO2 22 - 32 mmol/L 23   Calcium 8.9 - 10.3 mg/dL 9.3     BNP No results found for: BNP  ProBNP No results found for: PROBNP  PFT No results found for: FEV1PRE, FEV1POST, FVCPRE, FVCPOST, TLC, DLCOUNC, PREFEV1FVCRT, PSTFEV1FVCRT  DG Chest 2 View Result Date: 01/06/2024 CLINICAL DATA:  Follow-up pneumothorax. EXAM: CHEST - 2 VIEW COMPARISON:  Chest x-ray 12/31/2023 FINDINGS: The cardiac silhouette, mediastinal and hilar contours are within normal limits and stable. There is a small persistent right apical pneumothorax. No progressive findings. IMPRESSION: Small persistent right apical pneumothorax. Electronically Signed   By: MYRTIS Stammer M.D.   On: 01/06/2024 10:18   CT Super D Chest Wo Contrast Result Date: 01/06/2024 CLINICAL DATA:  Pre EMB for biopsy of a right lower lobe pulmonary lesion. EXAM: CT CHEST WITHOUT CONTRAST TECHNIQUE: Multidetector CT imaging of the chest was performed using thin slice collimation for electromagnetic bronchoscopy planning purposes, without intravenous contrast. RADIATION DOSE REDUCTION: This exam was performed according to the departmental dose-optimization program which includes automated exposure control, adjustment of the mA and/or kV according to patient size and/or use of iterative reconstruction technique. COMPARISON:  PET-CT 11/05/2023 FINDINGS: Cardiovascular: The heart is normal in size. No pericardial effusion. The  aorta is normal in caliber. Stable atherosclerotic calcification. Stable three-vessel coronary artery calcifications. Mediastinum/Nodes: No mediastinal or hilar mass or lymphadenopathy. The esophagus is unremarkable. Small hiatal hernia. Lungs/Pleura: Underlying emphysematous changes and pulmonary scarring. No acute pulmonary process. Stable 2 cm medial right lower lobe pulmonary lesion containing a few calcifications and some areas of low attenuation but no discrete fat. Findings could suggest a granulomatous process. This showed low level hypermetabolism the PET-CT. No pleural effusion or pleural lesions. Upper Abdomen: No significant upper abdominal findings. No hepatic or adrenal gland lesions. Stable vascular calcifications. Musculoskeletal: No breast masses, supraclavicular or axillary adenopathy. The bony thorax is intact. Age related degenerative changes and osteopenia involving the spine. IMPRESSION: 1. Stable 2 cm medial right lower lobe pulmonary lesion containing a few calcifications. 2. No mediastinal or hilar mass or adenopathy. 3. Underlying emphysematous changes and pulmonary scarring. Aortic Atherosclerosis (ICD10-I70.0) and Emphysema (ICD10-J43.9). Electronically Signed   By: MYRTIS Stammer M.D.   On: 01/06/2024 10:17   DG Chest Port 1 View Result Date: 12/31/2023 CLINICAL DATA:  Status post bronchoscopy for right pulmonary nodule. EXAM: PORTABLE CHEST 1 VIEW COMPARISON:  Prior CT and PET scan.  FINDINGS: Normal heart size. Superior to a probable skin fold overlying the right upper chest is a probable small right apical pneumothorax of roughly 5-10% volume. Right lower lung atelectasis present after the procedure. There is a fiducial marker at the level of the medial right lung base. No pleural fluid. Underlying emphysematous lung disease present. IMPRESSION: Probable small right apical pneumothorax of roughly 5-10% volume. Right lower lung atelectasis after the procedure. Electronically Signed    By: Marcey Moan M.D.   On: 12/31/2023 12:36   DG C-ARM BRONCHOSCOPY Result Date: 12/31/2023 C-ARM BRONCHOSCOPY: Fluoroscopy was utilized by the requesting physician.  No radiographic interpretation.   DG C-Arm 1-60 Min-No Report Result Date: 12/31/2023 Fluoroscopy was utilized by the requesting physician.  No radiographic interpretation.     Past medical hx Past Medical History:  Diagnosis Date   Abnormal chest CT    Anemia    Anosmia    Aortic atherosclerosis (HCC)    Arthritis    Back pain of lumbar region with sciatica    Basal cell carcinoma 09/01/2002   right shin tx exc   BCC (basal cell carcinoma of skin) 06/20/2013   right forearm TX WITH BX   BCC (basal cell carcinoma of skin) 06/20/2013   right upper lip TX WITH BX   BCC (basal cell carcinoma of skin) 10/03/2016   mid upper back TX WITH BX   CAD (coronary artery disease)    Colon polyps    COPD (chronic obstructive pulmonary disease) (HCC)    DDD (degenerative disc disease), lumbar    Diverticulosis large intestine w/o perforation or abscess w/o bleeding    Emphysema, unspecified (HCC)    History of colonic polyps    Hypercholesterolemia    Hyperlipidemia    Inguinal hernia    Mucoid cyst of joint    SCC (squamous cell carcinoma) 01/23/2011   left forearm scc/ka  TX CX3 5FU CAUTERY   SCC (squamous cell carcinoma) 10/03/2016   right shin scc/ka TX CX3 5FU CAUTERY   SCC (squamous cell carcinoma) 07/29/2019   left forehead scc TX CX3 5FU CAUTERY   Scoliosis of lumbar spine, unspecified scoliosis type    Seasonal allergies    Senile purpura (HCC)    Spinal stenosis    Spinal stenosis    Squamous cell carcinoma of skin 10/23/2005   left lower shin inf. bowens  TX CX3 5FU CAUTERY   Squamous cell carcinoma of skin 08/06/2020   in situ- right lower leg ([id number])   Squamous cell carcinoma of skin 08/06/2020   in situ- right lower leg- posterior lower ([id number])   Varicose veins of lower extremities with other  complications 05/21/2012     Social History   Tobacco Use   Smoking status: Every Day    Current packs/day: 1.00    Average packs/day: 1 pack/day for 67.0 years (67.0 ttl pk-yrs)    Types: Cigarettes    Start date: 1958   Smokeless tobacco: Never   Tobacco comments:    Smoking less than a pack per day.  She does not inhale the smoke.  12/04/2023.  Has been smoking for 65 years.  Not trying to quit.  Vaping Use   Vaping status: Never Used  Substance Use Topics   Alcohol  use: Yes    Alcohol /week: 7.0 standard drinks of alcohol     Types: 7 Glasses of wine per week    Comment: WINE NIGHTLY WITH DINNER   Drug use: No    Ms.Leisure reports  that she has been smoking cigarettes. She started smoking about 67 years ago. She has a 67 pack-year smoking history. She has never used smokeless tobacco. She reports current alcohol  use of about 7.0 standard drinks of alcohol  per week. She reports that she does not use drugs.  Tobacco Cessation: Current everyday smoker with a 67-pack-year smoking history. Patient is currently not working on smoking cessation  Past surgical hx, Family hx, Social hx all reviewed.  Current Outpatient Medications on File Prior to Visit  Medication Sig   albuterol  (PROVENTIL  HFA;VENTOLIN  HFA) 108 (90 BASE) MCG/ACT inhaler Inhale into the lungs every 6 (six) hours as needed for wheezing or shortness of breath.   alendronate (FOSAMAX) 70 MG tablet Take 70 mg by mouth once a week. Takes on Sunday   calcium carbonate (TUMS - DOSED IN MG ELEMENTAL CALCIUM) 500 MG chewable tablet Chew 1 tablet by mouth daily as needed.   cholecalciferol  (VITAMIN D ) 25 MCG (1000 UNIT) tablet Take 1,000 Units by mouth at bedtime.   ibuprofen  (ADVIL ) 200 MG tablet Take 200 mg by mouth every 6 (six) hours as needed. 1 tablet daily as needed.   Polyethyl Glycol-Propyl Glycol (SYSTANE) 0.4-0.3 % SOLN Place 1-2 drops into both eyes 2 (two) times daily as needed (dry/irritated eyes.).   pravastatin   (PRAVACHOL ) 20 MG tablet Take 20 mg by mouth at bedtime.   traMADol  (ULTRAM ) 50 MG tablet Take 1 tablet (50 mg total) by mouth every 6 (six) hours as needed for moderate pain or severe pain.   No current facility-administered medications on file prior to visit.     No Known Allergies  Review Of Systems:  Constitutional:   No  weight loss, night sweats,  Fevers, chills, fatigue, or  lassitude.  HEENT:   No headaches,  Difficulty swallowing,  Tooth/dental problems, or  Sore throat,                No sneezing, itching, ear ache, nasal congestion, post nasal drip,   CV:  No chest pain,  Orthopnea, PND, swelling in lower extremities, anasarca, dizziness, palpitations, syncope.   GI  No heartburn, indigestion, abdominal pain, nausea, vomiting, diarrhea, change in bowel habits, loss of appetite, bloody stools.   Resp: No shortness of breath with exertion or at rest.  No excess mucus, no productive cough,  No non-productive cough,  No coughing up of blood.  No change in color of mucus.  No wheezing.  No chest wall deformity  Skin: no rash or lesions.  GU: no dysuria, change in color of urine, no urgency or frequency.  No flank pain, no hematuria   MS:  No joint pain or swelling.  No decreased range of motion.  No back pain.  Psych:  No change in mood or affect. No depression or anxiety.  No memory loss.   Vital Signs BP (!) 144/68 (BP Location: Right Arm, Cuff Size: Normal)   Pulse (!) 53   Temp 97.7 F (36.5 C) (Oral)   Ht 5' 6.5 (1.689 m)   Wt 138 lb 3.2 oz (62.7 kg)   SpO2 99%   BMI 21.97 kg/m    Physical Exam:  General- No distress,  A&Ox3, pleasant and appropriate ENT: No sinus tenderness, TM clear, pale nasal mucosa, no oral exudate,no post nasal drip, no LAN Cardiac: S1, S2, regular rate and rhythm, no murmur Chest: No wheeze/ rales/ dullness; no accessory muscle use, no nasal flaring, no sternal retractions Abd.: Soft Non-tender, nondistended, bowel sounds  positive,Body mass index is 21.97 kg/m.  Ext: No clubbing cyanosis, edema Neuro:  normal strength, moving all extremities x 4, alert and oriented x 3 Skin: No rashes, warm and dry, no obvious lesions Psych: normal mood and behavior   Assessment/Plan Post bronchoscopy with biopsies Biopsies negative for malignant cells Right apical pneumothorax postprocedure which has subsequently resolved on imaging 01/08/2024 Current everyday smoker Plan Your biopsies are negative for malignancy.  This is great news. We will do a 3 month follow up CT Chest  to ensure the nodule of concern is stable. You will get a call to get this scheduled closer to the time You will follow up with me in the office after to review the results. We will do a CXR today to ensure the small pneumothorax noted after your procedure has resolved.  I will call you with the results. Please work on quitting smoking Call us  for any shortness of breath that is worse than your baseline, or chest pain, or seek emergency care. Please contact office for sooner follow up if symptoms do not improve or worsen or seek emergency care     I spent 25 minutes dedicated to the care of this patient on the date of this encounter to include pre-visit review of records, face-to-face time with the patient discussing conditions above, post visit ordering of testing, clinical documentation with the electronic health record, making appropriate referrals as documented, and communicating necessary information to the patient's healthcare team.    Lauraine JULIANNA Lites, NP 01/08/2024  10:30 AM

## 2024-01-08 NOTE — Patient Instructions (Addendum)
 It is good to see you today. Your biopsies are negative for malignancy.  This is great news. We will do a 3 month follow up CT Chest  to ensure the nodule of concern is stable. You will get a call to get this scheduled closer to the time You will follow up with me in the office after to review the results. We will do a CXR today to ensure the small pneumothorax noted after your procedure has resolved.  I will call you with the results. Please work on quitting smoking Call us  for any shortness of breath that is worse than your baseline, or chest pain, or seek emergency care. Please contact office for sooner follow up if symptoms do not improve or worsen or seek emergency care

## 2024-02-22 DIAGNOSIS — R030 Elevated blood-pressure reading, without diagnosis of hypertension: Secondary | ICD-10-CM | POA: Diagnosis not present

## 2024-02-22 DIAGNOSIS — E7800 Pure hypercholesterolemia, unspecified: Secondary | ICD-10-CM | POA: Diagnosis not present

## 2024-02-22 DIAGNOSIS — I499 Cardiac arrhythmia, unspecified: Secondary | ICD-10-CM | POA: Diagnosis not present

## 2024-02-22 DIAGNOSIS — J449 Chronic obstructive pulmonary disease, unspecified: Secondary | ICD-10-CM | POA: Diagnosis not present

## 2024-02-22 DIAGNOSIS — M545 Low back pain, unspecified: Secondary | ICD-10-CM | POA: Diagnosis not present

## 2024-02-22 DIAGNOSIS — M8588 Other specified disorders of bone density and structure, other site: Secondary | ICD-10-CM | POA: Diagnosis not present

## 2024-02-22 DIAGNOSIS — F17200 Nicotine dependence, unspecified, uncomplicated: Secondary | ICD-10-CM | POA: Diagnosis not present

## 2024-02-27 DIAGNOSIS — M25512 Pain in left shoulder: Secondary | ICD-10-CM | POA: Diagnosis not present

## 2024-02-27 DIAGNOSIS — G8929 Other chronic pain: Secondary | ICD-10-CM | POA: Diagnosis not present

## 2024-02-27 DIAGNOSIS — S3091XA Unspecified superficial injury of lower back and pelvis, initial encounter: Secondary | ICD-10-CM | POA: Diagnosis not present

## 2024-02-27 DIAGNOSIS — R197 Diarrhea, unspecified: Secondary | ICD-10-CM | POA: Diagnosis not present

## 2024-02-27 DIAGNOSIS — W19XXXA Unspecified fall, initial encounter: Secondary | ICD-10-CM | POA: Diagnosis not present

## 2024-03-10 DIAGNOSIS — R29890 Loss of height: Secondary | ICD-10-CM | POA: Diagnosis not present

## 2024-03-10 DIAGNOSIS — M8588 Other specified disorders of bone density and structure, other site: Secondary | ICD-10-CM | POA: Diagnosis not present

## 2024-03-19 ENCOUNTER — Ambulatory Visit: Attending: Family Medicine

## 2024-03-19 ENCOUNTER — Other Ambulatory Visit: Payer: Self-pay

## 2024-03-19 DIAGNOSIS — M6281 Muscle weakness (generalized): Secondary | ICD-10-CM | POA: Insufficient documentation

## 2024-03-19 DIAGNOSIS — R2689 Other abnormalities of gait and mobility: Secondary | ICD-10-CM | POA: Diagnosis not present

## 2024-03-19 NOTE — Therapy (Signed)
 OUTPATIENT PHYSICAL THERAPY NEURO EVALUATION   Patient Name: Karen Bonilla MRN: [id number] DOB:07/30/1941, 83 y.o., female Today's Date: 03/19/2024   PCP: Jackelyn Poling, DO REFERRING PROVIDER: Jackelyn Poling, DO  END OF SESSION:  PT End of Session - 03/19/24 1450     Visit Number 1    Number of Visits 5    Date for PT Re-Evaluation 04/16/24    Authorization Type [employer]    Authorization Time Period auth required    PT Start Time 1445    PT Stop Time 1530    PT Time Calculation (min) 45 min             Past Medical History:  Diagnosis Date   Abnormal chest CT    Anemia    Anosmia    Aortic atherosclerosis (HCC)    Arthritis    Back pain of lumbar region with sciatica    Basal cell carcinoma 09/01/2002   right shin tx exc   BCC (basal cell carcinoma of skin) 06/20/2013   right forearm TX WITH BX   BCC (basal cell carcinoma of skin) 06/20/2013   right upper lip TX WITH BX   BCC (basal cell carcinoma of skin) 10/03/2016   mid upper back TX WITH BX   CAD (coronary artery disease)    Colon polyps    COPD (chronic obstructive pulmonary disease) (HCC)    DDD (degenerative disc disease), lumbar    Diverticulosis large intestine w/o perforation or abscess w/o bleeding    Emphysema, unspecified (HCC)    History of colonic polyps    Hypercholesterolemia    Hyperlipidemia    Inguinal hernia    Mucoid cyst of joint    SCC (squamous cell carcinoma) 01/23/2011   left forearm scc/ka  TX CX3 5FU CAUTERY   SCC (squamous cell carcinoma) 10/03/2016   right shin scc/ka TX CX3 5FU CAUTERY   SCC (squamous cell carcinoma) 07/29/2019   left forehead scc TX CX3 5FU CAUTERY   Scoliosis of lumbar spine, unspecified scoliosis type    Seasonal allergies    Senile purpura (HCC)    Spinal stenosis    Spinal stenosis    Squamous cell carcinoma of skin 10/23/2005   left lower shin inf. bowens  TX CX3 5FU CAUTERY   Squamous cell carcinoma of skin 08/06/2020   in situ- right  lower leg (CX35FU)   Squamous cell carcinoma of skin 08/06/2020   in situ- right lower leg- posterior lower (CX35FU)   Varicose veins of lower extremities with other complications 05/21/2012   Past Surgical History:  Procedure Laterality Date   ABDOMINAL HYSTERECTOMY     APPENDECTOMY     BLEPHAROPLASTY     BRONCHIAL BIOPSY  12/31/2023   Procedure: BRONCHIAL BIOPSIES;  Surgeon: Leslye Peer, MD;  Location: [facility] ENDOSCOPY;  Service: Pulmonary;;   BRONCHIAL BRUSHINGS  12/31/2023   Procedure: BRONCHIAL BRUSHINGS;  Surgeon: Leslye Peer, MD;  Location: [facility] ENDOSCOPY;  Service: Pulmonary;;   BRONCHIAL NEEDLE ASPIRATION BIOPSY  12/31/2023   Procedure: BRONCHIAL NEEDLE ASPIRATION BIOPSIES;  Surgeon: Leslye Peer, MD;  Location: [facility] ENDOSCOPY;  Service: Pulmonary;;   BRONCHIAL WASHINGS  12/31/2023   Procedure: BRONCHIAL WASHINGS;  Surgeon: Leslye Peer, MD;  Location: [facility] ENDOSCOPY;  Service: Pulmonary;;   COLONOSCOPY  2024   FIDUCIAL MARKER PLACEMENT  12/31/2023   Procedure: FIDUCIAL MARKER PLACEMENT;  Surgeon: Leslye Peer, MD;  Location: [facility] ENDOSCOPY;  Service: Pulmonary;;   INGUINAL HERNIA REPAIR Bilateral  01/20/2022   Procedure: LAPAROSCOPIC BILATERAL INGUINAL HERNIA REPAIR WITH MESH;  Surgeon: Kinsinger, De Blanch, MD;  Location: [facility] ORS;  Service: General;  Laterality: Bilateral;   MASS EXCISION  01/11/2012   Procedure: MINOR EXCISION OF MASS;  Surgeon: Wyn Forster., MD;  Location: [facility];  Service: Orthopedics;  Laterality: Right;  excision mucoid cyst right index and dip joint debridement   TONSILLECTOMY     Patient Active Problem List   Diagnosis Date Noted   Pulmonary nodules 12/04/2023   Tobacco use 12/04/2023   Bilateral inguinal hernia 01/20/2022   Anosmia 02/02/2021   Degeneration of lumbar intervertebral disc 02/02/2021   Hardening of the aorta (main artery of the heart) (HCC) 02/02/2021   Hypercholesterolemia 02/02/2021   Other seasonal allergic  rhinitis 02/02/2021   Other specified disorders of bone density and structure, other site 02/02/2021   History of colonic polyps 02/02/2021   Chronic obstructive pulmonary disease (HCC) 02/02/2021   Senile purpura (HCC) 02/02/2021   Spinal stenosis 02/02/2021   Abnormal findings on diagnostic imaging of other specified body structures 02/02/2021   Coronary artery calcification 03/19/2018   Ganglion of left wrist 03/07/2017   Varicose veins of bilateral lower extremities with other complications 05/21/2012    ONSET DATE: 2-3 weeks ago (fall)  REFERRING DIAG:  W19.XXXA (ICD-10-CM) - Unspecified fall, initial encounter    THERAPY DIAG:  Other abnormalities of gait and mobility  Muscle weakness (generalized)  Rationale for Evaluation and Treatment: Rehabilitation  SUBJECTIVE:                                                                                                                                                                                             SUBJECTIVE STATEMENT: Fall 2-3 weeks ago (reports no recollection of the event) but came to and on the floor and unable to arise. Called EMS for assistance and checked her out. Pt reports she has been apprehensive about getting down to the floor since this episode.  Pt enjoys gardening and doing her housekeeping and this is an essential task for her. She has significant scoliosis, lumbar stenosis, and experiencing referred pain into LLE > RLE but no worse from recent incident. Some use of a cane when first arising in the morning.  Pt accompanied by: self  PERTINENT HISTORY: 83 year old active smoker (67 pack years) with coronary artery disease, hyperlipidemia, multiple basal cell and squamous cell carcinomas that have been resected, lumbar sciatica, anemia, diverticular disease. She has participated in lung cancer screening program and had  recent CT scan of the chest done 09/24/2023, followed by PET scan.  PAIN:  Are you having  pain?  Usual aches  PRECAUTIONS: None  RED FLAGS: None   WEIGHT BEARING RESTRICTIONS: No  FALLS: Has patient fallen in last 6 months? Yes. Number of falls 1  LIVING ENVIRONMENT: Lives with: lives alone Lives in: House/apartment Stairs: Yes: External: 7 steps; bilateral but cannot reach both Has following equipment at home: Single point cane, RTS,   PLOF: Independent, attends water aerobics  PATIENT GOALS: be able to get up and down from ground, improve strength in legs  OBJECTIVE:  Note: Objective measures were completed at Evaluation unless otherwise noted.  DIAGNOSTIC FINDINGS: reports recent bone density scan (reports she has osteopenia)  COGNITION: Overall cognitive status: Within functional limits for tasks assessed   SENSATION: Light touch: WFL  COORDINATION: WNL    POSTURE: flexed trunk  and scoliosis   LOWER EXTREMITY ROM:    WFL, some limitation to left hip external rotation (seated figure 4)  LOWER EXTREMITY MMT:    MMT Right Eval Left Eval  Hip flexion 4 4  Hip extension    Hip abduction 4 4  Hip adduction 5 5  Hip internal rotation    Hip external rotation    Knee flexion 5 5  Knee extension 5 5  Ankle dorsiflexion 5 5  Ankle plantarflexion    Ankle inversion    Ankle eversion    (Blank rows = not tested)  BED MOBILITY:  independent  TRANSFERS: Assistive device utilized: None  Sit to stand: Modified independence and push to stand Stand to sit: Complete Independence Chair to chair: Modified independence Floor: Modified independence--dependent on external support, trials with mat  table for UE support and repeated with gardening stool   CURB:  NT  STAIRS: Level of Assistance: Modified independence Stair Negotiation Technique: Alternating Pattern  with Bilateral Rails Number of Stairs:   Height of Stairs:   Comments:   GAIT: Gait pattern: WFL Distance walked:  Assistive device utilized: None Level of assistance: Complete  Independence Comments:   FUNCTIONAL TESTS:  5 times sit to stand: 17.91 sec w/ UE push-off Timed up and go (TUG): NT Berg Balance Scale: 48/56  [facility] PT Assessment - 03/19/24 0001       Standardized Balance Assessment   Standardized Balance Assessment Berg Balance Test      Berg Balance Test   Sit to Stand Able to stand  independently using hands    Standing Unsupported Able to stand safely 2 minutes    Sitting with Back Unsupported but Feet Supported on Floor or Stool Able to sit safely and securely 2 minutes    Stand to Sit Sits safely with minimal use of hands    Transfers Able to transfer safely, minor use of hands    Standing Unsupported with Eyes Closed Able to stand 10 seconds safely    Standing Unsupported with Feet Together Able to place feet together independently and stand 1 minute safely    From Standing, Reach Forward with Outstretched Arm Can reach confidently >25 cm (10")    From Standing Position, Pick up Object from Floor Able to pick up shoe safely and easily    From Standing Position, Turn to Look Behind Over each Shoulder Looks behind one side only/other side shows less weight shift    Turn 360 Degrees Able to turn 360 degrees safely but slowly    Standing Unsupported, Alternately Place Feet on Step/Stool Able to stand independently and safely and complete 8 steps in 20 seconds  Standing Unsupported, One Foot in Front Able to take small step independently and hold 30 seconds    Standing on One Leg Able to lift leg independently and hold equal to or more than 3 seconds    Total Score 48             M-CTSIB  Condition 1: Firm Surface, EO 30 Sec, Normal Sway  Condition 2: Firm Surface, EC 30 Sec, Normal Sway  Condition 3: Foam Surface, EO  Sec,     Sway  Condition 4: Foam Surface, EC  Sec,     Sway                                                                                                                                 TREATMENT DATE: 03/19/24     PATIENT EDUCATION: Education details: assessment details, HEP initiation Person educated: Patient Education method: Explanation and Handouts Education comprehension: verbalized understanding  HOME EXERCISE PROGRAM: Access Code: [email or web address] URL: [email or web address] Date: 03/19/2024 Prepared by: Shary Decamp  Exercises - Sit to Stand with Resistance Around Legs  - 1 x daily - 2-3 x weekly - 3 sets - 10 reps - Side Stepping with Resistance at Ankles and Counter Support  - 1 x daily - 2-3 x weekly - 1-3 sets - 1-2 min hold  GOALS: Goals reviewed with patient? Yes  SHORT TERM GOALS: Target date: same as LTG   LONG TERM GOALS: Target date: 04/16/2024    Patient will be independent in HEP to improve functional outcomes Baseline:  Goal status: INITIAL  2.  Demo improved BLE strength completing 5xSTS test in 15 sec w/out UE push-off Baseline: 17 sec w/ push-off Goal status: INITIAL  3.  Demo improved mobility/LE strength as evidenced by ability to perform kneeling/lunge to standing to improve ability to perform housekeeping and gardening activities Baseline: needs UE support Goal status: INITIAL   ASSESSMENT:  CLINICAL IMPRESSION: Patient is a 83 y.o. lady who was seen today for physical therapy evaluation and treatment for Unspecified fall, initial encounter.  Demonstrates LE weakness and reduced mobility as compared to her PLOF with difficulty in lowering to floor/ground and arising limiting her usual activity participation of housekeeping and gardening.  Thankfully, demonstrates good performance to fall risk measures with score 48/56 Berg Balance Test indicating low risk for falls but increased LE weakness/fall risk per time on 5xSTS test.  Patient would benefit from PT services to address deficits and limitations to restore capabilities to PLOF   OBJECTIVE IMPAIRMENTS: decreased activity tolerance, decreased mobility, and decreased strength.   ACTIVITY  LIMITATIONS: squatting and transfers  PARTICIPATION LIMITATIONS: cleaning and yard work  PERSONAL FACTORS: Age and 1 comorbidity: musculoskeletal issues (LBP, scoliosis, stenosis)  are also affecting patient's functional outcome.   REHAB POTENTIAL: Excellent  CLINICAL DECISION MAKING: Stable/uncomplicated  EVALUATION COMPLEXITY: Low  PLAN:  PT FREQUENCY: 1x/week  PT DURATION: 4 weeks  PLANNED INTERVENTIONS: 97110-Therapeutic exercises,  91478- Therapeutic activity, [id number]- Neuromuscular re-education, [id number]- Self Care, 29562- Manual therapy, [id number]- Gait training, and DME instructions  PLAN FOR NEXT SESSION: lunge/kneel to stand trials for HEP?   4:19 PM, 03/19/24 M. Shary Decamp, PT, DPT Physical Therapist- [place] Office Number: [phone]   Referring diagnosis? Unspecified fall, initial encounter Treatment diagnosis? (if different than referring diagnosis) Other abnormalities of gait and mobility  Muscle weakness (generalized) What was this (referring dx) caused by? []  Surgery [x]  Fall []  Ongoing issue []  Arthritis []  Other: ____________  Laterality: []  Rt []  Lt [x]  Both  Check all possible CPT codes:  *CHOOSE 10 OR LESS*    See Planned Interventions listed in the Plan section of the Evaluation.

## 2024-03-19 NOTE — Progress Notes (Signed)
 Cardiology Office Note:  .   Date:  03/24/2024  ID:  Karen Bonilla, DOB Sep 30, 1941, MRN [id number] PCP: Jackelyn Poling, DO  [facility] Providers Cardiologist:  Lance Muss, MD    Patient Profile: .      PMH Carotid artery disease Coronary calcification on CT Aortic atherosclerosis Emphysema Tobacco abuse Mild calcification of aortic valve Hyperlipidemia  She underwent lung cancer screening CT which revealed coronary and aortic valve calcifications in February 2019.  She had been seen by Dr. Eldridge Dace years prior.  She underwent stress test in 2011 that was low risk, EF 75%, excellent exercise capacity.  Between 2011 and 2019 she lost 20 pounds.  Evidence of minimal carotid artery disease bilaterally in 2010.  Abdominal aorta screening due to tobacco history with no evidence of AAA.  Seen 03/13/2022 by Dr. Eldridge Dace.  She remained active with water aerobics but was feeling poorly due to bronchitis at that time.  Due to lack of symptoms, no additional ischemia evaluation was recommended based on recent CT results revealing coronary calcification.  She had stopped aspirin due to easy bruising.  He advised her to take at least 3 days/week.  She was encouraged to stop smoking and follow heart healthy diet.  Seen in clinic by me on 03/15/2023 at which time her BP was elevated at 142/64 and 142/84 and had been for the previous few days. She admitted to eating bacon and potato chips frequently. She had not been doing water aerobics for the past week due to the heater being broken in the pool. Concerned about her lower extremities being reddish in color.  She had a sister who is 68 years younger who recently underwent endarterectomy for carotid occlusion.  She was advised to monitor BP for 2 weeks and report if consistently > 140/80 in order to start antihypertensive therapy. LDL was 69 on labs completed 07/2022.  She unfortunately continued to smoke which again we encouraged complete cessation.   She underwent vascular screening due to family history and tobacco use.  Vascular screen completed 04/18/2023 revealed no AAA, mild carotid artery disease, atherosclerosis throughout aorta, and normal ABIs.        History of Present Illness: .   Karen Bonilla is a very pleasant 83 y.o. female who is here today for follow-up of coronary calcification. Reports she is overall doing well but suffers from chronic back pain. She describes a recent episode of being down on her floor and not being able to get up because of leg weakness. She had to call EMTs to help her get up. No other symptoms at the time and all vitals were normal. No repeat occurrence. Seen by PT last week for the first time and was able to get herself up from the floor with the help of a device. She has home exercises recommended by her physical therapist, but finds them challenging due to her back pain. She also reports chronic redness in her lower legs, which she has had for years, and occasional leg pain that she believes is related to her back issues. She denies chest pain, lightheadedness, edema, palpitations, orthopnea, PND, presyncope or syncope. She has chronic shortness of breath secondary to COPD that she feels is stable. Home BP is well controlled   Discussed the use of AI scribe software for clinical note transcription with the patient, who gave verbal consent to proceed.   ROS: See HPI       Studies Reviewed: Marland Kitchen   EKG Interpretation  Date/Time:  Monday March 24 2024 11:23:15 EDT Ventricular Rate:  62 PR Interval:  156 QRS Duration:  84 QT Interval:  452 QTC Calculation: 458 R Axis:   -25  Text Interpretation: Sinus rhythm with Premature atrial complexes Possible Anterior infarct , age undetermined Nonspecific ST abnormality No acute changes Confirmed by Eligha Bridegroom [phone]) on 03/24/2024 11:35:57 AM     No results found for: "LIPOA"   Risk Assessment/Calculations:             Physical Exam:   VS:  BP 128/72    Pulse 62   Ht 5\' 3"  (1.6 m)   Wt 138 lb 6.4 oz (62.8 kg)   SpO2 98%   BMI 24.52 kg/m    Wt Readings from Last 3 Encounters:  03/24/24 138 lb 6.4 oz (62.8 kg)  01/08/24 138 lb 3.2 oz (62.7 kg)  12/31/23 140 lb (63.5 kg)    GEN: Well nourished, well developed in no acute distress NECK: No JVD; No carotid bruits CARDIAC: RRR, no murmurs, rubs, gallops RESPIRATORY:  Diminished breath sounds bilaterally without rales, wheezing or rhonchi  ABDOMEN: Soft, non-tender, non-distended EXTREMITIES:  No edema; No deformity     ASSESSMENT AND PLAN: .    Coronary and aortic atherosclerosis: Aortic atherosclerosis and three-vessel coronary artery calcifications noted on chest CT. She has chronic shortness of breath secondary to COPD but denies chest pain, dyspnea, or other symptoms concerning for angina. EKG reveals nonspecific ST abnormality but no acute concerns. No indication for further ischemic evaluation at this time. LDL is at goal of < 70. Focus on secondary prevention including heart healthy mostly plant based diet avoiding saturated fat, processed foods, simple carbohydrates, and sugar along with aiming for at least 150 minutes of moderate intensity exercise each week. Continue pravastatin.   Hyperlipidemia LDL goal < 70: Lipid panel completed 02/22/2024 with total cholesterol 134, HDL 57, LDL 63, and triglycerides 66.  Well-controlled.  Continue pravastatin.  Leg weakness: Recent episode of leg weakness while attempting to get up from the floor.  Vascular screen 04/18/2023 revealed normal ABIs. She is working with PT. Encouraged continued home workouts to strengthen leg muscles.   Tobacco abuse: Complete cessation advised.         Disposition:1 year with me  Signed, Eligha Bridegroom, NP-C

## 2024-03-24 ENCOUNTER — Ambulatory Visit: Payer: Medicare PPO | Attending: Nurse Practitioner | Admitting: Nurse Practitioner

## 2024-03-24 ENCOUNTER — Encounter: Payer: Self-pay | Admitting: Nurse Practitioner

## 2024-03-24 VITALS — BP 128/72 | HR 62 | Ht 63.0 in | Wt 138.4 lb

## 2024-03-24 DIAGNOSIS — R29898 Other symptoms and signs involving the musculoskeletal system: Secondary | ICD-10-CM | POA: Diagnosis not present

## 2024-03-24 DIAGNOSIS — Z720 Tobacco use: Secondary | ICD-10-CM | POA: Diagnosis not present

## 2024-03-24 DIAGNOSIS — E785 Hyperlipidemia, unspecified: Secondary | ICD-10-CM

## 2024-03-24 DIAGNOSIS — I700 Atherosclerosis of aorta: Secondary | ICD-10-CM | POA: Diagnosis not present

## 2024-03-24 DIAGNOSIS — I2510 Atherosclerotic heart disease of native coronary artery without angina pectoris: Secondary | ICD-10-CM | POA: Diagnosis not present

## 2024-03-24 NOTE — Patient Instructions (Addendum)
 Medication Instructions:   Your physician recommends that you continue on your current medications as directed. Please refer to the Current Medication list given to you today.   *If you need a refill on your cardiac medications before your next appointment, please call your pharmacy*  Lab Work:  None ordered.  If you have labs (blood work) drawn today and your tests are completely normal, you will receive your results only by: MyChart Message (if you have MyChart) OR A paper copy in the mail If you have any lab test that is abnormal or we need to change your treatment, we will call you to review the results.  Testing/Procedures:  None ordered.  Follow-Up: At [facility], you and your health needs are our priority.  As part of our continuing mission to provide you with exceptional heart care, our providers are all part of one team.  This team includes your primary Cardiologist (physician) and Advanced Practice Providers or APPs (Physician Assistants and Nurse Practitioners) who all work together to provide you with the care you need, when you need it.  Your next appointment:   1 year(s)  Provider:   Eligha Bridegroom, NP    We recommend signing up for the patient portal called "MyChart".  Sign up information is provided on this After Visit Summary.  MyChart is used to connect with patients for Virtual Visits (Telemedicine).  Patients are able to view lab/test results, encounter notes, upcoming appointments, etc.  Non-urgent messages can be sent to your provider as well.   To learn more about what you can do with MyChart, go to [email or web address].   Other Instructions  Your physician wants you to follow-up in: 1 year.  You will receive a reminder letter in the mail two months in advance. If you don't receive a letter, please call our office to schedule the follow-up appointment.       1st Floor: - Lobby - Registration  - Pharmacy  - Lab - Cafe  2nd Floor: -  PV Lab - Diagnostic Testing (echo, CT, nuclear med)  3rd Floor: - Vacant  4th Floor: - TCTS (cardiothoracic surgery) - AFib Clinic - Structural Heart Clinic - Vascular Surgery  - Vascular Ultrasound  5th Floor: - HeartCare Cardiology (general and EP) - Clinical Pharmacy for coumadin, hypertension, lipid, weight-loss medications, and med management appointments    Valet parking services will be available as well.

## 2024-03-25 ENCOUNTER — Ambulatory Visit: Attending: Family Medicine | Admitting: Rehabilitative and Restorative Service Providers"

## 2024-03-25 ENCOUNTER — Encounter: Payer: Self-pay | Admitting: Rehabilitative and Restorative Service Providers"

## 2024-03-25 DIAGNOSIS — M6281 Muscle weakness (generalized): Secondary | ICD-10-CM | POA: Diagnosis not present

## 2024-03-25 DIAGNOSIS — R2689 Other abnormalities of gait and mobility: Secondary | ICD-10-CM | POA: Insufficient documentation

## 2024-03-25 DIAGNOSIS — M5459 Other low back pain: Secondary | ICD-10-CM | POA: Diagnosis not present

## 2024-03-25 NOTE — Therapy (Addendum)
 OUTPATIENT PHYSICAL THERAPY NEURO TREATMENT   Patient Name: Karen Bonilla MRN: [id number] DOB:December 08, 1941, 82 y.o., female Today's Date: 03/25/2024   PCP: Jackelyn Poling, DO REFERRING PROVIDER: Jackelyn Poling, DO  END OF SESSION:  PT End of Session - 03/25/24 1101     Visit Number 2    Number of Visits 5    Date for PT Re-Evaluation 04/16/24    Authorization Type [employer]    Authorization Time Period Auth#: Tracking #[id number] , approved 5 PT visits from 03/19/2024-04/16/2024.**  [employer] 2025    PT Start Time 1103    PT Stop Time 1144    PT Time Calculation (min) 41 min    Activity Tolerance Patient tolerated treatment well    Behavior During Therapy WFL for tasks assessed/performed              Past Medical History:  Diagnosis Date   Abnormal chest CT    Anemia    Anosmia    Aortic atherosclerosis (HCC)    Arthritis    Back pain of lumbar region with sciatica    Basal cell carcinoma 09/01/2002   right shin tx exc   BCC (basal cell carcinoma of skin) 06/20/2013   right forearm TX WITH BX   BCC (basal cell carcinoma of skin) 06/20/2013   right upper lip TX WITH BX   BCC (basal cell carcinoma of skin) 10/03/2016   mid upper back TX WITH BX   CAD (coronary artery disease)    Colon polyps    COPD (chronic obstructive pulmonary disease) (HCC)    DDD (degenerative disc disease), lumbar    Diverticulosis large intestine w/o perforation or abscess w/o bleeding    Emphysema, unspecified (HCC)    History of colonic polyps    Hypercholesterolemia    Hyperlipidemia    Inguinal hernia    Mucoid cyst of joint    SCC (squamous cell carcinoma) 01/23/2011   left forearm scc/ka  TX CX3 5FU CAUTERY   SCC (squamous cell carcinoma) 10/03/2016   right shin scc/ka TX CX3 5FU CAUTERY   SCC (squamous cell carcinoma) 07/29/2019   left forehead scc TX CX3 5FU CAUTERY   Scoliosis of lumbar spine, unspecified scoliosis type    Seasonal allergies    Senile purpura (HCC)     Spinal stenosis    Spinal stenosis    Squamous cell carcinoma of skin 10/23/2005   left lower shin inf. bowens  TX CX3 5FU CAUTERY   Squamous cell carcinoma of skin 08/06/2020   in situ- right lower leg ([id number])   Squamous cell carcinoma of skin 08/06/2020   in situ- right lower leg- posterior lower (CX35FU)   Varicose veins of lower extremities with other complications 05/21/2012   Past Surgical History:  Procedure Laterality Date   ABDOMINAL HYSTERECTOMY     APPENDECTOMY     BLEPHAROPLASTY     BRONCHIAL BIOPSY  12/31/2023   Procedure: BRONCHIAL BIOPSIES;  Surgeon: Leslye Peer, MD;  Location: [facility];  Service: Pulmonary;;   BRONCHIAL BRUSHINGS  12/31/2023   Procedure: BRONCHIAL BRUSHINGS;  Surgeon: Leslye Peer, MD;  Location: [facility] ENDOSCOPY;  Service: Pulmonary;;   BRONCHIAL NEEDLE ASPIRATION BIOPSY  12/31/2023   Procedure: BRONCHIAL NEEDLE ASPIRATION BIOPSIES;  Surgeon: Leslye Peer, MD;  Location: [facility] ENDOSCOPY;  Service: Pulmonary;;   BRONCHIAL WASHINGS  12/31/2023   Procedure: BRONCHIAL WASHINGS;  Surgeon: Leslye Peer, MD;  Location: [facility] ENDOSCOPY;  Service: Pulmonary;;   COLONOSCOPY  2024  FIDUCIAL MARKER PLACEMENT  12/31/2023   Procedure: FIDUCIAL MARKER PLACEMENT;  Surgeon: Leslye Peer, MD;  Location: [facility] ENDOSCOPY;  Service: Pulmonary;;   INGUINAL HERNIA REPAIR Bilateral 01/20/2022   Procedure: LAPAROSCOPIC BILATERAL INGUINAL HERNIA REPAIR WITH MESH;  Surgeon: Kinsinger, De Blanch, MD;  Location: [facility];  Service: General;  Laterality: Bilateral;   MASS EXCISION  01/11/2012   Procedure: MINOR EXCISION OF MASS;  Surgeon: Wyn Forster., MD;  Location: [facility];  Service: Orthopedics;  Laterality: Right;  excision mucoid cyst right index and dip joint debridement   TONSILLECTOMY     Patient Active Problem List   Diagnosis Date Noted   Pulmonary nodules 12/04/2023   Tobacco use 12/04/2023   Bilateral inguinal hernia 01/20/2022   Anosmia  02/02/2021   Degeneration of lumbar intervertebral disc 02/02/2021   Hardening of the aorta (main artery of the heart) (HCC) 02/02/2021   Hypercholesterolemia 02/02/2021   Other seasonal allergic rhinitis 02/02/2021   Other specified disorders of bone density and structure, other site 02/02/2021   History of colonic polyps 02/02/2021   Chronic obstructive pulmonary disease (HCC) 02/02/2021   Senile purpura (HCC) 02/02/2021   Spinal stenosis 02/02/2021   Abnormal findings on diagnostic imaging of other specified body structures 02/02/2021   Coronary artery calcification 03/19/2018   Ganglion of left wrist 03/07/2017   Varicose veins of bilateral lower extremities with other complications 05/21/2012    ONSET DATE: 2-3 weeks ago (fall)  REFERRING DIAG:  W19.XXXA (ICD-10-CM) - Unspecified fall, initial encounter   THERAPY DIAG:  Other abnormalities of gait and mobility  Muscle weakness (generalized)  Rationale for Evaluation and Treatment: Rehabilitation  SUBJECTIVE:                                                                                                                                                                                             SUBJECTIVE STATEMENT: The patient reports sit<>stand hurt her low back. She reports she has 2 garden stools that she holds onto to get up. She did home exercises a couple of times since last session.   EVAL: Fall 2-3 weeks ago (reports no recollection of the event) but came to and on the floor and unable to arise. Called EMS for assistance and checked her out. Pt reports she has been apprehensive about getting down to the floor since this episode.  Pt enjoys gardening and doing her housekeeping and this is an essential task for her. She has significant scoliosis, lumbar stenosis, and experiencing referred pain into LLE > RLE but no worse from recent incident. Some use of a cane  when first arising in the morning.  Pt accompanied by:  self  PERTINENT HISTORY: 83 year old active smoker (67 pack years) with coronary artery disease, hyperlipidemia, multiple basal cell and squamous cell carcinomas that have been resected, lumbar sciatica, anemia, diverticular disease. She has participated in lung cancer screening program and had  recent CT scan of the chest done 09/24/2023, followed by PET scan.   PAIN:  Are you having pain?  Usual aches  PRECAUTIONS: None  RED FLAGS: None   WEIGHT BEARING RESTRICTIONS: No  FALLS: Has patient fallen in last 6 months? Yes. Number of falls 1  LIVING ENVIRONMENT: Lives with: lives alone Lives in: House/apartment Stairs: Yes: External: 7 steps; bilateral but cannot reach both Has following equipment at home: Single point cane, RTS,   PLOF: Independent, attends water aerobics  PATIENT GOALS: be able to get up and down from ground, improve strength in legs  OBJECTIVE:  Note: Objective measures were completed at Evaluation unless otherwise noted. DIAGNOSTIC FINDINGS: reports recent bone density scan (reports she has osteopenia)  COGNITION: Overall cognitive status: Within functional limits for tasks assessed   SENSATION: Light touch: WFL  POSTURE: flexed trunk  and scoliosis   LOWER EXTREMITY ROM:    WFL, some limitation to left hip external rotation (seated figure 4)  LOWER EXTREMITY MMT:   MMT Right Eval Left Eval  Hip flexion 4 4  Hip extension    Hip abduction 4 4  Hip adduction 5 5  Hip internal rotation    Hip external rotation    Knee flexion 5 5  Knee extension 5 5  Ankle dorsiflexion 5 5  Ankle plantarflexion    Ankle inversion    Ankle eversion    (Blank rows = not tested)  BED MOBILITY:  independent  TRANSFERS: Assistive device utilized: None  Sit to stand: Modified independence and push to stand Stand to sit: Complete Independence Chair to chair: Modified independence Floor: Modified independence--dependent on external support, trials with mat   table for UE support and repeated with gardening stool  STAIRS: Level of Assistance: Modified independence Stair Negotiation Technique: Alternating Pattern  with Bilateral Rails Number of Stairs:   Height of Stairs:   Comments:   GAIT: Gait pattern: WFL Distance walked:  Assistive device utilized: None Level of assistance: Complete Independence Comments:   FUNCTIONAL TESTS:  5 times sit to stand: 17.91 sec w/ UE push-off Timed up and go (TUG): NT Berg Balance Scale: 48/56  M-CTSIB  Condition 1: Firm Surface, EO 30 Sec, Normal Sway  Condition 2: Firm Surface, EC 30 Sec, Normal Sway  Condition 3: Foam Surface, EO  Sec,     Sway  Condition 4: Foam Surface, EC  Sec,     Sway    [facility] Adult PT Treatment:                                                DATE: 03/25/24 Therapeutic Exercise: Standing Sit<>stand x 5 reps from 20" height surface without UE support Heel raises with UE support Heel raises without UE support  Marching with intermittent UE support Sidestepping with green band Hip flexion SLR with green band x 6 reps each Supine Bent knee fallouts x 10 reps R and L sides Sidelying L hip abduction x 10 reps-- gets some discomfort  (also performed 5 reps with hip ER,  then IR) Clamshells L x 10 reps  Reverse clamshells with yoga block between knees x 10 reps Neuromuscular re-ed: Standing balance activities including wall bumps, rolling a small physioball up/down a wall with CGA x 5 reps working on trunk elongation Therapeutic Activity: Floor<>stand: holding onto a chair and kneeling to a foam pad x 5 reps leading with R leg to return to stand and then L leg. Patient is able to do this mod indep with UE support Discussed reducing the amount of time she sits throughout the day Gait: Without device with cues for heel strike x 150 feet Gait stepping over obstacles with SBA to CGA x 15 feet     PATIENT EDUCATION: Education details: assessment details, HEP initiation Person  educated: Patient Education method: Explanation and Handouts Education comprehension: verbalized understanding  HOME EXERCISE PROGRAM: Access Code: [email or web address] URL: [email or web address] Date: 03/25/2024 Prepared by: Margretta Ditty  Exercises - Sit to Stand  - 1 x daily - 5 x weekly - 2 sets - 10 reps - Side Stepping with Resistance at Ankles and Counter Support  - 1 x daily - 2-3 x weekly - 1-3 sets - 1-2 min hold - Bent Knee Fallouts  - 1 x daily - 5 x weekly - 1 sets - 10 reps - Sidelying Hip Abduction  - 1 x daily - 5 x weekly - 2 sets - 10 reps  GOALS: Goals reviewed with patient? Yes  SHORT TERM GOALS: Target date: same as LTG   LONG TERM GOALS: Target date: 04/16/2024    Patient will be independent in HEP to improve functional outcomes Baseline:  Goal status: INITIAL  2.  Demo improved BLE strength completing 5xSTS test in 15 sec w/out UE push-off Baseline: 17 sec w/ push-off Goal status: INITIAL  3.  Demo improved mobility/LE strength as evidenced by ability to perform kneeling/lunge to standing to improve ability to perform housekeeping and gardening activities Baseline: needs UE support Goal status: INITIAL   ASSESSMENT:  CLINICAL IMPRESSION: The patient returns today noting difficulty with sit<>stand activity due to band sliding down to her ankles. Therefore, modified HEP. Added specific activities for isolated hip abduction and gentle ROM for mobility due to h/o pain and tightness in the L hip/groin region. Jamelah tolerated kneeling to stand well today. Continue working to [employer].   EVAL: Patient is a 83 y.o. lady who was seen today for physical therapy evaluation and treatment for Unspecified fall, initial encounter.  Demonstrates LE weakness and reduced mobility as compared to her PLOF with difficulty in lowering to floor/ground and arising limiting her usual activity participation of housekeeping and gardening.  Thankfully, demonstrates good  performance to fall risk measures with score 48/56 Berg Balance Test indicating low risk for falls but increased LE weakness/fall risk per time on 5xSTS test.  Patient would benefit from PT services to address deficits and limitations to restore capabilities to PLOF   OBJECTIVE IMPAIRMENTS: decreased activity tolerance, decreased mobility, and decreased strength.   PLAN:  PT FREQUENCY: 1x/week  PT DURATION: 4 weeks  PLANNED INTERVENTIONS: 97110-Therapeutic exercises, 97530- Therapeutic activity, [id number]- Neuromuscular re-education, 97535- Self Care, [id number]- Manual therapy, [phone]- Gait training, and DME instructions  PLAN FOR NEXT SESSION: Progress HEP, Discussed vacuuming and difficulty with harder household chores-- plan to assess and provide modifications for home.    11:02 AM, 03/25/24 Margretta Ditty, PT Physical Therapist- [facility] Office Number: [phone]

## 2024-04-01 ENCOUNTER — Ambulatory Visit

## 2024-04-01 DIAGNOSIS — R2689 Other abnormalities of gait and mobility: Secondary | ICD-10-CM | POA: Diagnosis not present

## 2024-04-01 DIAGNOSIS — M6281 Muscle weakness (generalized): Secondary | ICD-10-CM

## 2024-04-01 DIAGNOSIS — M5459 Other low back pain: Secondary | ICD-10-CM | POA: Diagnosis not present

## 2024-04-01 NOTE — Therapy (Signed)
 OUTPATIENT PHYSICAL THERAPY NEURO TREATMENT   Patient Name: Karen Bonilla MRN: [id number] DOB:30-Jul-1941, 83 y.o., female Today's Date: 04/01/2024   PCP: Jackelyn Poling, DO REFERRING PROVIDER: Jackelyn Poling, DO  END OF SESSION:  PT End of Session - 04/01/24 1534     Visit Number 3    Number of Visits 5    Date for PT Re-Evaluation 04/16/24    Authorization Type [employer]    Authorization Time Period Auth#: Tracking #[id number] , approved 5 PT visits from 03/19/2024-04/16/2024.**  [employer] 2025    PT Start Time 1533    PT Stop Time 1615    PT Time Calculation (min) 42 min    Activity Tolerance Patient tolerated treatment well    Behavior During Therapy WFL for tasks assessed/performed              Past Medical History:  Diagnosis Date   Abnormal chest CT    Anemia    Anosmia    Aortic atherosclerosis (HCC)    Arthritis    Back pain of lumbar region with sciatica    Basal cell carcinoma 09/01/2002   right shin tx exc   BCC (basal cell carcinoma of skin) 06/20/2013   right forearm TX WITH BX   BCC (basal cell carcinoma of skin) 06/20/2013   right upper lip TX WITH BX   BCC (basal cell carcinoma of skin) 10/03/2016   mid upper back TX WITH BX   CAD (coronary artery disease)    Colon polyps    COPD (chronic obstructive pulmonary disease) (HCC)    DDD (degenerative disc disease), lumbar    Diverticulosis large intestine w/o perforation or abscess w/o bleeding    Emphysema, unspecified (HCC)    History of colonic polyps    Hypercholesterolemia    Hyperlipidemia    Inguinal hernia    Mucoid cyst of joint    SCC (squamous cell carcinoma) 01/23/2011   left forearm scc/ka  TX CX3 5FU CAUTERY   SCC (squamous cell carcinoma) 10/03/2016   right shin scc/ka TX CX3 5FU CAUTERY   SCC (squamous cell carcinoma) 07/29/2019   left forehead scc TX CX3 5FU CAUTERY   Scoliosis of lumbar spine, unspecified scoliosis type    Seasonal allergies    Senile purpura (HCC)     Spinal stenosis    Spinal stenosis    Squamous cell carcinoma of skin 10/23/2005   left lower shin inf. bowens  TX CX3 5FU CAUTERY   Squamous cell carcinoma of skin 08/06/2020   in situ- right lower leg (CX35FU)   Squamous cell carcinoma of skin 08/06/2020   in situ- right lower leg- posterior lower (CX35FU)   Varicose veins of lower extremities with other complications 05/21/2012   Past Surgical History:  Procedure Laterality Date   ABDOMINAL HYSTERECTOMY     APPENDECTOMY     BLEPHAROPLASTY     BRONCHIAL BIOPSY  12/31/2023   Procedure: BRONCHIAL BIOPSIES;  Surgeon: Leslye Peer, MD;  Location: [facility];  Service: Pulmonary;;   BRONCHIAL BRUSHINGS  12/31/2023   Procedure: BRONCHIAL BRUSHINGS;  Surgeon: Leslye Peer, MD;  Location[facility] END[facility]COPY;  Service: Pulmonary;;   BRONCHIAL NEEDLE ASPIRATION BIOPSY  12/31/2023   Procedure: BRONCHIAL NEEDLE ASPIRATION BIOPSIES;  Surgeon: Leslye Peer, MD;  Location: [facility] ENDOSCOPY;  Service: Pulmonary;;   BRONCHIAL WASHINGS  12/31/2023   Procedure: BRONCHIAL WASHINGS;  Surgeon: Leslye Peer, MD;  Location: [facility] ENDOSCOPY;  Service: Pulmonary;;   COLONOSCOPY  2024  FIDUCIAL MARKER PLACEMENT  12/31/2023   Procedure: FIDUCIAL MARKER PLACEMENT;  Surgeon: Leslye Peer, MD;  Location: [facility] ENDOSCOPY;  Service: Pulmonary;;   INGUINAL HERNIA REPAIR Bilateral 01/20/2022   Procedure: LAPAROSCOPIC BILATERAL INGUINAL HERNIA REPAIR WITH MESH;  Surgeon: Kinsinger, De Blanch, MD;  Location: [facility];  Service: General;  Laterality: Bilateral;   MASS EXCISION  01/11/2012   Procedure: MINOR EXCISION OF MASS;  Surgeon: Wyn Forster., MD;  Location: [facility];  Service: Orthopedics;  Laterality: Right;  excision mucoid cyst right index and dip joint debridement   TONSILLECTOMY     Patient Active Problem List   Diagnosis Date Noted   Pulmonary nodules 12/04/2023   Tobacco use 12/04/2023   Bilateral inguinal hernia 01/20/2022   Anosmia  02/02/2021   Degeneration of lumbar intervertebral disc 02/02/2021   Hardening of the aorta (main artery of the heart) (HCC) 02/02/2021   Hypercholesterolemia 02/02/2021   Other seasonal allergic rhinitis 02/02/2021   Other specified disorders of bone density and structure, other site 02/02/2021   History of colonic polyps 02/02/2021   Chronic obstructive pulmonary disease (HCC) 02/02/2021   Senile purpura (HCC) 02/02/2021   Spinal stenosis 02/02/2021   Abnormal findings on diagnostic imaging of other specified body structures 02/02/2021   Coronary artery calcification 03/19/2018   Ganglion of left wrist 03/07/2017   Varicose veins of bilateral lower extremities with other complications 05/21/2012    ONSET DATE: 2-3 weeks ago (fall)  REFERRING DIAG:  W19.XXXA (ICD-10-CM) - Unspecified fall, initial encounter   THERAPY DIAG:  Other abnormalities of gait and mobility  Muscle weakness (generalized)  Rationale for Evaluation and Treatment: Rehabilitation  SUBJECTIVE:                                                                                                                                                                                             SUBJECTIVE STATEMENT: Back is hurting, the longer the day goes the worse off.  Have tried a 4WW in the past but it was too cumbersome in the house  EVAL: Fall 2-3 weeks ago (reports no recollection of the event) but came to and on the floor and unable to arise. Called EMS for assistance and checked her out. Pt reports she has been apprehensive about getting down to the floor since this episode.  Pt enjoys gardening and doing her housekeeping and this is an essential task for her. She has significant scoliosis, lumbar stenosis, and experiencing referred pain into LLE > RLE but no worse from recent incident. Some use of a cane when first arising in the morning.  Pt accompanied by: self  PERTINENT HISTORY: 83 year old active smoker (67 pack  years) with coronary artery disease, hyperlipidemia, multiple basal cell and squamous cell carcinomas that have been resected, lumbar sciatica, anemia, diverticular disease. She has participated in lung cancer screening program and had  recent CT scan of the chest done 09/24/2023, followed by PET scan.   PAIN:  Are you having pain?  Usual aches  PRECAUTIONS: None  RED FLAGS: None   WEIGHT BEARING RESTRICTIONS: No  FALLS: Has patient fallen in last 6 months? Yes. Number of falls 1  LIVING ENVIRONMENT: Lives with: lives alone Lives in: House/apartment Stairs: Yes: External: 7 steps; bilateral but cannot reach both Has following equipment at home: Single point cane, RTS,   PLOF: Independent, attends water aerobics  PATIENT GOALS: be able to get up and down from ground, improve strength in legs  OBJECTIVE:   TODAY'S TREATMENT: 04/01/24 Activity Comments  Demo of self-traction with arm chair   Limited left hip flexion Pain to anterior groin, referred pain to ischial  +Scour's test left hip   No pain with resisted hip flexion, no pain with resisted adduction, no pain with resisted hip ER. No overt pain with palpation to left greater trochanter or piriformis   Prone hip abduction 3x10 Red loop around ankles       Note: Objective measures were completed at Evaluation unless otherwise noted. DIAGNOSTIC FINDINGS: reports recent bone density scan (reports she has osteopenia)  COGNITION: Overall cognitive status: Within functional limits for tasks assessed   SENSATION: Light touch: WFL  POSTURE: flexed trunk  and scoliosis   LOWER EXTREMITY ROM:    WFL, some limitation to left hip external rotation (seated figure 4)  LOWER EXTREMITY MMT:   MMT Right Eval Left Eval  Hip flexion 4 4  Hip extension    Hip abduction 4 4  Hip adduction 5 5  Hip internal rotation    Hip external rotation    Knee flexion 5 5  Knee extension 5 5  Ankle dorsiflexion 5 5  Ankle plantarflexion     Ankle inversion    Ankle eversion    (Blank rows = not tested)  BED MOBILITY:  independent  TRANSFERS: Assistive device utilized: None  Sit to stand: Modified independence and push to stand Stand to sit: Complete Independence Chair to chair: Modified independence Floor: Modified independence--dependent on external support, trials with mat  table for UE support and repeated with gardening stool  STAIRS: Level of Assistance: Modified independence Stair Negotiation Technique: Alternating Pattern  with Bilateral Rails Number of Stairs:   Height of Stairs:   Comments:   GAIT: Gait pattern: WFL Distance walked:  Assistive device utilized: None Level of assistance: Complete Independence Comments:   FUNCTIONAL TESTS:  5 times sit to stand: 17.91 sec w/ UE push-off Timed up and go (TUG): NT Berg Balance Scale: 48/56  M-CTSIB  Condition 1: Firm Surface, EO 30 Sec, Normal Sway  Condition 2: Firm Surface, EC 30 Sec, Normal Sway  Condition 3: Foam Surface, EO  Sec,     Sway  Condition 4: Foam Surface, EC  Sec,     Sway    [facility] Adult PT Treatment:                                                DATE: 03/25/24 Therapeutic Exercise:  Standing Sit<>stand x 5 reps from 20" height surface without UE support Heel raises with UE support Heel raises without UE support  Marching with intermittent UE support Sidestepping with green band Hip flexion SLR with green band x 6 reps each Supine Bent knee fallouts x 10 reps R and L sides Sidelying L hip abduction x 10 reps-- gets some discomfort  (also performed 5 reps with hip ER, then IR) Clamshells L x 10 reps  Reverse clamshells with yoga block between knees x 10 reps Neuromuscular re-ed: Standing balance activities including wall bumps, rolling a small physioball up/down a wall with CGA x 5 reps working on trunk elongation Therapeutic Activity: Floor<>stand: holding onto a chair and kneeling to a foam pad x 5 reps leading with R leg to  return to stand and then L leg. Patient is able to do this mod indep with UE support Discussed reducing the amount of time she sits throughout the day Gait: Without device with cues for heel strike x 150 feet Gait stepping over obstacles with SBA to CGA x 15 feet     PATIENT EDUCATION: Education details: assessment details, HEP initiation Person educated: Patient Education method: Explanation and Handouts Education comprehension: verbalized understanding  HOME EXERCISE PROGRAM: Access Code: [email or web address] URL: [email or web address] Date: 03/25/2024 Prepared by: Margretta Ditty  Exercises - Sit to Stand  - 1 x daily - 5 x weekly - 2 sets - 10 reps - Side Stepping with Resistance at Ankles and Counter Support  - 1 x daily - 2-3 x weekly - 1-3 sets - 1-2 min hold - Bent Knee Fallouts  - 1 x daily - 5 x weekly - 1 sets - 10 reps - Sidelying Hip Abduction  - 1 x daily - 5 x weekly - 2 sets - 10 reps - Prone Hip Abduction on Slider  - 1 x daily - 7 x weekly - 3 sets - 10 reps  GOALS: Goals reviewed with patient? Yes  SHORT TERM GOALS: Target date: same as LTG   LONG TERM GOALS: Target date: 04/16/2024    Patient will be independent in HEP to improve functional outcomes Baseline:  Goal status: INITIAL  2.  Demo improved BLE strength completing 5xSTS test in 15 sec w/out UE push-off Baseline: 17 sec w/ push-off Goal status: INITIAL  3.  Demo improved mobility/LE strength as evidenced by ability to perform kneeling/lunge to standing to improve ability to perform housekeeping and gardening activities Baseline: needs UE support Goal status: INITIAL   ASSESSMENT:  CLINICAL IMPRESSION: Notes instances of radiating LLE pain from buttocks, posterior thigh, occasional calf.  No discomfort to palpation of left piriformis, greater trochanter, and prone knee bend unrevealing. Movement to left hip provocative to hip flexion and Scour's sign reproducing groin pain and some  buttock/ischial pain with minimal issue to forced hip internal rotation or abduction.  Instructed in prone hip abduction to improve strength/stability to hip abduction for improved stance control.  Continued sessions to progress POC details and improve mobility for return to PLOF activities  OBJECTIVE IMPAIRMENTS: decreased activity tolerance, decreased mobility, and decreased strength.   PLAN:  PT FREQUENCY: 1x/week  PT DURATION: 4 weeks  PLANNED INTERVENTIONS: 97110-Therapeutic exercises, 97530- Therapeutic activity, [id number]- Neuromuscular re-education, 97535- Self Care, [id number]- Manual therapy, [phone]- Gait training, and DME instructions  PLAN FOR NEXT SESSION: Progress HEP, Discussed vacuuming and difficulty with harder household chores-- plan to assess and provide modifications for home.    3:34 PM, 04/01/24 Dion Body,  PT Physical TherapistDolores Lory Office Number: [phone]

## 2024-04-08 ENCOUNTER — Ambulatory Visit

## 2024-04-09 ENCOUNTER — Ambulatory Visit
Admission: RE | Admit: 2024-04-09 | Discharge: 2024-04-09 | Disposition: A | Discharge status: Home / Self Care | Payer: Medicare PPO | Source: Ambulatory Visit | Attending: Acute Care | Admitting: Acute Care

## 2024-04-09 DIAGNOSIS — R911 Solitary pulmonary nodule: Secondary | ICD-10-CM

## 2024-04-15 ENCOUNTER — Ambulatory Visit

## 2024-04-15 DIAGNOSIS — R2689 Other abnormalities of gait and mobility: Secondary | ICD-10-CM | POA: Diagnosis not present

## 2024-04-15 DIAGNOSIS — M5459 Other low back pain: Secondary | ICD-10-CM | POA: Diagnosis not present

## 2024-04-15 DIAGNOSIS — M6281 Muscle weakness (generalized): Secondary | ICD-10-CM

## 2024-04-15 NOTE — Therapy (Signed)
 OUTPATIENT PHYSICAL THERAPY NEURO TREATMENT and D/C Summary   Patient Name: Karen Bonilla MRN: [id number] DOB:[birth year], 83 y.o., female Today's Date: 04/15/2024   PCP: Mordechai April, DO REFERRING PROVIDER: Mordechai April, DO  PHYSICAL THERAPY DISCHARGE SUMMARY  Visits from Start of Care: 4  Current functional level related to goals / functional outcomes: Goals met   Remaining deficits: Ongoing left hip/buttock referred pain   Education / Equipment: HEP   Patient agrees to discharge. Patient goals were met. Patient is being discharged due to being pleased with the current functional level.   END OF SESSION:  PT End of Session - 04/15/24 1446     Visit Number 4    Number of Visits 5    Date for PT Re-Evaluation 04/16/24    Authorization Type [employer]    Authorization Time Period Auth#: Tracking #[id number] , approved 5 PT visits from 03/19/2024-04/16/2024.**  [employer] 2025    PT Start Time 1445    PT Stop Time 1530    PT Time Calculation (min) 45 min    Activity Tolerance Patient tolerated treatment well    Behavior During Therapy WFL for tasks assessed/performed              Past Medical History:  Diagnosis Date   Abnormal chest CT    Anemia    Anosmia    Aortic atherosclerosis (HCC)    Arthritis    Back pain of lumbar region with sciatica    Basal cell carcinoma 09/01/2002   right shin tx exc   BCC (basal cell carcinoma of skin) 06/20/2013   right forearm TX WITH BX   BCC (basal cell carcinoma of skin) 06/20/2013   right upper lip TX WITH BX   BCC (basal cell carcinoma of skin) 10/03/2016   mid upper back TX WITH BX   CAD (coronary artery disease)    Colon polyps    COPD (chronic obstructive pulmonary disease) (HCC)    DDD (degenerative disc disease), lumbar    Diverticulosis large intestine w/o perforation or abscess w/o bleeding    Emphysema, unspecified (HCC)    History of colonic polyps    Hypercholesterolemia    Hyperlipidemia     Inguinal hernia    Mucoid cyst of joint    SCC (squamous cell carcinoma) 01/23/2011   left forearm scc/ka  TX CX3 5FU CAUTERY   SCC (squamous cell carcinoma) 10/03/2016   right shin scc/ka TX CX3 5FU CAUTERY   SCC (squamous cell carcinoma) 07/29/2019   left forehead scc TX CX3 5FU CAUTERY   Scoliosis of lumbar spine, unspecified scoliosis type    Seasonal allergies    Senile purpura (HCC)    Spinal stenosis    Spinal stenosis    Squamous cell carcinoma of skin 10/23/2005   left lower shin inf. bowens  TX CX3 5FU CAUTERY   Squamous cell carcinoma of skin 08/06/2020   in situ- right lower leg ([id number])   Squamous cell carcinoma of skin 08/06/2020   in situ- right lower leg- posterior lower ([id number])   Varicose veins of lower extremities with other complications 05/21/2012   Past Surgical History:  Procedure Laterality Date   ABDOMINAL HYSTERECTOMY     APPENDECTOMY     BLEPHAROPLASTY     BRONCHIAL BIOPSY  12/31/2023   Procedure: BRONCHIAL BIOPSIES;  Surgeon: Denson Flake, MD;  Location: [facility]COPY;  Service: Pulmonary;;   BRONCHIAL BRUSHINGS  12/31/2023   Procedure: BRONCHIAL BRUSHINGS;  Surgeon: Racheal Buddle  S, MD;  Location: [facility] ENDOSCOPY;  Service: Pulmonary;;   BRONCHIAL NEEDLE ASPIRATION BIOPSY  12/31/2023   Procedure: BRONCHIAL NEEDLE ASPIRATION BIOPSIES;  Surgeon: Denson Flake, MD;  Location: [facility] ENDOSCOPY;  Service: Pulmonary;;   BRONCHIAL WASHINGS  12/31/2023   Procedure: BRONCHIAL WASHINGS;  Surgeon: Denson Flake, MD;  Location: [facility] ENDOSCOPY;  Service: Pulmonary;;   COLONOSCOPY  2024   FIDUCIAL MARKER PLACEMENT  12/31/2023   Procedure: FIDUCIAL MARKER PLACEMENT;  Surgeon: Denson Flake, MD;  Location: [facility] ENDOSCOPY;  Service: Pulmonary;;   INGUINAL HERNIA REPAIR Bilateral 01/20/2022   Procedure: LAPAROSCOPIC BILATERAL INGUINAL HERNIA REPAIR WITH MESH;  Surgeon: Dorrie Gaudier Alphonso Aschoff, MD;  Location: [facility] ORS;  Service: General;  Laterality: Bilateral;   MASS EXCISION   01/11/2012   Procedure: MINOR EXCISION OF MASS;  Surgeon: Amelie Baize., MD;  Location: [facility];  Service: Orthopedics;  Laterality: Right;  excision mucoid cyst right index and dip joint debridement   TONSILLECTOMY     Patient Active Problem List   Diagnosis Date Noted   Pulmonary nodules 12/04/2023   Tobacco use 12/04/2023   Bilateral inguinal hernia 01/20/2022   Anosmia 02/02/2021   Degeneration of lumbar intervertebral disc 02/02/2021   Hardening of the aorta (main artery of the heart) (HCC) 02/02/2021   Hypercholesterolemia 02/02/2021   Other seasonal allergic rhinitis 02/02/2021   Other specified disorders of bone density and structure, other site 02/02/2021   History of colonic polyps 02/02/2021   Chronic obstructive pulmonary disease (HCC) 02/02/2021   Senile purpura (HCC) 02/02/2021   Spinal stenosis 02/02/2021   Abnormal findings on diagnostic imaging of other specified body structures 02/02/2021   Coronary artery calcification 03/19/2018   Ganglion of left wrist 03/07/2017   Varicose veins of bilateral lower extremities with other complications 05/21/2012    ONSET DATE: 2-3 weeks ago (fall)  REFERRING DIAG:  W19.XXXA (ICD-10-CM) - Unspecified fall, initial encounter   THERAPY DIAG:  Other abnormalities of gait and mobility  Muscle weakness (generalized)  Other low back pain  Rationale for Evaluation and Treatment: Rehabilitation  SUBJECTIVE:                                                                                                                                                                                             SUBJECTIVE STATEMENT: The last exercise of laying on the stomach is too difficult and uncomfortable  EVAL: Fall 2-3 weeks ago (reports no recollection of the event) but came to and on the floor and unable to arise. Called EMS for assistance and checked her out. Pt reports she has  been apprehensive about getting down  to the floor since this episode.  Pt enjoys gardening and doing her housekeeping and this is an essential task for her. She has significant scoliosis, lumbar stenosis, and experiencing referred pain into LLE > RLE but no worse from recent incident. Some use of a cane when first arising in the morning.  Pt accompanied by: self  PERTINENT HISTORY: 83 year old active smoker (67 pack years) with coronary artery disease, hyperlipidemia, multiple basal cell and squamous cell carcinomas that have been resected, lumbar sciatica, anemia, diverticular disease. She has participated in lung cancer screening program and had  recent CT scan of the chest done 09/24/2023, followed by PET scan.   PAIN:  Are you having pain?  Usual aches  PRECAUTIONS: None  RED FLAGS: None   WEIGHT BEARING RESTRICTIONS: No  FALLS: Has patient fallen in last 6 months? Yes. Number of falls 1  LIVING ENVIRONMENT: Lives with: lives alone Lives in: House/apartment Stairs: Yes: External: 7 steps; bilateral but cannot reach both Has following equipment at home: Single point cane, RTS,   PLOF: Independent, attends water aerobics  PATIENT GOALS: be able to get up and down from ground, improve strength in legs  OBJECTIVE:   TODAY'S TREATMENT: 04/15/24 Activity Comments  Gait training Demo of sequence with cane for LLE offloading  5xSTS test 15 sec minor use of BUE  Floor to stand- Modified independence and then able to demo independence with "reverse inch worm"  Demo/discussion/practice of walking with right lateral lumbar flexion and/or hip external rotation To assess if referred pain coming from lumbar or hip              TODAY'S TREATMENT: 04/01/24 Activity Comments  Demo of self-traction with arm chair   Limited left hip flexion Pain to anterior groin, referred pain to ischial  +Scour's test left hip   No pain with resisted hip flexion, no pain with resisted adduction, no pain with resisted hip ER. No overt pain  with palpation to left greater trochanter or piriformis   Prone hip abduction 3x10 Red loop around ankles       Note: Objective measures were completed at Evaluation unless otherwise noted. DIAGNOSTIC FINDINGS: reports recent bone density scan (reports she has osteopenia)  COGNITION: Overall cognitive status: Within functional limits for tasks assessed   SENSATION: Light touch: WFL  POSTURE: flexed trunk  and scoliosis   LOWER EXTREMITY ROM:    WFL, some limitation to left hip external rotation (seated figure 4)  LOWER EXTREMITY MMT:   MMT Right Eval Left Eval  Hip flexion 4 4  Hip extension    Hip abduction 4 4  Hip adduction 5 5  Hip internal rotation    Hip external rotation    Knee flexion 5 5  Knee extension 5 5  Ankle dorsiflexion 5 5  Ankle plantarflexion    Ankle inversion    Ankle eversion    (Blank rows = not tested)  BED MOBILITY:  independent  TRANSFERS: Assistive device utilized: None  Sit to stand: Modified independence and push to stand Stand to sit: Complete Independence Chair to chair: Modified independence Floor: Modified independence--dependent on external support, trials with mat  table for UE support and repeated with gardening stool  STAIRS: Level of Assistance: Modified independence Stair Negotiation Technique: Alternating Pattern  with Bilateral Rails Number of Stairs:   Height of Stairs:   Comments:   GAIT: Gait pattern: WFL Distance walked:  Assistive device utilized: None Level  of assistance: Complete Independence Comments:   FUNCTIONAL TESTS:  5 times sit to stand: 17.91 sec w/ UE push-off Timed up and go (TUG): NT Berg Balance Scale: 48/56  M-CTSIB  Condition 1: Firm Surface, EO 30 Sec, Normal Sway  Condition 2: Firm Surface, EC 30 Sec, Normal Sway  Condition 3: Foam Surface, EO  Sec,     Sway  Condition 4: Foam Surface, EC  Sec,     Sway         PATIENT EDUCATION: Education details: assessment details, HEP  initiation Person educated: Patient Education method: Chief Technology Officer Education comprehension: verbalized understanding  HOME EXERCISE PROGRAM: Access Code: [email or web address] URL: [email or web address] Date: 03/25/2024 Prepared by: Trygve Gage  Exercises - Sit to Stand  - 1 x daily - 5 x weekly - 2 sets - 10 reps - Side Stepping with Resistance at Ankles and Counter Support  - 1 x daily - 2-3 x weekly - 1-3 sets - 1-2 min hold - Bent Knee Fallouts  - 1 x daily - 5 x weekly - 1 sets - 10 reps - Sidelying Hip Abduction  - 1 x daily - 5 x weekly - 2 sets - 10 reps - Prone Hip Abduction on Slider  - 1 x daily - 7 x weekly - 3 sets - 10 reps  GOALS: Goals reviewed with patient? Yes  SHORT TERM GOALS: Target date: same as LTG   LONG TERM GOALS: Target date: 04/16/2024    Patient will be independent in HEP to improve functional outcomes Baseline:  Goal status: MET  2.  Demo improved BLE strength completing 5xSTS test in 15 sec w/out UE push-off Baseline: 17 sec w/ push-off; 15 sec light UE support for arising Goal status: Partially met  3.  Demo improved mobility/LE strength as evidenced by ability to perform kneeling/lunge to standing to improve ability to perform housekeeping and gardening activities Baseline: needs UE support; modified to independent Goal status: MET   ASSESSMENT:  CLINICAL IMPRESSION: 5xSTS test requiring light UE support for arising and met time requirement for low risk for falls.  Demo ability to perform floor to stand unassisted.  Pt reports ongoing left hip/buttock pain which is increased with prolonged standing/walking.  Dmeo techniques for self-assessment to help determine lumbar vs hip origin with good return demonstration. Instruction in sequence with cane to offload LLE with good reurn demonstration.  Will D/C to HEP at this time and recommend ongoing aquatic exercise classes.   OBJECTIVE IMPAIRMENTS: decreased activity tolerance,  decreased mobility, and decreased strength.   PLAN:  PT FREQUENCY: 1x/week  PT DURATION: 4 weeks  PLANNED INTERVENTIONS: 97110-Therapeutic exercises, 97530- Therapeutic activity, [id number]- Neuromuscular re-education, [phone]- Self Care, [id number]- Manual therapy, [phone]- Gait training, and DME instructions  PLAN FOR NEXT SESSION: D/C to HEP   2:46 PM, 04/15/24 Angie Bari, PT Physical Therapist- [place] Office Number: [phone]

## 2024-04-22 ENCOUNTER — Telehealth: Payer: Self-pay | Admitting: Acute Care

## 2024-04-22 NOTE — Telephone Encounter (Signed)
 Called the pt and got her in for Friday May 2nd at 10am with Isa Manuel to go over ct and canceled her July appointment.

## 2024-04-22 NOTE — Telephone Encounter (Signed)
 Good Morning, Patient called office to see if there was an appointment earlier that the current schedule of 7/8. She would like someone to contact her regarding the results of her CT scan. We did recheck the schedule and placed the patient on the call list should an earlier appointment becomes available. Thank-you

## 2024-04-25 ENCOUNTER — Encounter: Payer: Self-pay | Admitting: Acute Care

## 2024-04-25 ENCOUNTER — Ambulatory Visit: Admitting: Acute Care

## 2024-04-25 VITALS — BP 146/66 | HR 57 | Ht 63.0 in | Wt 140.8 lb

## 2024-04-25 DIAGNOSIS — F17200 Nicotine dependence, unspecified, uncomplicated: Secondary | ICD-10-CM | POA: Diagnosis not present

## 2024-04-25 DIAGNOSIS — Z9889 Other specified postprocedural states: Secondary | ICD-10-CM | POA: Diagnosis not present

## 2024-04-25 DIAGNOSIS — R911 Solitary pulmonary nodule: Secondary | ICD-10-CM

## 2024-04-25 NOTE — Progress Notes (Addendum)
 History of Present Illness Karen Bonilla is a 83 y.o.  female current every day smoker ( 67 pack years) followed through the lung cancer screening program, followed for an abnormal lung cancer screening scan done 09/24/2023. He is followed by Dr. Baldwin Levee   Synopsis  83 year old active smoker (67 pack years) with coronary artery disease, hyperlipidemia, multiple basal cell and squamous cell carcinomas that have been resected, lumbar sciatica, anemia, diverticular disease. She has participated in lung cancer screening program and had  recent CT scan of the chest done 09/24/2023, followed by PET scan.    The CT scan revealed moderate central lobular emphysematous change and an irregular solid right lower lobe nodule in the paraspinous region, measuring 2.4 by 1.6 cm. This nodule has increased in size compared to the January 2023 scan, where it measured 2.0 by 1.1 cm. A new 3 mm solid left upper lobe nodule was also identified. The PET scan showed mild hypermetabolism in the medial right lower lobe nodule with a max SUV of 3.6. There was no hypermetabolic thoracic lymphadenopathy or evidence of any disease.  She underwent bronchoscopy with biopsies 1.6.2025 by Dr. Baldwin Levee .  Biopsies of the right lower lobe were negative for malignant cells.  Plan after the January 08, 2024 follow-up office visit were for a 33-month follow-up CT scan to closely observe the behavior of the pulmonary nodule biopsy. Patient is here today to review results of the 10-month CT chest.  Pt. Has consented to use of [employer] soft wear to help capture the content of this OV.  04/25/2024 Patient presents for follow-up to review the results of her 78-month follow-up CT chest after biopsy in January which was negative for malignancy in the right lower lobe.  Per radiology report the nodule is grossly stable but continues to appear aggressive in its nature.  Recommendation is for continued attention at follow-up to ensure stability or  regression.  She has no new symptoms such as unexplained weight loss or hemoptysis. Her weight has been stable around 138 to 141 pounds, although she notes a 20-pound weight loss over the past 20 years, which she attributes to being more physically active since retirement. No significant breathing issues are reported.  She continues to smoke. I have counseled her to quit. We will do another 73-month follow-up CT of the chest which we will do in July 2025.  If the nodule continues to look stable we can consider spreading her surveillance CTs out.  Test Results: CT Chest 04/09/2024 read 04/23/2024  Grossly stable size and appearance of aggressive appearing nodule in the medial right lower lobe, as above. Continued attention on follow-up studies is recommended to ensure stability or regression. 2. Mild diffuse bronchial wall thickening with mild centrilobular and paraseptal emphysema; imaging findings suggestive of underlying COPD. 3. Aortic atherosclerosis, in addition to left main and three-vessel coronary artery disease. There are calcifications of the aortic valve. Echocardiographic correlation for evaluation of potential valvular dysfunction may be warranted if clinically indicated.    Latest Ref Rng & Units 12/31/2023    7:05 AM 01/09/2022    2:06 PM  CBC  WBC 4.0 - 10.5 K/uL 4.7  6.3   Hemoglobin 12.0 - 15.0 g/dL 57.8  46.9   Hematocrit 36.0 - 46.0 % 44.1  43.9   Platelets 150 - 400 K/uL 248  288        Latest Ref Rng & Units 12/31/2023    7:05 AM  BMP  Glucose 70 -  99 mg/dL 85   BUN 8 - 23 mg/dL 15   Creatinine 1.61 - 1.00 mg/dL 0.96   Sodium 045 - 409 mmol/L 137   Potassium 3.5 - 5.1 mmol/L 4.1   Chloride 98 - 111 mmol/L 104   CO2 22 - 32 mmol/L 23   Calcium 8.9 - 10.3 mg/dL 9.3     BNP No results found for: "BNP"  ProBNP No results found for: "PROBNP"  PFT No results found for: "FEV1PRE", "FEV1POST", "FVCPRE", "FVCPOST", "TLC", "DLCOUNC", "PREFEV1FVCRT",  "PSTFEV1FVCRT"  CT CHEST WO CONTRAST Result Date: 04/23/2024 CLINICAL DATA:  83 year old female with history of pulmonary nodule. Followup study. EXAM: CT CHEST WITHOUT CONTRAST TECHNIQUE: Multidetector CT imaging of the chest was performed following the standard protocol without IV contrast. RADIATION DOSE REDUCTION: This exam was performed according to the departmental dose-optimization program which includes automated exposure control, adjustment of the mA and/or kV according to patient size and/or use of iterative reconstruction technique. COMPARISON:  Multiple priors, most recently chest CT 12/28/2023 and PET-CT 11/05/2023. FINDINGS: Cardiovascular: Heart size is normal. There is no significant pericardial fluid, thickening or pericardial calcification. There is aortic atherosclerosis, as well as atherosclerosis of the great vessels of the mediastinum and the coronary arteries, including calcified atherosclerotic plaque in the left main, left anterior descending, left circumflex and right coronary arteries. Calcifications of the aortic valve. Mediastinum/Nodes: No pathologically enlarged mediastinal or hilar lymph nodes. Please note that accurate exclusion of hilar adenopathy is limited on noncontrast CT scans. Esophagus is unremarkable in appearance. No axillary lymphadenopathy. Lungs/Pleura: Fiducial marker in the medial right lower lobe adjacent to a pleural-based nodule which currently measures 2.9 x 1.4 x 2.6 cm (axial image 122 of series 8 and coronal image 94 of series 4), stable within measurement error compared to the prior examination (3.2 x 1.1 x 2.5 cm when remeasured in a similar fashion on the prior study). No other definite new suspicious appearing pulmonary nodules or masses are noted. No acute consolidative airspace disease. No pleural effusions. Mild diffuse bronchial wall thickening with mild centrilobular and paraseptal emphysema. Upper Abdomen: Aortic atherosclerosis. Musculoskeletal:  There are no aggressive appearing lytic or blastic lesions noted in the visualized portions of the skeleton. IMPRESSION: 1. Grossly stable size and appearance of aggressive appearing nodule in the medial right lower lobe, as above. Continued attention on follow-up studies is recommended to ensure stability or regression. 2. Mild diffuse bronchial wall thickening with mild centrilobular and paraseptal emphysema; imaging findings suggestive of underlying COPD. 3. Aortic atherosclerosis, in addition to left main and three-vessel coronary artery disease. There are calcifications of the aortic valve. Echocardiographic correlation for evaluation of potential valvular dysfunction may be warranted if clinically indicated. Aortic Atherosclerosis (ICD10-I70.0) and Emphysema (ICD10-J43.9). Electronically Signed   By: Alexandria Angel M.D.   On: 04/23/2024 13:35     Past medical hx Past Medical History:  Diagnosis Date   Abnormal chest CT    Anemia    Anosmia    Aortic atherosclerosis (HCC)    Arthritis    Back pain of lumbar region with sciatica    Basal cell carcinoma 09/01/2002   right shin tx exc   BCC (basal cell carcinoma of skin) 06/20/2013   right forearm TX WITH BX   BCC (basal cell carcinoma of skin) 06/20/2013   right upper lip TX WITH BX   BCC (basal cell carcinoma of skin) 10/03/2016   mid upper back TX WITH BX   CAD (coronary artery disease)  Colon polyps    COPD (chronic obstructive pulmonary disease) (HCC)    DDD (degenerative disc disease), lumbar    Diverticulosis large intestine w/o perforation or abscess w/o bleeding    Emphysema, unspecified (HCC)    History of colonic polyps    Hypercholesterolemia    Hyperlipidemia    Inguinal hernia    Mucoid cyst of joint    SCC (squamous cell carcinoma) 01/23/2011   left forearm scc/ka  TX CX3 5FU CAUTERY   SCC (squamous cell carcinoma) 10/03/2016   right shin scc/ka TX CX3 5FU CAUTERY   SCC (squamous cell carcinoma) 07/29/2019    left forehead scc TX CX3 5FU CAUTERY   Scoliosis of lumbar spine, unspecified scoliosis type    Seasonal allergies    Senile purpura (HCC)    Spinal stenosis    Spinal stenosis    Squamous cell carcinoma of skin 10/23/2005   left lower shin inf. bowens  TX CX3 5FU CAUTERY   Squamous cell carcinoma of skin 08/06/2020   in situ- right lower leg ([id number])   Squamous cell carcinoma of skin 08/06/2020   in situ- right lower leg- posterior lower (CX35FU)   Varicose veins of lower extremities with other complications 05/21/2012     Social History   Tobacco Use   Smoking status: Every Day    Current packs/day: 1.00    Average packs/day: 1 pack/day for 67.3 years (67.3 ttl pk-yrs)    Types: Cigarettes    Start date: 1958   Smokeless tobacco: Never   Tobacco comments:    Smoking less than a pack per day.  She does not inhale the smoke.  12/04/2023.  Has been smoking for 65 years.  Not trying to quit.  Vaping Use   Vaping status: Never Used  Substance Use Topics   Alcohol  use: Yes    Alcohol /week: 7.0 standard drinks of alcohol     Types: 7 Glasses of wine per week    Comment: WINE NIGHTLY WITH DINNER   Drug use: No    Ms.Sanzo reports that she has been smoking cigarettes. She started smoking about 67 years ago. She has a 67.3 pack-year smoking history. She has never used smokeless tobacco. She reports current alcohol  use of about 7.0 standard drinks of alcohol  per week. She reports that she does not use drugs.  Tobacco Cessation: Ready to quit: Not Answered Counseling given: Not Answered Tobacco comments: Smoking less than a pack per day.  She does not inhale the smoke.  12/04/2023.  Has been smoking for 65 years.  Not trying to quit. Current everyday smoker with a 65-pack-year smoking history She is currently not trying to quit She has been counseled extensively on smoking cessation  Past surgical hx, Family hx, Social hx all reviewed.  Current Outpatient Medications on File  Prior to Visit  Medication Sig   acetaminophen  (TYLENOL  8 HOUR) 650 MG CR tablet 1-2 tablets Orally twice a day (scheduled for pain) OTC   albuterol  (PROVENTIL  HFA;VENTOLIN  HFA) 108 (90 BASE) MCG/ACT inhaler Inhale into the lungs every 6 (six) hours as needed for wheezing or shortness of breath.   alendronate (FOSAMAX) 70 MG tablet Take 70 mg by mouth once a week. Takes on Sunday   calcium carbonate (TUMS - DOSED IN MG ELEMENTAL CALCIUM) 500 MG chewable tablet Chew 1 tablet by mouth daily as needed.   cholecalciferol  (VITAMIN D ) 25 MCG (1000 UNIT) tablet Take 1,000 Units by mouth at bedtime.   Polyethyl Glycol-Propyl Glycol (SYSTANE) 0.4-0.3 %  SOLN Place 1-2 drops into both eyes 2 (two) times daily as needed (dry/irritated eyes.).   pravastatin  (PRAVACHOL ) 20 MG tablet Take 20 mg by mouth at bedtime.   traMADol  (ULTRAM ) 50 MG tablet Take 1 tablet (50 mg total) by mouth every 6 (six) hours as needed for moderate pain or severe pain.   No current facility-administered medications on file prior to visit.     No Known Allergies  Review Of Systems:  Constitutional:   No  weight loss, night sweats,  Fevers, chills, fatigue, or  lassitude.  HEENT:   No headaches,  Difficulty swallowing,  Tooth/dental problems, or  Sore throat,                No sneezing, itching, ear ache, nasal congestion, post nasal drip,   CV:  No chest pain,  Orthopnea, PND, swelling in lower extremities, anasarca, dizziness, palpitations, syncope.   GI  No heartburn, indigestion, abdominal pain, nausea, vomiting, diarrhea, change in bowel habits, loss of appetite, bloody stools.   Resp: + shortness of breath with exertion or at rest.  No excess mucus, no productive cough,  No non-productive cough,  No coughing up of blood.  No change in color of mucus.  Occasional wheezing.  No chest wall deformity  Skin: no rash or lesions.  GU: no dysuria, change in color of urine, no urgency or frequency.  No flank pain, no hematuria    MS:  No joint pain or swelling.  No decreased range of motion.  No back pain.  Psych:  No change in mood or affect. No depression or anxiety.  No memory loss.   Vital Signs BP (!) 168/61 (BP Location: Left Arm, Patient Position: Sitting, Cuff Size: Normal)   Pulse (!) 57   Ht 5\' 3"  (1.6 m)   Wt 140 lb 12.8 oz (63.9 kg)   SpO2 97%   BMI 24.94 kg/m    Physical Exam:  General- No distress,  A&Ox3, pleasant and appropriate ENT: No sinus tenderness, TM clear, pale nasal mucosa, no oral exudate,no post nasal drip, no LAN Cardiac: S1, S2, regular rate and rhythm, no murmur Chest: No wheeze/ rales/ dullness; no accessory muscle use, no nasal flaring, no sternal retractions, slightly diminished per bases Abd.: Soft Non-tender, nondistended, bowel sounds positive,Body mass index is 24.94 kg/m.  Ext: No clubbing cyanosis, edema, no obvious deformities Neuro:  normal strength, walks with a cane, moving all extremities x 4 , alert and oriented x 3 Skin: No rashes, warm and dry, no obvious skin lesions Psych: normal mood and behavior   Assessment/Plan Stable but aggressive appearing pulmonary nodule - January 2025 for malignant cells upon navigational bronchoscopy with biopsies Current everyday smoker with a 65-pack-year smoking history Plan - Schedule follow-up CT scan in mid-July to monitor stability. - Refer to radiation oncology if growth observed on next scan, without another biopsy. - Instructed to report new symptoms such as unexplained weight loss or hemoptysis immediately. - Counseled extensively on smoking cessation - Please work on quitting smoking. You can receive free nicotine replacement therapy ( patches, gum or mints) by calling 1-800-QUIT NOW. Please call so we can get you on the path to becoming  a non-smoker. I know it is hard, but you can do this!  Other options for assistance in smoking cessation ( As covered by your insurance benefits)  Hypnosis for smoking  cessation  [employer] [phone]  Acupuncture for smoking cessation  [employer] [phone]  Call sooner for any unintended weight loss , or blood in your sputum. Please contact office for sooner follow up if symptoms do not improve or worsen or seek emergency care    I spent 20 minutes dedicated to the care of this patient on the date of this encounter to include pre-visit review of records, face-to-face time with the patient discussing conditions above, post visit ordering of testing, clinical documentation with the electronic health record, making appropriate referrals as documented, and communicating necessary information to the patient's healthcare team.    Raejean Bullock, NP 04/25/2024  10:17 AM

## 2024-04-25 NOTE — Patient Instructions (Addendum)
 It is good to see you today. The most recent CT Chest shows a aggressive appearing nodule in the medial right lower lobe. We will do another 3 month follow up scan which will be due in July 2025.  You will get a call to get this scheduled closer to the time. Follow up with me after the scan to review the results. Please work on quitting smoking. You can receive free nicotine replacement therapy ( patches, gum or mints) by calling 1-800-QUIT NOW. Please call so we can get you on the path to becoming  a non-smoker. I know it is hard, but you can do this!  Other options for assistance in smoking cessation ( As covered by your insurance benefits)  Hypnosis for smoking cessation  [employer] [phone]  Acupuncture for smoking cessation  [facility] [phone]    Call sooner for any unintended weight loss , or blood in your sputum. Please contact office for sooner follow up if symptoms do not improve or worsen or seek emergency care

## 2024-05-15 DIAGNOSIS — L821 Other seborrheic keratosis: Secondary | ICD-10-CM | POA: Diagnosis not present

## 2024-05-15 DIAGNOSIS — L720 Epidermal cyst: Secondary | ICD-10-CM | POA: Diagnosis not present

## 2024-05-15 DIAGNOSIS — L570 Actinic keratosis: Secondary | ICD-10-CM | POA: Diagnosis not present

## 2024-05-15 DIAGNOSIS — D2261 Melanocytic nevi of right upper limb, including shoulder: Secondary | ICD-10-CM | POA: Diagnosis not present

## 2024-05-15 DIAGNOSIS — L2989 Other pruritus: Secondary | ICD-10-CM | POA: Diagnosis not present

## 2024-05-15 DIAGNOSIS — Z85828 Personal history of other malignant neoplasm of skin: Secondary | ICD-10-CM | POA: Diagnosis not present

## 2024-06-02 DIAGNOSIS — M19012 Primary osteoarthritis, left shoulder: Secondary | ICD-10-CM | POA: Diagnosis not present

## 2024-06-05 DIAGNOSIS — L03115 Cellulitis of right lower limb: Secondary | ICD-10-CM | POA: Diagnosis not present

## 2024-06-05 DIAGNOSIS — W5503XA Scratched by cat, initial encounter: Secondary | ICD-10-CM | POA: Diagnosis not present

## 2024-06-09 DIAGNOSIS — M19012 Primary osteoarthritis, left shoulder: Secondary | ICD-10-CM | POA: Diagnosis not present

## 2024-06-10 DIAGNOSIS — M8588 Other specified disorders of bone density and structure, other site: Secondary | ICD-10-CM | POA: Diagnosis not present

## 2024-06-10 DIAGNOSIS — W5503XA Scratched by cat, initial encounter: Secondary | ICD-10-CM | POA: Diagnosis not present

## 2024-06-10 DIAGNOSIS — S80811D Abrasion, right lower leg, subsequent encounter: Secondary | ICD-10-CM | POA: Diagnosis not present

## 2024-06-10 DIAGNOSIS — F17200 Nicotine dependence, unspecified, uncomplicated: Secondary | ICD-10-CM | POA: Diagnosis not present

## 2024-06-10 DIAGNOSIS — E7800 Pure hypercholesterolemia, unspecified: Secondary | ICD-10-CM | POA: Diagnosis not present

## 2024-06-17 DIAGNOSIS — W5503XA Scratched by cat, initial encounter: Secondary | ICD-10-CM | POA: Diagnosis not present

## 2024-06-17 DIAGNOSIS — S80811A Abrasion, right lower leg, initial encounter: Secondary | ICD-10-CM | POA: Diagnosis not present

## 2024-06-17 DIAGNOSIS — L539 Erythematous condition, unspecified: Secondary | ICD-10-CM | POA: Diagnosis not present

## 2024-07-01 ENCOUNTER — Ambulatory Visit: Admitting: Acute Care

## 2024-07-02 ENCOUNTER — Ambulatory Visit
Admission: RE | Admit: 2024-07-02 | Discharge: 2024-07-02 | Disposition: A | Discharge status: Home / Self Care | Source: Ambulatory Visit | Attending: Acute Care | Admitting: Acute Care

## 2024-07-02 DIAGNOSIS — R911 Solitary pulmonary nodule: Secondary | ICD-10-CM

## 2024-07-02 DIAGNOSIS — J439 Emphysema, unspecified: Secondary | ICD-10-CM | POA: Diagnosis not present

## 2024-07-02 DIAGNOSIS — I700 Atherosclerosis of aorta: Secondary | ICD-10-CM | POA: Diagnosis not present

## 2024-07-25 DIAGNOSIS — H2513 Age-related nuclear cataract, bilateral: Secondary | ICD-10-CM | POA: Diagnosis not present

## 2024-07-25 DIAGNOSIS — H25013 Cortical age-related cataract, bilateral: Secondary | ICD-10-CM | POA: Diagnosis not present

## 2024-07-25 DIAGNOSIS — H5203 Hypermetropia, bilateral: Secondary | ICD-10-CM | POA: Diagnosis not present

## 2024-07-25 DIAGNOSIS — H35372 Puckering of macula, left eye: Secondary | ICD-10-CM | POA: Diagnosis not present

## 2024-07-29 ENCOUNTER — Encounter: Payer: Self-pay | Admitting: Acute Care

## 2024-07-29 ENCOUNTER — Ambulatory Visit: Admitting: Acute Care

## 2024-07-29 VITALS — BP 168/78 | HR 6 | Temp 97.4°F | Ht 65.0 in | Wt 135.8 lb

## 2024-07-29 DIAGNOSIS — R9389 Abnormal findings on diagnostic imaging of other specified body structures: Secondary | ICD-10-CM | POA: Diagnosis not present

## 2024-07-29 DIAGNOSIS — F17210 Nicotine dependence, cigarettes, uncomplicated: Secondary | ICD-10-CM | POA: Diagnosis not present

## 2024-07-29 DIAGNOSIS — Z720 Tobacco use: Secondary | ICD-10-CM

## 2024-07-29 DIAGNOSIS — J42 Unspecified chronic bronchitis: Secondary | ICD-10-CM | POA: Diagnosis not present

## 2024-07-29 DIAGNOSIS — R911 Solitary pulmonary nodule: Secondary | ICD-10-CM

## 2024-07-29 NOTE — Patient Instructions (Addendum)
 It is good to see you today. Tour CT shows the lung nodule we have been following is stable in size We will do another 3 month follow up scan to ensure stability. If it is stable after the next scan we can consider spacing out the scans.  Call for any blood in your sputum, and call for continued weight loss.  Consider adding boost or Ensure shakes to your daily routine as a nutritional supplement.  Call if you need us  sooner, Please contact office for sooner follow up if symptoms do not improve or worsen or seek emergency care   You can receive free nicotine replacement therapy (patches, gum, or mints) by calling 1-800-QUIT NOW. Please call so we can get you on the path to becoming a non-smoker. I know it is hard, but you can do this!  Hypnosis for smoking cessation  [employer] [phone]  Acupuncture for smoking cessation  [employer] [phone]

## 2024-07-29 NOTE — Progress Notes (Signed)
 History of Present Illness Karen Bonilla is a 83 y.o. female active smoker (67 pack years) with coronary artery disease, hyperlipidemia, multiple basal cell and squamous cell carcinomas that have been resected, lumbar sciatica, anemia, diverticular disease. She is followed for an abnormal pulmonary nodule by Dr. Shelah.  07/29/2024 Discussed the use of AI scribe software for clinical note transcription with the patient, who gave verbal consent to proceed.  History of Present Illness Karen Bonilla is an 83 year old female who presents for follow-up of a subpleural nodule.She was referred to Dr. Shelah 08/2023. A right lower lobe nodule had increased in size compared to 12/2021. Additionally there is a new 3 mm left upper lobe nodule.  PET scan was done which showed mild hypermetabolism to the medial right lower lobe nodule with an SUV max of 3.6 . There was no hypermetabolic lymphadenopathy or evidence of any other disease.  Because of these nodule  changes patient underwent bronchoscopy with biopsies December 31, 2023.  The biopsies of the right lower lobe were negative for malignant cells.  Plan after the January 08, 2024 visit was for a 71-month follow-up CT to closely with observe the behavior of the nodule.  At that time the nodule was grossly stable but continued to be appear aggressive in its nature.  Recommendation was for continued close observation.  Plan was for a 13-month follow-up CT scan due in August 2025.  Patient is here today to review those results.  The subpleural nodule in the medial basal right lower lobe measures 2.8 x 1.5 x 2.3 cm, showing a slight decrease in size from previous measurements.  She smokes less than a pack a day, primarily outdoors, and does not inhale.  She has no new symptoms, no unexplained weight loss or hemoptysis.  She has lost about 5 pounds since she was here 3 months ago.  Previous attempts at smoking cessation through hypnosis resulted in her not smoking indoors,  but she continues to smoke.  Her weight has fluctuated, currently at 135 pounds 12 ounces, down from 140 pounds in May and 138 pounds in January, due to decreased appetite and reduced food intake. She does not use protein supplements.  I have encouraged her to add boost or Ensure to her diet.  Plan will be for another 68-month follow-up CT scan to ensure stability of the pulmonary nodules under observation.  If the neck scan is stable we will consider spacing the surveillance scans out.  She knows to call me for any blood in her sputum or unexplained weight loss.  I have counseled her again to quit smoking.  See after visit summary for resources provided.    Test Results: CT Chest 07/02/2024 IMPRESSION: 1. Mild to moderate centrilobular emphysema. Otherwise, no acute intrathoracic abnormality. Specifically, no pneumonia, pulmonary edema, or pleural effusion. 2. Similar appearance of the subpleural nodule in the medial basal right lower lobe, measuring 2.8 x 1.5 x 2.3 cm (previously, 2.9 x 1.4 x 2.6 cm). No intrathoracic lymphadenopathy.  Cytology 12/31/2023 A. LUNG, RLL, FINE NEEDLE ASPIRATION  BIOPSIES:  - No malignant cells identified  - Non- necrotizing granulomas and chronic inflammation   B. LUNG, RLL, BRUSHINGS:  - No malignant cells identified   CT Chest 04/09/2024 read 04/23/2024  Grossly stable size and appearance of aggressive appearing nodule in the medial right lower lobe, as above. Continued attention on follow-up studies is recommended to ensure stability or regression. 2. Mild diffuse bronchial wall thickening with mild  centrilobular and paraseptal emphysema; imaging findings suggestive of underlying COPD. 3. Aortic atherosclerosis, in addition to left main and three-vessel coronary artery disease. There are calcifications of the aortic valve. Echocardiographic correlation for evaluation of potential valvular dysfunction may be warranted if clinically indicated.      Latest Ref Rng & Units 12/31/2023    7:05 AM 01/09/2022    2:06 PM  CBC  WBC 4.0 - 10.5 K/uL 4.7  6.3   Hemoglobin 12.0 - 15.0 g/dL 85.3  85.5   Hematocrit 36.0 - 46.0 % 44.1  43.9   Platelets 150 - 400 K/uL 248  288        Latest Ref Rng & Units 12/31/2023    7:05 AM  BMP  Glucose 70 - 99 mg/dL 85   BUN 8 - 23 mg/dL 15   Creatinine 9.55 - 1.00 mg/dL 9.16   Sodium 864 - 854 mmol/L 137   Potassium 3.5 - 5.1 mmol/L 4.1   Chloride 98 - 111 mmol/L 104   CO2 22 - 32 mmol/L 23   Calcium 8.9 - 10.3 mg/dL 9.3     BNP No results found for: BNP  ProBNP No results found for: PROBNP  PFT No results found for: FEV1PRE, FEV1POST, FVCPRE, FVCPOST, TLC, DLCOUNC, PREFEV1FVCRT, PSTFEV1FVCRT  CT CHEST WO CONTRAST Result Date: 07/05/2024 CLINICAL DATA:  Lung nodule, > 8mm EXAM: CT CHEST WITHOUT CONTRAST TECHNIQUE: Multidetector CT imaging of the chest was performed following the standard protocol without IV contrast. RADIATION DOSE REDUCTION: This exam was performed according to the departmental dose-optimization program which includes automated exposure control, adjustment of the mA and/or kV according to patient size and/or use of iterative reconstruction technique. COMPARISON:  April 09, 2024, November 05, 2023, September 24, 2023, January 02, 2022 FINDINGS: Cardiovascular: No cardiomegaly or pericardial effusion. No aortic aneurysm. Dense multi-vessel coronary atherosclerosis. Diffuse calcified aortic atherosclerosis, including at the ostia of the great vessels. Mediastinum/Nodes: No mediastinal mass. No mediastinal, hilar, or axillary lymphadenopathy. Lungs/Pleura: The midline trachea and bronchi are patent. Biapical pleuroparenchymal scarring. Mild to moderate centrilobular emphysema. No focal airspace consolidation, pleural effusion, or pneumothorax. Subpleural nodule in the medial basal right lower lobe, measuring 2.8 x 1.5 x 2.3 cm (APxTRxCC). Previously, this measured 2.9 x 1.4  x 2.6 cm. There is an adjacent fiducial marker again noted. Musculoskeletal: No acute fracture or destructive bone lesion. Multilevel degenerative disc disease of the spine. Thoracic DISH. Upper Abdomen: No acute abnormality in the partially visualized upper abdomen. IMPRESSION: 1. Mild to moderate centrilobular emphysema. Otherwise, no acute intrathoracic abnormality. Specifically, no pneumonia, pulmonary edema, or pleural effusion. 2. Similar appearance of the subpleural nodule in the medial basal right lower lobe, measuring 2.8 x 1.5 x 2.3 cm (previously, 2.9 x 1.4 x 2.6 cm). No intrathoracic lymphadenopathy. Aortic Atherosclerosis (ICD10-I70.0) and Emphysema (ICD10-J43.9). Electronically Signed   By: Rogelia Myers M.D.   On: 07/05/2024 19:28     Past medical hx Past Medical History:  Diagnosis Date   Abnormal chest CT    Anemia    Anosmia    Aortic atherosclerosis (HCC)    Arthritis    Back pain of lumbar region with sciatica    Basal cell carcinoma 09/01/2002   right shin tx exc   BCC (basal cell carcinoma of skin) 06/20/2013   right forearm TX WITH BX   BCC (basal cell carcinoma of skin) 06/20/2013   right upper lip TX WITH BX   BCC (basal cell carcinoma of skin) 10/03/2016  mid upper back TX WITH BX   CAD (coronary artery disease)    Colon polyps    COPD (chronic obstructive pulmonary disease) (HCC)    DDD (degenerative disc disease), lumbar    Diverticulosis large intestine w/o perforation or abscess w/o bleeding    Emphysema, unspecified (HCC)    History of colonic polyps    Hypercholesterolemia    Hyperlipidemia    Inguinal hernia    Mucoid cyst of joint    SCC (squamous cell carcinoma) 01/23/2011   left forearm scc/ka  TX CX3 5FU CAUTERY   SCC (squamous cell carcinoma) 10/03/2016   right shin scc/ka TX CX3 5FU CAUTERY   SCC (squamous cell carcinoma) 07/29/2019   left forehead scc TX CX3 5FU CAUTERY   Scoliosis of lumbar spine, unspecified scoliosis type     Seasonal allergies    Senile purpura (HCC)    Spinal stenosis    Spinal stenosis    Squamous cell carcinoma of skin 10/23/2005   left lower shin inf. bowens  TX CX3 5FU CAUTERY   Squamous cell carcinoma of skin 08/06/2020   in situ- right lower leg ([id number])   Squamous cell carcinoma of skin 08/06/2020   in situ- right lower leg- posterior lower ([id number])   Varicose veins of lower extremities with other complications 05/21/2012     Social History   Tobacco Use   Smoking status: Every Day    Current packs/day: 1.00    Average packs/day: 1 pack/day for 67.6 years (67.6 ttl pk-yrs)    Types: Cigarettes    Start date: 1958   Smokeless tobacco: Never   Tobacco comments:    Smokes 1 pack a day depends on the day  07/29/2024 KRD  Vaping Use   Vaping status: Never Used  Substance Use Topics   Alcohol  use: Yes    Alcohol /week: 7.0 standard drinks of alcohol     Types: 7 Glasses of wine per week    Comment: WINE NIGHTLY WITH DINNER   Drug use: No    Ms.Spellman reports that she has been smoking cigarettes. She started smoking about 67 years ago. She has a 67.6 pack-year smoking history. She has never used smokeless tobacco. She reports current alcohol  use of about 7.0 standard drinks of alcohol  per week. She reports that she does not use drugs.  Tobacco Cessation: Ready to quit: Not Answered Counseling given: Not Answered Tobacco comments: Smokes 1 pack a day depends on the day  07/29/2024 KRD Current every day smoker , I spent 3-4 minutes counseling patient on  steps to stop use of tobacco products. I have provided patient with information on receiving free nicotine replacement therapy, and contact numbers for hypnosis for smoking cessation as well as acupuncture for smoking cessation.   Past surgical hx, Family hx, Social hx all reviewed.  Current Outpatient Medications on File Prior to Visit  Medication Sig   acetaminophen  (TYLENOL  8 HOUR) 650 MG CR tablet 1-2 tablets Orally twice a day  (scheduled for pain) OTC   albuterol  (PROVENTIL  HFA;VENTOLIN  HFA) 108 (90 BASE) MCG/ACT inhaler Inhale into the lungs every 6 (six) hours as needed for wheezing or shortness of breath.   calcium carbonate (TUMS - DOSED IN MG ELEMENTAL CALCIUM) 500 MG chewable tablet Chew 1 tablet by mouth daily as needed.   cholecalciferol  (VITAMIN D ) 25 MCG (1000 UNIT) tablet Take 1,000 Units by mouth at bedtime.   Polyethyl Glycol-Propyl Glycol (SYSTANE) 0.4-0.3 % SOLN Place 1-2 drops into both eyes 2 (two)  times daily as needed (dry/irritated eyes.).   pravastatin  (PRAVACHOL ) 20 MG tablet Take 20 mg by mouth at bedtime.   traMADol  (ULTRAM ) 50 MG tablet Take 1 tablet (50 mg total) by mouth every 6 (six) hours as needed for moderate pain or severe pain.   alendronate (FOSAMAX) 70 MG tablet Take 70 mg by mouth once a week. Takes on Sunday (Patient not taking: Reported on 07/29/2024)   No current facility-administered medications on file prior to visit.     No Known Allergies  Review Of Systems:  Constitutional:   +  weight loss, No night sweats,  Fevers, chills, fatigue, or  lassitude.  HEENT:   No headaches,  Difficulty swallowing,  Tooth/dental problems, or  Sore throat,                No sneezing, itching, ear ache, nasal congestion, post nasal drip,   CV:  No chest pain,  Orthopnea, PND, swelling in lower extremities, anasarca, dizziness, palpitations, syncope.   GI  No heartburn, indigestion, abdominal pain, nausea, vomiting, diarrhea, change in bowel habits, loss of appetite, bloody stools.   Resp: No shortness of breath with exertion or at rest.  No excess mucus, no productive cough,  No non-productive cough,  No coughing up of blood.  No change in color of mucus.  No wheezing.  No chest wall deformity  Skin: no rash or lesions.  GU: no dysuria, change in color of urine, no urgency or frequency.  No flank pain, no hematuria   MS:  No joint pain or swelling.  No decreased range of motion.  No back  pain.  Psych:  No change in mood or affect. No depression or anxiety.  No memory loss.   Vital Signs BP (!) 168/78   Pulse (!) 6   Temp (!) 97.4 F (36.3 C) (Oral)   Ht 5' 5 (1.651 m)   Wt 135 lb 12.8 oz (61.6 kg)   SpO2 96%   BMI 22.60 kg/m    Physical Exam:  General- No distress,  A&Ox3, pleasant and appropriate ENT: No sinus tenderness, TM clear, pale nasal mucosa, no oral exudate,no post nasal drip, no LAN Cardiac: S1, S2, regular rate and rhythm, no murmur Chest: No wheeze/ rales/ dullness; no accessory muscle use, no nasal flaring, no sternal retractions, slightly diminished per bases Abd.: Soft Non-tender, nondistended, bowel sounds positive,Body mass index is 22.6 kg/m.  Ext: No clubbing cyanosis, edema, no obvious deformities Neuro:  normal strength, moving all extremities x 4, alert and oriented x 3 Skin: No rashes, warm and dry, no obvious skin lesions Psych: normal mood and behavior    Assessment & Plan Stable right lower lobe subpleural lung nodule The nodule in the medial basal right lower lobe measures 2.8 x 1.5 x 2.3 cm, showing slight dimensional changes but indicating stability. - Schedule follow-up CT scan in three months to monitor the nodule. - Instruct to report any hemoptysis or continued weight loss before the next scheduled appointment.  Unintentional weight loss Weight decreased from 140 lbs in May to 135 lbs currently, fluctuating between 135 and 140 lbs. The weight loss is unintentional, attributed to decreased appetite and dietary intake, raising concern in the context of the lung nodule. - Encourage the use of protein supplements like Ensure or Boost to increase caloric intake. - Advise to consider healthier meal options from [employer]. - Monitor weight and report any further unintentional weight loss.  Tobacco use disorder Continues to smoke less  than a pack a day, primarily outdoors. Previous cessation attempts through hypnosis were  partially successful. - Encourage smoking cessation and discuss nicotine replacement therapy options. - Suggest revisiting hypnosis as a potential aid for smoking cessation. You can receive free nicotine replacement therapy (patches, gum, or mints) by calling 1-800-QUIT NOW. Please call so we can get you on the path to becoming a non-smoker. I know it is hard, but you can do this!  Hypnosis for smoking cessation  [employer] [phone]  Acupuncture for smoking cessation  [employer] [phone]    I spent 25 minutes dedicated to the care of this patient on the date of this encounter to include pre-visit review of records, face-to-face time with the patient discussing conditions above, post visit ordering of testing, clinical documentation with the electronic health record, making appropriate referrals as documented, and communicating necessary information to the patient's healthcare team.     Lauraine JULIANNA Lites, NP 07/29/2024  11:14 AM

## 2024-08-14 DIAGNOSIS — Z1331 Encounter for screening for depression: Secondary | ICD-10-CM | POA: Diagnosis not present

## 2024-08-14 DIAGNOSIS — Z0000 Encounter for general adult medical examination without abnormal findings: Secondary | ICD-10-CM | POA: Diagnosis not present

## 2024-08-19 DIAGNOSIS — T7589XA Other specified effects of external causes, initial encounter: Secondary | ICD-10-CM | POA: Diagnosis not present

## 2024-08-19 DIAGNOSIS — J439 Emphysema, unspecified: Secondary | ICD-10-CM | POA: Diagnosis not present

## 2024-08-19 DIAGNOSIS — M545 Low back pain, unspecified: Secondary | ICD-10-CM | POA: Diagnosis not present

## 2024-08-19 DIAGNOSIS — J449 Chronic obstructive pulmonary disease, unspecified: Secondary | ICD-10-CM | POA: Diagnosis not present

## 2024-08-19 DIAGNOSIS — F17200 Nicotine dependence, unspecified, uncomplicated: Secondary | ICD-10-CM | POA: Diagnosis not present

## 2024-09-03 DIAGNOSIS — Z1231 Encounter for screening mammogram for malignant neoplasm of breast: Secondary | ICD-10-CM | POA: Diagnosis not present

## 2024-09-24 DIAGNOSIS — R1031 Right lower quadrant pain: Secondary | ICD-10-CM | POA: Diagnosis not present

## 2024-09-24 DIAGNOSIS — C44629 Squamous cell carcinoma of skin of left upper limb, including shoulder: Secondary | ICD-10-CM | POA: Diagnosis not present

## 2024-09-24 DIAGNOSIS — Z85828 Personal history of other malignant neoplasm of skin: Secondary | ICD-10-CM | POA: Diagnosis not present

## 2024-09-24 DIAGNOSIS — R109 Unspecified abdominal pain: Secondary | ICD-10-CM | POA: Diagnosis not present

## 2024-09-24 DIAGNOSIS — L820 Inflamed seborrheic keratosis: Secondary | ICD-10-CM | POA: Diagnosis not present

## 2024-09-24 DIAGNOSIS — L570 Actinic keratosis: Secondary | ICD-10-CM | POA: Diagnosis not present

## 2024-11-10 ENCOUNTER — Ambulatory Visit
Admission: RE | Admit: 2024-11-10 | Discharge: 2024-11-10 | Disposition: A | Discharge status: Home / Self Care | Source: Ambulatory Visit | Attending: Acute Care | Admitting: Acute Care

## 2024-11-10 DIAGNOSIS — R911 Solitary pulmonary nodule: Secondary | ICD-10-CM

## 2024-11-10 DIAGNOSIS — J439 Emphysema, unspecified: Secondary | ICD-10-CM | POA: Diagnosis not present

## 2024-11-25 ENCOUNTER — Ambulatory Visit: Admitting: Acute Care

## 2024-11-25 ENCOUNTER — Encounter: Payer: Self-pay | Admitting: Acute Care

## 2024-11-25 VITALS — BP 145/82 | HR 62 | Temp 98.2°F | Ht 66.0 in | Wt 139.0 lb

## 2024-11-25 DIAGNOSIS — R9389 Abnormal findings on diagnostic imaging of other specified body structures: Secondary | ICD-10-CM

## 2024-11-25 DIAGNOSIS — F17200 Nicotine dependence, unspecified, uncomplicated: Secondary | ICD-10-CM | POA: Diagnosis not present

## 2024-11-25 DIAGNOSIS — J449 Chronic obstructive pulmonary disease, unspecified: Secondary | ICD-10-CM

## 2024-11-25 DIAGNOSIS — R911 Solitary pulmonary nodule: Secondary | ICD-10-CM | POA: Diagnosis not present

## 2024-11-25 MED ORDER — ALBUTEROL SULFATE HFA 108 (90 BASE) MCG/ACT IN AERS: 1.0000 | INHALATION_SPRAY | Freq: Four times a day (QID) | RESPIRATORY_TRACT | 3 refills | Status: AC | PRN

## 2024-11-25 NOTE — Progress Notes (Signed)
 History of Present Illness Karen Bonilla is a 83 y.o. female active smoker (67 pack years) with coronary artery disease, hyperlipidemia, multiple basal cell and squamous cell carcinomas that have been resected, lumbar sciatica, anemia, diverticular disease. She is followed for an abnormal pulmonary nodule by Dr. Shelah.   Synopsis Karen Bonilla is an 83 year old female who presents for follow-up of a subpleural nodule.She was referred to Dr. Shelah 08/2023. A right lower lobe nodule had increased in size compared to 12/2021. Additionally there is a new 3 mm left upper lobe nodule.  PET scan was done which showed mild hypermetabolism to the medial right lower lobe nodule with an SUV max of 3.6 . There was no hypermetabolic lymphadenopathy or evidence of any other disease.  Because of these nodule  changes patient underwent bronchoscopy with biopsies December 31, 2023.  The biopsies of the right lower lobe were negative for malignant cells.  Plan after the January 08, 2024 visit was for a 48-month follow-up CT to closely with observe the behavior of the nodule.  At that time the nodule was grossly stable but continued to be appear aggressive in its nature.  Recommendation was for continued close observation. She has had follow up imaging 03/2024, 06/2024, and now 10/2024. She is here today  to review her most recent scan.    11/25/2024 Discussed the use of AI scribe software for clinical note transcription with the patient, who gave verbal consent to proceed.  History of Present Illness Karen Bonilla is an 83 year old female who presents for follow-up of a pulmonary  nodule that is under surveillance. .  The pulmonary nodule in the right medial lower lobe has been under surveillance, currently measuring 13 by 29 mm, slightly decreased from a previous measurement of 14 by 29 mm.  We have reviewed her CT Chest. As the nodule remains stable, and radiology recommend a 12 month follow up scan, we will do an annual  scan 10/2025 to maintain surveillance. I have asked her to call to be seen sooner for any unexplained weight loss or hemoptysis. She verbalized understanding,   Her weight has been stable, currently at 139 pounds, up from 135 pounds at the last visit. This is reassuring. She denies any hemoptysis.   She continues to smoke but only does so outside and has stopped inhaling. She uses an albuterol  inhaler infrequently, approximately once every one to two months for exertional dyspnea, and notes that her current inhaler is expired. She does endorse occasional wheezing.   We discussed the need to quit smoking completely. She was provided with resources to help her quit. See AVS.        Test Results: CT Chest 11/13/2024 Stable right medial lower lobe subpleural part-solid/solid pulmonary nodule with adjacent fiducial marker, measuring 13 x 29 mm, stable since 09/24/2023. Follow-up evaluation in 1 year is recommended to continue document continued stability. 2. Moderate emphysema; given emphysema is an independent risk factor for lung cancer and assuming age 74, consider evaluation for low-dose CT lung cancer screening program if not already enrolled>> Pt is out of age range for Lung Cancer Screening 3. Extensive multivessel coronary artery calcifications       Latest Ref Rng & Units 12/31/2023    7:05 AM 01/09/2022    2:06 PM  CBC  WBC 4.0 - 10.5 K/uL 4.7  6.3   Hemoglobin 12.0 - 15.0 g/dL 85.3  85.5   Hematocrit 36.0 - 46.0 % 44.1  43.9   Platelets 150 - 400 K/uL 248  288        Latest Ref Rng & Units 12/31/2023    7:05 AM  BMP  Glucose 70 - 99 mg/dL 85   BUN 8 - 23 mg/dL 15   Creatinine 9.55 - 1.00 mg/dL 9.16   Sodium 864 - 854 mmol/L 137   Potassium 3.5 - 5.1 mmol/L 4.1   Chloride 98 - 111 mmol/L 104   CO2 22 - 32 mmol/L 23   Calcium 8.9 - 10.3 mg/dL 9.3     BNP No results found for: BNP  ProBNP No results found for: PROBNP  PFT No results found for: FEV1PRE,  FEV1POST, FVCPRE, FVCPOST, TLC, DLCOUNC, PREFEV1FVCRT, PSTFEV1FVCRT  CT Chest Wo Contrast Result Date: 11/13/2024 EXAM: CT CHEST WITHOUT CONTRAST 11/10/2024 10:29:45 AM TECHNIQUE: CT of the chest was performed without the administration of intravenous contrast. Multiplanar reformatted images are provided for review. Automated exposure control, iterative reconstruction, and/or weight based adjustment of the mA/kV was utilized to reduce the radiation dose to as low as reasonably achievable. COMPARISON: None available. CLINICAL HISTORY: Lung nodule, > 8mm Lung nodule, > 8mm FINDINGS: MEDIASTINUM: Heart: Extensive multivessel coronary artery calcifications. Global cardiac size within normal limits. No pericardial effusion. Vessels: Central pulmonary arteries are of normal caliber. Extensive bulky atherosclerotic calcification within the thoracic aorta. No aortic aneurysm. Airways: The central airways are clear. LYMPH NODES: No mediastinal, hilar or axillary lymphadenopathy. LUNGS AND PLEURA: Right medial lower lobe subpleural pulmonary nodule demonstrating an adjacent fiducial marker measures 13 x 29 mm on image 121/8, stable. Moderate emphysema. Stable pulmonary hyperinflation. No new focal pulmonary nodules or infiltrates. No focal consolidation or pulmonary edema. No pleural effusion or pneumothorax. SOFT TISSUES/BONES: Osseous structures are age appropriate. No acute bone abnormality. No lytic or blastic bone lesion. No acute abnormality of the soft tissues. UPPER ABDOMEN: Limited images of the upper abdomen demonstrates no acute abnormality. IMPRESSION: 1. Stable right medial lower lobe subpleural part-solid/solid pulmonary nodule with adjacent fiducial marker, measuring 13 x 29 mm, stable since 09/24/2023. Follow-up evaluation in 1 year is recommended to continue document continued stability. 2. Moderate emphysema; given emphysema is an independent risk factor for lung cancer and assuming age  43, consider evaluation for low-dose CT lung cancer screening program if not already enrolled 3. Extensive multivessel coronary artery calcifications Electronically signed by: Dorethia Molt MD 11/13/2024 02:07 AM EST RP Workstation: [id number]     Past medical hx Past Medical History:  Diagnosis Date   Abnormal chest CT    Anemia    Anosmia    Aortic atherosclerosis    Arthritis    Back pain of lumbar region with sciatica    Basal cell carcinoma 09/01/2002   right shin tx exc   BCC (basal cell carcinoma of skin) 06/20/2013   right forearm TX WITH BX   BCC (basal cell carcinoma of skin) 06/20/2013   right upper lip TX WITH BX   BCC (basal cell carcinoma of skin) 10/03/2016   mid upper back TX WITH BX   CAD (coronary artery disease)    Colon polyps    COPD (chronic obstructive pulmonary disease) (HCC)    DDD (degenerative disc disease), lumbar    Diverticulosis large intestine w/o perforation or abscess w/o bleeding    Emphysema, unspecified (HCC)    History of colonic polyps    Hypercholesterolemia    Hyperlipidemia    Inguinal hernia    Mucoid cyst of joint  SCC (squamous cell carcinoma) 01/23/2011   left forearm scc/ka  TX CX3 5FU CAUTERY   SCC (squamous cell carcinoma) 10/03/2016   right shin scc/ka TX CX3 5FU CAUTERY   SCC (squamous cell carcinoma) 07/29/2019   left forehead scc TX CX3 5FU CAUTERY   Scoliosis of lumbar spine, unspecified scoliosis type    Seasonal allergies    Senile purpura    Spinal stenosis    Spinal stenosis    Squamous cell carcinoma of skin 10/23/2005   left lower shin inf. bowens  TX CX3 5FU CAUTERY   Squamous cell carcinoma of skin 08/06/2020   in situ- right lower leg (CX35FU)   Squamous cell carcinoma of skin 08/06/2020   in situ- right lower leg- posterior lower (CX35FU)   Varicose veins of lower extremities with other complications 05/21/2012     Social History   Tobacco Use   Smoking status: Every Day    Current packs/day:  1.00    Average packs/day: 1 pack/day for 67.9 years (67.9 ttl pk-yrs)    Types: Cigarettes    Start date: 1958   Smokeless tobacco: Never   Tobacco comments:    Smokes 1 pack a day depends on the day 11/25/2024 KRD  Vaping Use   Vaping status: Never Used  Substance Use Topics   Alcohol  use: Yes    Alcohol /week: 7.0 standard drinks of alcohol     Types: 7 Glasses of wine per week    Comment: WINE NIGHTLY WITH DINNER   Drug use: No    Ms.Parady reports that she has been smoking cigarettes. She started smoking about 67 years ago. She has a 67.9 pack-year smoking history. She has never used smokeless tobacco. She reports current alcohol  use of about 7.0 standard drinks of alcohol  per week. She reports that she does not use drugs.  Tobacco Cessation: Ready to quit: Not Answered Counseling given: Not Answered Tobacco comments: Smokes 1 pack a day depends on the day 11/25/2024 KRD Current every day smoker , 1 PPD, with a 68 pack year smoking history  Past surgical hx, Family hx, Social hx all reviewed.  Current Outpatient Medications on File Prior to Visit  Medication Sig   acetaminophen  (TYLENOL  8 HOUR) 650 MG CR tablet 1-2 tablets Orally twice a day (scheduled for pain) OTC   albuterol  (PROVENTIL  HFA;VENTOLIN  HFA) 108 (90 BASE) MCG/ACT inhaler Inhale into the lungs every 6 (six) hours as needed for wheezing or shortness of breath.   calcium carbonate (TUMS - DOSED IN MG ELEMENTAL CALCIUM) 500 MG chewable tablet Chew 1 tablet by mouth daily as needed.   cholecalciferol  (VITAMIN D ) 25 MCG (1000 UNIT) tablet Take 1,000 Units by mouth at bedtime.   Polyethyl Glycol-Propyl Glycol (SYSTANE) 0.4-0.3 % SOLN Place 1-2 drops into both eyes 2 (two) times daily as needed (dry/irritated eyes.).   pravastatin  (PRAVACHOL ) 20 MG tablet Take 20 mg by mouth at bedtime.   traMADol  (ULTRAM ) 50 MG tablet Take 1 tablet (50 mg total) by mouth every 6 (six) hours as needed for moderate pain or severe pain.    No current facility-administered medications on file prior to visit.     No Known Allergies  Review Of Systems:  Constitutional:   No  weight loss, night sweats,  Fevers, chills, fatigue, or  lassitude.  HEENT:   No headaches,  Difficulty swallowing,  Tooth/dental problems, or  Sore throat,                No sneezing,  itching, ear ache, nasal congestion, post nasal drip,   CV:  No chest pain,  Orthopnea, PND, swelling in lower extremities, anasarca, dizziness, palpitations, syncope.   GI  No heartburn, indigestion, abdominal pain, nausea, vomiting, diarrhea, change in bowel habits, loss of appetite, bloody stools.   Resp: No shortness of breath with exertion or at rest.  No excess mucus, no productive cough,  No non-productive cough,  No coughing up of blood.  No change in color of mucus.  No wheezing.  No chest wall deformity  Skin: no rash or lesions.  GU: no dysuria, change in color of urine, no urgency or frequency.  No flank pain, no hematuria   MS:  No joint pain or swelling.  No decreased range of motion.  No back pain.  Psych:  No change in mood or affect. No depression or anxiety.  No memory loss.   Vital Signs BP (!) 145/82   Pulse 62   Temp 98.2 F (36.8 C) (Oral)   Ht 5' 6 (1.676 m)   Wt 139 lb (63 kg)   SpO2 99%   BMI 22.44 kg/m    Physical Exam MEASUREMENTS: Weight- 139. GENERAL: No distress, alert and oriented times 3. EARS NOSE THROAT: No sinus tenderness, tympanic membranes clear, pale nasal mucosa, no oral exudate, no post nasal drip, no lymphadenopathy. CHEST: Wheezing on auscultation, no rales, dullness, no accessory muscle use, no nasal flaring, no sternal retractions. CARDIAC: S1, S2, regular rate and rhythm, no murmur. ABDOMINAL: Soft, non tender. ND, BS present. EXTREMITIES: No clubbing, cyanosis, edema. No obvious deformities. NEUROLOGICAL: Normal strength. Alert and oriented x 3, MAE x 4. SKIN: No rashes, warm and dry. No obvious skin  lesions. PSYCHIATRIC: Normal mood and behavior.   Assessment/Plan  Assessment and Plan Assessment & Plan Stable right medial lower lobe part-solid pulmonary nodule Nodule decreased in size, indicating stability. No immediate intervention required. - Scheduled follow-up CT scan in one year. - Instructed to report unintentional weight loss, hemoptysis, or upper respiratory infection symptoms.  Chronic obstructive pulmonary disease COPD well-managed with rare albuterol  use.  Smoking cessation may improve symptoms. - Renewed albuterol  inhaler prescription. - Encouraged smoking cessation efforts.  AVS 11/25/2024 Your CT shows the nodule we have been monitoring is stable. This is good news. We will do a one year follow up to maintain surveillance.  This will be due 10/2025. You will get a call closer to the time to schedule the scan. You will follow up with me after the scan to review results.  Call for any unintentional weight loss of blood in sputum so we can see you sooner.  Call for any upper respiratory infection as needed. We have renewed your albuterol .  Use as needed , 1-2 puffs as needed for shortness of breath or wheezing.  Follow up with Dr. Shelah 12/2024 as is scheduled. Call if you need us  sooner Please work on quitting smoking. You can receive free nicotine replacement therapy (patches, gum, or mints) by calling 1-800-QUIT NOW. Please call so we can get you on the path to becoming a non-smoker. I know it is hard, but you can do this!  Hypnosis for smoking cessation  [employer] [phone]  Acupuncture for smoking cessation  [employer] [phone]   Please contact office for sooner follow up if symptoms do not improve or worsen or seek emergency care     I spent 20 minutes dedicated to the care of this patient on the date  of this encounter to include pre-visit review of records, face-to-face time with the patient discussing conditions  above, post visit ordering of testing, clinical documentation with the electronic health record, making appropriate referrals as documented, and communicating necessary information to the patient's healthcare team.     Lauraine JULIANNA Lites, NP 11/25/2024  11:48 AM

## 2024-11-25 NOTE — Patient Instructions (Addendum)
 It is good to see you today. Your CT shows the nodule we have been monitoring is stable. This is good news. We will do a one year follow up to maintain surveillance.  This will be due 10/2025. You will get a call closer to the time to schedule the scan. You will follow up with me after the scan to review results.  Call for any unintentional weight loss of blood in sputum so we can see you sooner.  Call for any upper respiratory infection as needed. We have renewed your albuterol .  Use as needed , 1-2 puffs as needed for shortness of breath or wheezing.  Follow up with Dr. Shelah 12/2024 as is scheduled. Call if you need us  sooner Please work on quitting smoking. You can receive free nicotine replacement therapy (patches, gum, or mints) by calling 1-800-QUIT NOW. Please call so we can get you on the path to becoming a non-smoker. I know it is hard, but you can do this!  Hypnosis for smoking cessation  [employer] [phone]  Acupuncture for smoking cessation  [employer] [phone]   Please contact office for sooner follow up if symptoms do not improve or worsen or seek emergency care

## 2025-01-01 ENCOUNTER — Encounter: Payer: Self-pay | Admitting: Emergency Medicine

## 2025-01-01 ENCOUNTER — Ambulatory Visit: Admitting: Emergency Medicine

## 2025-01-01 VITALS — BP 140/72 | HR 57 | Ht 66.0 in | Wt 136.2 lb

## 2025-01-01 DIAGNOSIS — F17210 Nicotine dependence, cigarettes, uncomplicated: Secondary | ICD-10-CM | POA: Diagnosis not present

## 2025-01-01 DIAGNOSIS — R918 Other nonspecific abnormal finding of lung field: Secondary | ICD-10-CM

## 2025-01-01 DIAGNOSIS — J449 Chronic obstructive pulmonary disease, unspecified: Secondary | ICD-10-CM

## 2025-01-01 DIAGNOSIS — R911 Solitary pulmonary nodule: Secondary | ICD-10-CM

## 2025-01-01 DIAGNOSIS — Z720 Tobacco use: Secondary | ICD-10-CM

## 2025-01-01 NOTE — Assessment & Plan Note (Signed)
 We reviewed your CT scans of the chest today.  Your pulmonary nodule has been stable to improved.  This is good news. We will plan to repeat your CT scan of the chest in November 2026. Follow with Dr. Shelah in November to review your CT.  Please call if you have any problems so we can see you sooner.

## 2025-01-01 NOTE — Assessment & Plan Note (Signed)
You would benefit from decreasing your cigarettes.

## 2025-01-01 NOTE — Progress Notes (Signed)
 "  Subjective:    Patient ID: Karen Bonilla, female    DOB: 1941-08-30, 84 y.o.   MRN: [id number]  HPI  ROV 01/01/2025 --84 year old woman with a history of active tobacco use (7 pack years), CAD, skin cancers (resected), diverticular disease.  I saw her to evaluate an irregular right lower lobe pulmonary nodule that was increasing in size on serial imaging.  This prompted navigational bronchoscopy 12/31/2023 which showed negative cytology.  Her nodule has been stable to slightly decreased in size on serial imaging. She feels that she is breathing ok. Good functional capacity. No real cough or sputum. She does sneeze and blow her nose a lot. Smoking 1 pk/day - no plans to decrease.   CT scan of the chest 11/10/2024 reviewed by me, showed moderate emphysema.  The medial right lower lobe subpleural pulmonary nodule measures 2.9 x 1.3 cm, stable in size.  There is an associated fiducial marker.  Review of Systems As per HPI  Past Medical History:  Diagnosis Date   Abnormal chest CT    Anemia    Anosmia    Aortic atherosclerosis    Arthritis    Back pain of lumbar region with sciatica    Basal cell carcinoma 09/01/2002   right shin tx exc   BCC (basal cell carcinoma of skin) 06/20/2013   right forearm TX WITH BX   BCC (basal cell carcinoma of skin) 06/20/2013   right upper lip TX WITH BX   BCC (basal cell carcinoma of skin) 10/03/2016   mid upper back TX WITH BX   CAD (coronary artery disease)    Colon polyps    COPD (chronic obstructive pulmonary disease) (HCC)    DDD (degenerative disc disease), lumbar    Diverticulosis large intestine w/o perforation or abscess w/o bleeding    Emphysema, unspecified (HCC)    History of colonic polyps    Hypercholesterolemia    Hyperlipidemia    Inguinal hernia    Mucoid cyst of joint    SCC (squamous cell carcinoma) 01/23/2011   left forearm scc/ka  TX CX3 5FU CAUTERY   SCC (squamous cell carcinoma) 10/03/2016   right shin scc/ka TX CX3 5FU  CAUTERY   SCC (squamous cell carcinoma) 07/29/2019   left forehead scc TX CX3 5FU CAUTERY   Scoliosis of lumbar spine, unspecified scoliosis type    Seasonal allergies    Senile purpura    Spinal stenosis    Spinal stenosis    Squamous cell carcinoma of skin 10/23/2005   left lower shin inf. bowens  TX CX3 5FU CAUTERY   Squamous cell carcinoma of skin 08/06/2020   in situ- right lower leg (CX35FU)   Squamous cell carcinoma of skin 08/06/2020   in situ- right lower leg- posterior lower (CX35FU)   Varicose veins of lower extremities with other complications 05/21/2012     Family History  Problem Relation Age of Onset   Hypertension Mother    Other Father        varicose veins   Hypertension Father      Social History   Socioeconomic History   Marital status: Divorced    Spouse name: Not on file   Number of children: Not on file   Years of education: Not on file   Highest education level: Not on file  Occupational History   Not on file  Tobacco Use   Smoking status: Every Day    Current packs/day: 1.00    Average packs/day: 1  pack/day for 68.0 years (68.0 ttl pk-yrs)    Types: Cigarettes    Start date: 1958   Smokeless tobacco: Never   Tobacco comments:    Smokes 1 pack a day depends on the day     Smokes cig without inhaler 01/02/2024  Vaping Use   Vaping status: Never Used  Substance and Sexual Activity   Alcohol  use: Yes    Alcohol /week: 7.0 standard drinks of alcohol     Types: 7 Glasses of wine per week    Comment: WINE NIGHTLY WITH DINNER   Drug use: No   Sexual activity: Not Currently    Birth control/protection: Post-menopausal  Other Topics Concern   Not on file  Social History Narrative   Not on file   Social Drivers of Health   Tobacco Use: High Risk (01/01/2025)   Patient History    Smoking Tobacco Use: Every Day    Smokeless Tobacco Use: Never    Passive Exposure: Not on file  Financial Resource Strain: Not on file  Food Insecurity: Not on file   Transportation Needs: Not on file  Physical Activity: Not on file  Stress: Not on file  Social Connections: Not on file  Intimate Partner Violence: Not on file  Depression (PHQ2-9): Not on file  Alcohol  Screen: Not on file  Housing: Not on file  Utilities: Not on file  Health Literacy: Not on file     No Known Allergies   Outpatient Medications Prior to Visit  Medication Sig Dispense Refill   acetaminophen  (TYLENOL  8 HOUR) 650 MG CR tablet 1-2 tablets Orally twice a day (scheduled for pain) OTC     albuterol  (VENTOLIN  HFA) 108 (90 Base) MCG/ACT inhaler Inhale 1-2 puffs into the lungs every 6 (six) hours as needed for wheezing or shortness of breath. 1 each 3   calcium carbonate (TUMS - DOSED IN MG ELEMENTAL CALCIUM) 500 MG chewable tablet Chew 1 tablet by mouth daily as needed.     cholecalciferol  (VITAMIN D ) 25 MCG (1000 UNIT) tablet Take 1,000 Units by mouth at bedtime.     Polyethyl Glycol-Propyl Glycol (SYSTANE) 0.4-0.3 % SOLN Place 1-2 drops into both eyes 2 (two) times daily as needed (dry/irritated eyes.).     pravastatin  (PRAVACHOL ) 20 MG tablet Take 20 mg by mouth at bedtime.     traMADol  (ULTRAM ) 50 MG tablet Take 1 tablet (50 mg total) by mouth every 6 (six) hours as needed for moderate pain or severe pain. 10 tablet 0   No facility-administered medications prior to visit.        Objective:   Physical Exam  Vitals:   01/01/25 1545 01/01/25 1548  BP: (!) 168/101 (!) 140/72  Pulse: (!) 57   SpO2: 95%   Weight: 136 lb 3.2 oz (61.8 kg)   Height: 5' 6 (1.676 m)     Gen: Pleasant, thin, in no distress,  normal affect  ENT: No lesions,  mouth clear,  oropharynx clear, no postnasal drip, poor dentition  Neck: No JVD, no stridor  Lungs: No use of accessory muscles, distant but no crackles or wheezing on normal respiration, no wheeze on forced expiration  Cardiovascular: RRR, heart sounds normal, no murmur or gallops, no peripheral edema  Musculoskeletal: No  deformities, no cyanosis or clubbing  Neuro: alert, awake, non focal  Skin: Warm, no lesions or rash       Assessment & Plan:  Pulmonary nodules We reviewed your CT scans of the chest today.  Your pulmonary  nodule has been stable to improved.  This is good news. We will plan to repeat your CT scan of the chest in November 2026. Follow with Dr. Shelah in November to review your CT.  Please call if you have any problems so we can see you sooner.  Chronic obstructive pulmonary disease (HCC) Surprisingly well compensated.  She does occasionally use albuterol  but rarely.  Not on scheduled BD therapy.  Good functional capacity.  Does not exacerbate.  Would be important for her to try to stop smoking but she is not interested.  Keep your albuterol  available to use 2 puffs when needed for shortness of breath, chest tightness, wheezing.  Tobacco use You would benefit from decreasing your cigarettes.  I personally spent a total of 41 minutes in the care of the patient today including preparing to see the patient, getting/reviewing separately obtained history, performing a medically appropriate exam/evaluation, counseling and educating, placing orders, documenting clinical information in the EHR, independently interpreting results, and communicating results.   Lamar Shelah, MD, PhD 01/01/2025, 4:59 PM [facility] [phone] or if no answer before 7:00PM call [phone] For any issues after 7:00PM please call eLink [phone]   "

## 2025-01-01 NOTE — Patient Instructions (Signed)
 We reviewed your CT scans of the chest today.  Your pulmonary nodule has been stable to improved.  This is good news. We will plan to repeat your CT scan of the chest in November 2026. Keep your albuterol  available to use 2 puffs when needed for shortness of breath, chest tightness, wheezing. You would benefit from decreasing your cigarettes. Follow with Dr. Shelah in November to review your CT.  Please call if you have any problems so we can see you sooner.

## 2025-01-01 NOTE — Assessment & Plan Note (Signed)
 Surprisingly well compensated.  She does occasionally use albuterol  but rarely.  Not on scheduled BD therapy.  Good functional capacity.  Does not exacerbate.  Would be important for her to try to stop smoking but she is not interested.  Keep your albuterol  available to use 2 puffs when needed for shortness of breath, chest tightness, wheezing.

## 2025-01-30 ENCOUNTER — Other Ambulatory Visit (HOSPITAL_COMMUNITY): Payer: Self-pay | Admitting: Family Medicine

## 2025-01-30 ENCOUNTER — Ambulatory Visit (HOSPITAL_COMMUNITY): Admission: RE | Admit: 2025-01-30

## 2025-01-30 DIAGNOSIS — M79605 Pain in left leg: Secondary | ICD-10-CM

## 2025-01-30 DIAGNOSIS — M7989 Other specified soft tissue disorders: Secondary | ICD-10-CM
